# Patient Record
Sex: Female | Born: 1940 | Race: White | Hispanic: No | Marital: Married | Smoking: Never smoker
Health system: Southern US, Community
[De-identification: ages and names within clinical notes are randomized; demographics above are authoritative.]

## PROBLEM LIST (undated history)

## (undated) DIAGNOSIS — K5792 Diverticulitis of intestine, part unspecified, without perforation or abscess without bleeding: Secondary | ICD-10-CM

## (undated) DIAGNOSIS — N8110 Cystocele, unspecified: Secondary | ICD-10-CM

## (undated) DIAGNOSIS — R609 Edema, unspecified: Secondary | ICD-10-CM

## (undated) DIAGNOSIS — E785 Hyperlipidemia, unspecified: Secondary | ICD-10-CM

## (undated) DIAGNOSIS — C50919 Malignant neoplasm of unspecified site of unspecified female breast: Secondary | ICD-10-CM

## (undated) DIAGNOSIS — M75100 Unspecified rotator cuff tear or rupture of unspecified shoulder, not specified as traumatic: Secondary | ICD-10-CM

## (undated) DIAGNOSIS — I499 Cardiac arrhythmia, unspecified: Secondary | ICD-10-CM

## (undated) DIAGNOSIS — I480 Paroxysmal atrial fibrillation: Secondary | ICD-10-CM

## (undated) DIAGNOSIS — R002 Palpitations: Secondary | ICD-10-CM

## (undated) DIAGNOSIS — C439 Malignant melanoma of skin, unspecified: Secondary | ICD-10-CM

## (undated) DIAGNOSIS — E876 Hypokalemia: Secondary | ICD-10-CM

## (undated) DIAGNOSIS — I493 Ventricular premature depolarization: Secondary | ICD-10-CM

## (undated) DIAGNOSIS — Z90710 Acquired absence of both cervix and uterus: Secondary | ICD-10-CM

## (undated) DIAGNOSIS — E039 Hypothyroidism, unspecified: Secondary | ICD-10-CM

## (undated) DIAGNOSIS — M19219 Secondary osteoarthritis, unspecified shoulder: Secondary | ICD-10-CM

## (undated) DIAGNOSIS — B029 Zoster without complications: Secondary | ICD-10-CM

## (undated) DIAGNOSIS — G629 Polyneuropathy, unspecified: Secondary | ICD-10-CM

## (undated) DIAGNOSIS — A3790 Whooping cough, unspecified species without pneumonia: Secondary | ICD-10-CM

## (undated) DIAGNOSIS — R0789 Other chest pain: Secondary | ICD-10-CM

## (undated) DIAGNOSIS — C73 Malignant neoplasm of thyroid gland: Secondary | ICD-10-CM

## (undated) DIAGNOSIS — C801 Malignant (primary) neoplasm, unspecified: Secondary | ICD-10-CM

## (undated) DIAGNOSIS — K219 Gastro-esophageal reflux disease without esophagitis: Secondary | ICD-10-CM

## (undated) DIAGNOSIS — Z780 Asymptomatic menopausal state: Secondary | ICD-10-CM

## (undated) DIAGNOSIS — I10 Essential (primary) hypertension: Secondary | ICD-10-CM

## (undated) HISTORY — DX: Polyneuropathy, unspecified: G62.9

## (undated) HISTORY — DX: Essential (primary) hypertension: I10

## (undated) HISTORY — DX: Malignant neoplasm of unspecified site of unspecified female breast: C50.919

## (undated) HISTORY — DX: Edema, unspecified: R60.9

## (undated) HISTORY — DX: Hypothyroidism, unspecified: E03.9

## (undated) HISTORY — DX: Secondary osteoarthritis, unspecified shoulder: M19.219

## (undated) HISTORY — DX: Hypokalemia: E87.6

## (undated) HISTORY — DX: Malignant melanoma of skin, unspecified: C43.9

## (undated) HISTORY — DX: Asymptomatic menopausal state: Z78.0

## (undated) HISTORY — DX: Acquired absence of both cervix and uterus: Z90.710

## (undated) HISTORY — PX: COLONOSCOPY: SHX174

## (undated) HISTORY — DX: Hyperlipidemia, unspecified: E78.5

## (undated) HISTORY — DX: Zoster without complications: B02.9

## (undated) HISTORY — DX: Ventricular premature depolarization: I49.3

## (undated) HISTORY — DX: Other chest pain: R07.89

## (undated) HISTORY — DX: Diverticulitis of intestine, part unspecified, without perforation or abscess without bleeding: K57.92

## (undated) HISTORY — DX: Cardiac arrhythmia, unspecified: I49.9

## (undated) HISTORY — DX: Malignant neoplasm of thyroid gland: C73

## (undated) HISTORY — DX: Palpitations: R00.2

## (undated) HISTORY — DX: Paroxysmal atrial fibrillation: I48.0

## (undated) HISTORY — DX: Unspecified rotator cuff tear or rupture of unspecified shoulder, not specified as traumatic: M75.100

## (undated) HISTORY — DX: Cystocele, unspecified: N81.10

## (undated) HISTORY — DX: Malignant (primary) neoplasm, unspecified: C80.1

## (undated) HISTORY — PX: THYROIDECTOMY: SHX17

## (undated) HISTORY — DX: Gastro-esophageal reflux disease without esophagitis: K21.9

## (undated) HISTORY — DX: Whooping cough, unspecified species without pneumonia: A37.90

## (undated) HISTORY — PX: NASAL SINUS SURGERY: SHX719

---

## 1989-02-22 DIAGNOSIS — C439 Malignant melanoma of skin, unspecified: Secondary | ICD-10-CM

## 1989-02-22 HISTORY — DX: Malignant melanoma of skin, unspecified: C43.9

## 1989-02-22 HISTORY — PX: MELANOMA EXCISION: SHX5266

## 1997-08-02 ENCOUNTER — Ambulatory Visit: Admission: RE | Admit: 1997-08-02 | Discharge: 1997-08-02 | Payer: Self-pay | Admitting: Cardiology

## 1998-02-22 HISTORY — PX: MASTECTOMY: SHX3

## 1998-07-14 ENCOUNTER — Ambulatory Visit (HOSPITAL_COMMUNITY): Admission: RE | Admit: 1998-07-14 | Discharge: 1998-07-14 | Payer: Self-pay | Admitting: Cardiology

## 1998-07-16 ENCOUNTER — Other Ambulatory Visit: Admission: RE | Admit: 1998-07-16 | Discharge: 1998-07-16 | Payer: Self-pay | Admitting: Cardiology

## 1999-01-01 ENCOUNTER — Encounter: Payer: Self-pay | Admitting: Obstetrics and Gynecology

## 1999-01-06 ENCOUNTER — Encounter (INDEPENDENT_AMBULATORY_CARE_PROVIDER_SITE_OTHER): Payer: Self-pay

## 1999-01-06 ENCOUNTER — Inpatient Hospital Stay (HOSPITAL_COMMUNITY): Admission: RE | Admit: 1999-01-06 | Discharge: 1999-01-07 | Payer: Self-pay | Admitting: Obstetrics and Gynecology

## 1999-08-05 ENCOUNTER — Encounter: Admission: RE | Admit: 1999-08-05 | Discharge: 1999-08-05 | Payer: Self-pay | Admitting: Surgery

## 1999-08-05 ENCOUNTER — Encounter: Payer: Self-pay | Admitting: Surgery

## 1999-08-19 ENCOUNTER — Ambulatory Visit (HOSPITAL_BASED_OUTPATIENT_CLINIC_OR_DEPARTMENT_OTHER): Admission: RE | Admit: 1999-08-19 | Discharge: 1999-08-19 | Payer: Self-pay | Admitting: Surgery

## 1999-08-19 ENCOUNTER — Encounter: Payer: Self-pay | Admitting: Surgery

## 1999-08-19 ENCOUNTER — Encounter (INDEPENDENT_AMBULATORY_CARE_PROVIDER_SITE_OTHER): Payer: Self-pay | Admitting: *Deleted

## 1999-08-27 ENCOUNTER — Encounter: Admission: RE | Admit: 1999-08-27 | Discharge: 1999-11-25 | Payer: Self-pay

## 1999-09-15 ENCOUNTER — Encounter: Payer: Self-pay | Admitting: Surgery

## 1999-09-15 ENCOUNTER — Encounter (INDEPENDENT_AMBULATORY_CARE_PROVIDER_SITE_OTHER): Payer: Self-pay | Admitting: Specialist

## 1999-09-16 ENCOUNTER — Inpatient Hospital Stay (HOSPITAL_COMMUNITY): Admission: AD | Admit: 1999-09-16 | Discharge: 1999-09-17 | Payer: Self-pay | Admitting: Surgery

## 1999-10-21 ENCOUNTER — Encounter: Admission: RE | Admit: 1999-10-21 | Discharge: 1999-10-21 | Payer: Self-pay | Admitting: Oncology

## 1999-10-21 ENCOUNTER — Encounter: Payer: Self-pay | Admitting: Oncology

## 1999-11-26 ENCOUNTER — Encounter: Admission: RE | Admit: 1999-11-26 | Discharge: 1999-11-26 | Payer: Self-pay | Admitting: Oncology

## 1999-11-26 ENCOUNTER — Encounter: Payer: Self-pay | Admitting: Oncology

## 1999-12-16 ENCOUNTER — Ambulatory Visit (HOSPITAL_BASED_OUTPATIENT_CLINIC_OR_DEPARTMENT_OTHER): Admission: RE | Admit: 1999-12-16 | Discharge: 1999-12-16 | Payer: Self-pay | Admitting: Plastic Surgery

## 2000-02-23 HISTORY — PX: OTHER SURGICAL HISTORY: SHX169

## 2000-04-19 ENCOUNTER — Encounter: Payer: Self-pay | Admitting: Surgery

## 2000-04-19 ENCOUNTER — Encounter: Admission: RE | Admit: 2000-04-19 | Discharge: 2000-04-19 | Payer: Self-pay | Admitting: Surgery

## 2000-10-03 ENCOUNTER — Ambulatory Visit (HOSPITAL_BASED_OUTPATIENT_CLINIC_OR_DEPARTMENT_OTHER): Admission: RE | Admit: 2000-10-03 | Discharge: 2000-10-03 | Payer: Self-pay | Admitting: Orthopedic Surgery

## 2000-11-21 ENCOUNTER — Ambulatory Visit (HOSPITAL_BASED_OUTPATIENT_CLINIC_OR_DEPARTMENT_OTHER): Admission: RE | Admit: 2000-11-21 | Discharge: 2000-11-21 | Payer: Self-pay | Admitting: Orthopedic Surgery

## 2001-04-21 ENCOUNTER — Encounter: Payer: Self-pay | Admitting: Surgery

## 2001-04-21 ENCOUNTER — Encounter: Admission: RE | Admit: 2001-04-21 | Discharge: 2001-04-21 | Payer: Self-pay | Admitting: Surgery

## 2001-06-26 ENCOUNTER — Other Ambulatory Visit: Admission: RE | Admit: 2001-06-26 | Discharge: 2001-06-26 | Payer: Self-pay | Admitting: Obstetrics and Gynecology

## 2002-04-25 ENCOUNTER — Encounter: Payer: Self-pay | Admitting: Surgery

## 2002-04-25 ENCOUNTER — Encounter: Admission: RE | Admit: 2002-04-25 | Discharge: 2002-04-25 | Payer: Self-pay | Admitting: Surgery

## 2002-08-10 ENCOUNTER — Ambulatory Visit (HOSPITAL_COMMUNITY): Admission: RE | Admit: 2002-08-10 | Discharge: 2002-08-10 | Payer: Self-pay | Admitting: Internal Medicine

## 2002-08-10 ENCOUNTER — Encounter: Payer: Self-pay | Admitting: Internal Medicine

## 2002-10-26 ENCOUNTER — Ambulatory Visit (HOSPITAL_COMMUNITY): Admission: RE | Admit: 2002-10-26 | Discharge: 2002-10-26 | Payer: Self-pay | Admitting: Gastroenterology

## 2002-11-06 ENCOUNTER — Emergency Department (HOSPITAL_COMMUNITY): Admission: EM | Admit: 2002-11-06 | Discharge: 2002-11-06 | Payer: Self-pay | Admitting: Emergency Medicine

## 2002-11-06 ENCOUNTER — Encounter: Payer: Self-pay | Admitting: Emergency Medicine

## 2003-05-03 ENCOUNTER — Encounter: Admission: RE | Admit: 2003-05-03 | Discharge: 2003-05-03 | Payer: Self-pay | Admitting: Internal Medicine

## 2004-01-14 ENCOUNTER — Ambulatory Visit (HOSPITAL_COMMUNITY): Admission: RE | Admit: 2004-01-14 | Discharge: 2004-01-14 | Payer: Self-pay | Admitting: Internal Medicine

## 2004-05-08 ENCOUNTER — Encounter: Admission: RE | Admit: 2004-05-08 | Discharge: 2004-05-08 | Payer: Self-pay | Admitting: Internal Medicine

## 2004-06-04 ENCOUNTER — Ambulatory Visit: Payer: Self-pay | Admitting: Oncology

## 2005-02-22 HISTORY — PX: CYSTOSCOPY: SUR368

## 2005-02-26 ENCOUNTER — Encounter: Admission: RE | Admit: 2005-02-26 | Discharge: 2005-02-26 | Payer: Self-pay | Admitting: Cardiology

## 2005-02-26 ENCOUNTER — Encounter: Payer: Self-pay | Admitting: Cardiology

## 2005-05-11 ENCOUNTER — Encounter: Admission: RE | Admit: 2005-05-11 | Discharge: 2005-05-11 | Payer: Self-pay | Admitting: Surgery

## 2005-05-26 ENCOUNTER — Encounter: Admission: RE | Admit: 2005-05-26 | Discharge: 2005-05-26 | Payer: Self-pay | Admitting: Cardiology

## 2005-10-18 ENCOUNTER — Encounter: Admission: RE | Admit: 2005-10-18 | Discharge: 2005-10-18 | Payer: Self-pay | Admitting: Surgery

## 2006-01-18 ENCOUNTER — Ambulatory Visit (HOSPITAL_BASED_OUTPATIENT_CLINIC_OR_DEPARTMENT_OTHER): Admission: RE | Admit: 2006-01-18 | Discharge: 2006-01-18 | Payer: Self-pay | Admitting: Urology

## 2006-06-01 ENCOUNTER — Encounter: Admission: RE | Admit: 2006-06-01 | Discharge: 2006-06-01 | Payer: Self-pay | Admitting: Cardiology

## 2006-08-05 ENCOUNTER — Encounter: Admission: RE | Admit: 2006-08-05 | Discharge: 2006-08-05 | Payer: Self-pay | Admitting: Surgery

## 2006-08-18 ENCOUNTER — Encounter: Admission: RE | Admit: 2006-08-18 | Discharge: 2006-08-18 | Payer: Self-pay | Admitting: Dermatology

## 2006-08-19 ENCOUNTER — Encounter (INDEPENDENT_AMBULATORY_CARE_PROVIDER_SITE_OTHER): Payer: Self-pay | Admitting: Neurological Surgery

## 2006-08-19 ENCOUNTER — Ambulatory Visit (HOSPITAL_COMMUNITY): Admission: RE | Admit: 2006-08-19 | Discharge: 2006-08-19 | Payer: Self-pay | Admitting: Neurological Surgery

## 2006-09-07 ENCOUNTER — Encounter: Admission: RE | Admit: 2006-09-07 | Discharge: 2006-09-07 | Payer: Self-pay | Admitting: Internal Medicine

## 2007-03-31 ENCOUNTER — Other Ambulatory Visit: Admission: RE | Admit: 2007-03-31 | Discharge: 2007-03-31 | Payer: Self-pay | Admitting: Obstetrics & Gynecology

## 2007-04-21 ENCOUNTER — Inpatient Hospital Stay (HOSPITAL_BASED_OUTPATIENT_CLINIC_OR_DEPARTMENT_OTHER): Admission: RE | Admit: 2007-04-21 | Discharge: 2007-04-21 | Payer: Self-pay | Admitting: Cardiology

## 2007-04-21 HISTORY — PX: CARDIAC CATHETERIZATION: SHX172

## 2007-04-28 ENCOUNTER — Encounter: Admission: RE | Admit: 2007-04-28 | Discharge: 2007-04-28 | Payer: Self-pay | Admitting: Cardiology

## 2007-08-09 ENCOUNTER — Encounter: Admission: RE | Admit: 2007-08-09 | Discharge: 2007-08-09 | Payer: Self-pay | Admitting: Surgery

## 2008-08-13 ENCOUNTER — Encounter: Admission: RE | Admit: 2008-08-13 | Discharge: 2008-08-13 | Payer: Self-pay | Admitting: Surgery

## 2009-03-13 DIAGNOSIS — K219 Gastro-esophageal reflux disease without esophagitis: Secondary | ICD-10-CM | POA: Insufficient documentation

## 2009-03-13 DIAGNOSIS — R609 Edema, unspecified: Secondary | ICD-10-CM | POA: Insufficient documentation

## 2009-03-13 DIAGNOSIS — E039 Hypothyroidism, unspecified: Secondary | ICD-10-CM | POA: Insufficient documentation

## 2009-03-13 DIAGNOSIS — C73 Malignant neoplasm of thyroid gland: Secondary | ICD-10-CM | POA: Insufficient documentation

## 2009-03-13 DIAGNOSIS — M1990 Unspecified osteoarthritis, unspecified site: Secondary | ICD-10-CM | POA: Insufficient documentation

## 2009-08-20 ENCOUNTER — Encounter: Admission: RE | Admit: 2009-08-20 | Discharge: 2009-08-20 | Payer: Self-pay | Admitting: Surgery

## 2009-10-07 DIAGNOSIS — C50919 Malignant neoplasm of unspecified site of unspecified female breast: Secondary | ICD-10-CM | POA: Insufficient documentation

## 2009-10-07 DIAGNOSIS — C439 Malignant melanoma of skin, unspecified: Secondary | ICD-10-CM | POA: Insufficient documentation

## 2010-02-05 ENCOUNTER — Ambulatory Visit: Payer: Self-pay | Admitting: Cardiology

## 2010-04-08 DIAGNOSIS — E876 Hypokalemia: Secondary | ICD-10-CM | POA: Insufficient documentation

## 2010-06-25 ENCOUNTER — Telehealth: Payer: Self-pay | Admitting: Cardiology

## 2010-06-25 NOTE — Telephone Encounter (Signed)
Called in to say that thursdays and mondays would be fine to schedule for Dr. Isac Sarna for her.

## 2010-07-07 NOTE — Op Note (Signed)
NAMEHELYNE, Christina [name]                ACCOUNT NO.:  [id number]   MEDICAL RECORD NO.:  [id number]          PATIENT TYPE:  AMB   LOCATION:  SDS                          FACILITY:  [facility]   PHYSICIAN:  Tia Alert, MD     DATE OF BIRTH:  1940-03-20   DATE OF PROCEDURE:  08/19/2006  DATE OF DISCHARGE:                               OPERATIVE REPORT   PREOPERATIVE DIAGNOSIS:  Left frontal skull lesion.   POSTOPERATIVE DIAGNOSIS:  Left frontal skull lesion.   PROCEDURE:  Excision of left frontal skull lesion.   SURGEON:  Tia Alert, M.D.   ANESTHESIA:  Local.   COMPLICATIONS:  None apparent.   INDICATIONS FOR PROCEDURE:  Ms. Hutmacher is a 70 year old female who has  had a long history of a left frontal skull lesion.  She feels like it  becomes tender at times and has grown somewhat over the last little  while. She had a CT scan which showed a hyperostotic left frontal skull  lesion.  This appeared to be external to the skull. Her dermatologist  felt this was going to be a cystic lesion opened a small incision and  found this to be a bony lesion and, therefore, referred her for  neurosurgical care.  I recommended excision of the lesion.  She  understood the risks, benefits, expected outcome and wished to proceed.   DESCRIPTION OF PROCEDURE:  The patient was taken to the operating room  and her left frontal region was prepped with DuraPrep and draped in the  usual sterile fashion.  Her old sutures were removed and her incision  was then injected with a local anesthetic and then opened with a 15  blade scalpel.  The lesion was identified and the soft tissues were  removed and then a Leksell rongeur was used to grab the lesion, twist  the lesion, and it came off in a single piece.  I found no further bony  ingrowth. This appeared to be a very benign looking lesion. The soft  tissues looked healthy.  I, therefore, closed the soft tissues with  interrupted 3-0 Vicryl and closed the skin  with Benzoin and Steri-  Strips.  The drapes removed.  A sterile dressing was applied.  The  patient was then taken to the recovery room in stable condition.  At the  end of the procedure, all sponge, needle and instrument counts were  correct.      Tia Alert, MD  Electronically Signed     DSJ/MEDQ  D:  08/19/2006  T:  08/20/2006  Job:  [id number]

## 2010-07-07 NOTE — H&P (Signed)
NAMECANDICE, TOBEY NO.:  [id number]   MEDICAL RECORD NO.:  [id number]           PATIENT TYPE:   LOCATION:                                 FACILITY:   PHYSICIAN:  Colleen Can. Deborah Chalk, M.D.DATE OF BIRTH:  [birth year]   DATE OF ADMISSION:  04/21/2007  DATE OF DISCHARGE:                              HISTORY & PHYSICAL   CHIEF COMPLAINT:  Chest pain.   HISTORY OF PRESENT ILLNESS:  Ms. Christina [name] is a pleasant 70 year old white  female who has had a known history of hypertension as well as paroxysmal  atrial fibrillation.  She has had past episodes of chest pain in the  past and had a negative Cardiolite study in June of 2006.  She presents  for diagnostic cardiac catheterization due to pain and pressure on the  left side of her chest.  She has had some radiation into the left arm.  There has been some exertional component as well.  She has noted this  over the course of the past 4 months but has certainly had increasing  episodes here over the last several days.  She has had radiation of her  discomfort in the left arm down to the left elbow.  She has been placed  on Prilosec with no significant improvement.  She was seen by general  surgery because her chest discomfort is on the side of her previous  mastectomy.  This was not felt to be related to surgical incision.  She  is now referred for cardiac catheterization.   PAST MEDICAL HISTORY:  1. History of  atypical _________.  2. History of atrial fibrillation, paroxysmal.  3. Bilateral ankle surgery.  4. Left mastectomy for breast cancer in 2000.  5. History of thyroid cancer with thyroidectomy, subsequently on      replacement.  6. 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  7. History of hysterectomy.  8. Previous skin cancer excisions  9. Hypertension.   ALLERGIES:  DAYPRO AND TEGRETOL.   CURRENT MEDICATIONS:  1. Potassium 20 mEq tablets.  She takes 40 mEq 4 days a week, 60 mEq 3      days a  week.  2. Levoxyl 0.2 four days a week and 0.1 three days a week.  3. Atenolol 25 a day.  4. Triamterene hydrochlorothiazide 75-50 daily.  5. Multivitamin daily.  6. Calcium 600 mg a day.  7. Baby aspirin daily.  8. Diovan 160 a day.  9. Zocor 40 a day.  10.Fish oil 4 capsules daily.  11.Glucosamine 3 capsules daily.  12.Prilosec daily.  13.Vitamin D 400 international units daily.  14.Ocuvite daily.   Both of her parents are living in their 87s and have had hypertension.   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  She has no history of tobacco use.  She has rare social alcohol use.  She lives at home with her husband.   REVIEW OF SYSTEMS:  Her chest discomfort is described as above.  It is  described as a pressure-like sensation.  She has  also had some  concurrent dizzy spells as well as worsening of her arrhythmia.  Otherwise, Review of __________   Standing heart rate 49, respirations 18.  She is afebrile.  SKIN:  Is warm and dry.  Color is unremarkable.  LUNGS:  Are basically clear.  HEART:  Shows a regular rhythm.  ABDOMEN:  Soft.  EXTREMITIES:  Without edema.  NEUROLOGIC:  Shows no gross focal deficits.   PERTINENT LABORATORY DATA:  Are pending.  Her EKG does show T-wave  inversion in V1, V2 and V3.   OVERALL IMPRESSION:  1. Chest discomfort.  2. Hypertension.  3. Paroxysmal fibrillation.  4. History of breast cancer status post mastectomy.  5. History of thyroid cancer status post thyroidectomy.   PLAN:  Will proceed on with diagnostic cardiac catheterization.  The  procedure risks and benefits have all been explained, and she is willing  to proceed on Friday, April 21, 2007.      Sharlee Blew, N.P.      Colleen Can. Deborah Chalk, M.D.  Electronically Signed    LC/MEDQ  D:  04/20/2007  T:  04/20/2007  Job:  [id number]   cc:   Loraine Leriche A. Perini, M.D.  Currie Paris, M.D.

## 2010-07-07 NOTE — Cardiovascular Report (Signed)
[name], [name] NO.:  [id number]   MEDICAL RECORD NO.:  [id number]          PATIENT TYPE:  OIB   LOCATION:  1967                         FACILITY:  [facility]   PHYSICIAN:  Colleen Can. Deborah Chalk, M.D.DATE OF BIRTH:  June 11, 1940   DATE OF PROCEDURE:  DATE OF DISCHARGE:                            CARDIAC CATHETERIZATION   HISTORY:  [name] is a 70 year old female with a history of  hypertension and paroxysmal atrial fibrillation.  She has had previous  left mastectomy as well as a history of previous thyroid cancer.  She  also had a remote history of melanoma removed the left leg in 1991.  She  presents with substernal chest discomfort and pressure that radiates her  left arm.  It had a clear-cut exertional component.  It is been present  over the last 4 months but certainly has been increasing over the last  several days.  She had a negative stress Cardiolite in June of 2006.   PROCEDURE:  Left/right catheterization with selective coronary  angiography, left ventricular angiography.   TYPE AND SITE OF ENTRY:  Percutaneous right femoral artery.   CATHETERS:  A 4-French initially 4 curved left coronary catheter, but  subsequent 5 curved left coronary catheter, 4-French 3-D RCA catheter, 4-  French pigtail ventriculographic catheter.   CONTRAST:  Pure Omnipaque.   MEDICATIONS:  Given prior to procedure Valium 10 mg.   MEDICATIONS:  Given during the procedure Versed 3 mg IV.   COMMENT:  The patient tolerated the procedure well.   HEMODYNAMIC DATA:  The aortic pressure was 102/57, LV was 104/05-9.  There is no aortic valve gradient.   ANGIOGRAPHIC DATA:  1. Left main coronary artery:  The left main coronary artery is      normal.  2. Left anterior descending:  The left anterior descending has very      minimal to minor irregularities.  There are 2 small to moderate      size diagonal vessels.  Left anterior descending is otherwise      normal.  3. Left  circumflex:  The left circumflex has minor irregularities.  It      has essentially a bifurcating obtuse marginal vessel.  It is      essentially normal.  4. Right coronary artery:  The right coronary artery is a large      dominant vessel.  There is a large posterolateral branch.  The      distal RV branch extends to the distal inferior wall and there are      2 smaller inferior branches that supply the basal portion of the      heart.  The right coronary artery is normal.   Left ventricular angiogram was performed in the RAO position.  Overall  cardiac size and silhouette are normal.  Global ejection fraction is  estimated be 60%.  There was no mitral regurgitation, intracardiac  calcification or intracavitary filling defect.  The aortic root appears  to be slightly increased in size.  Coronary sinuses appear to be normal.   OVERALL IMPRESSION:  1. Normal left  ventricular function.  2. Normal coronary arteries.  3. There is borderline increase in the aortic root.   DISCUSSION:  In is felt that the coronary anatomy is normal.  In light  of the multiple different malignancies that [name] has had, I  think it is reasonable to do a follow-up CT scan of her chest.  This  will be arranged on an outpatient basis.      Colleen Can. Deborah Chalk, M.D.  Electronically Signed     SNT/MEDQ  D:  04/21/2007  T:  04/22/2007  Job:  [id number]

## 2010-07-10 NOTE — Discharge Summary (Signed)
[place]. [facility]  Patient:    Christina [name], Christina [name]                       MRN: [id number] Adm. Date:  [id number] Disc. Date: [id number] Attending:  Charlton Haws CC:         Currie Paris, M.D.                           Discharge Summary  ADMISSION DIAGNOSIS:  Left breast carcinoma.  DISCHARGE DIAGNOSIS:  Left breast carcinoma.  OPERATION PERFORMED:  Left complete mastectomy with sentinel node biopsy, and immediate reconstruction with a left-sided TRAM flap.  CHIEF COMPLAINT:  "I have breast cancer in my left breast."  HISTORY OF PRESENT ILLNESS:  The patient is a 70 year old woman, who is a Engineer, civil (consulting) at [employer] with a long history of bilateral fibrocytic disease.  The patient was doing self exam earlier in the month of July when she found a breast lump in her left breast.  She underwent a mammogram which revealed that breast lump was fibrocystic disease, but that she had some new microcalcifications on the left side.  The patient underwent an open biopsy on June 27 which returned intraductal carcinoma with microinvasion.  She has previously had a breast lump removed in 1989 which was benign.  The patient was advised regarding her options for treatment and decided to proceed with a left-sided mastectomy and sentinel node biopsy.  She wishes to proceed also with immediate reconstruction.  PAST MEDICAL HISTORY:  Includes thyroid cancer in 1988 which was treated with thyroidectomy followed by repeat thyroidectomy and radioactive iodine.  The patient had a malignant melanoma in her left medial thigh in 1989.  The patient also had a basal cell carcinoma on her buttocks, which was recently removed.  The patient has mitral valve prolapse, which has been responsive for occasional irregular beat.  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  PAST SURGICAL HISTORY:  Thyroidectomies x 2 in 1988, vaginal hysterectomy and appendectomy  in 1970.  MEDICATIONS:  Digoxin, Premarin, Maxzide, Atenolol, Synthroid, Neurontin, K-Dur, and multivitamins.  SOCIAL HISTORY:  The patient denies smoking.  She does take an occasional drink.  She has one child, age 21.  She is married to her second husband.  Her previous husband died of pulmonary embolus.  ALLERGIES:  Include TEGRETOL and DAYPRO.  REVIEW OF SYSTEMS:  The patient denies any problems with chronic headaches, dizziness, seizure disorder, chest pain, shortness of breath, dyspnea on exertion, blood in her stool, blood in her urine, chronic diarrhea, chronic constipation, or dysuria.  She is oriented x 3.  PHYSICAL EXAMINATION:  Please see admission history and physical for complete physical examination.  ADMISSION LABORATORY VALUES:  Include a thyroid stimulating hormone of 0.192 which is slightly low.  She has a normal urinalysis.  White count is 9700, hemoglobin 13.1, hematocrit 38.3.  Her electrolytes are normal with the exception of a slightly elevated CO2 of 33.  The patient had a chest x-ray which was negative for any active disease. Cardiogram revealed no acute changes, sinus bradycardia which is felt to be a digoxin effect.  HOSPITAL COURSE:  On the day of admission, the patient was taken to the operating room where she underwent a left completion mastectomy and sentinel node biopsy of three lymph nodes.  The frozen section on the three lymph nodes returned negative for malignancy.  Following mastectomy, the patient underwent a left-sided TRAM flap.  Her postoperative course has been completely uneventful.  On the first postoperative day she was started on a diet and advanced to a regular diet on the second postoperative day.  The patient has had a minimal amount of pain and only one episode of nausea which responded to Phenergan.  The patient is now ambulating with minimal assistance.  She has three drains remaining, all of which are draining greater than 30  cc for 24 hours.  They will be left in place on discharge.  All dressing removed and wound inspected.  Abdominal wound looks good, healing nicely.  Breast flap is soft, good color.  It is smaller than the opposite side.  DISCHARGE INSTRUCTIONS:  Includes no heavy lifting, no strenuous activity, no driving.  She understands that she will need to empty her drains on a twice needed basis.  Followup provided in four days in my office for removal of drains.  DISCHARGE MEDICATIONS: 1. Tylox one or two q.4h. p.r.n. pain. 2. Keflex 500 mg q.i.d. for 5 days.  SPECIAL INSTRUCTIONS:  The patient also understands to call if there are any questions or concerns. DD:  09/17/99 TD:  09/20/99 Job: [id number]

## 2010-07-10 NOTE — Op Note (Signed)
Christina [name], CONSTANTINE NO.:  [id number]   MEDICAL RECORD NO.:  [id number]           PATIENT TYPE:   LOCATION:                                 FACILITY:   PHYSICIAN:  Jamison Neighbor, M.D.       DATE OF BIRTH:   DATE OF PROCEDURE:  01/18/2006  DATE OF DISCHARGE:                               OPERATIVE REPORT   PREOPERATIVE DIAGNOSIS:  Cystocele.   POSTOPERATIVE DIAGNOSIS:  Cystocele.   PROCEDURE:  Cystoscopy, anterior repair of cystocele with mesh.   SURGEON:  Jamison Neighbor, M.D.   ANESTHESIA:  General.   COMPLICATIONS:  None.   DRAINS:  None.   BRIEF HISTORY:  This 70 year old female has a grade 4 cystocele  prolapsing out beyond the introitus.  She has good support for the  vaginal vault.  She does not have enterocele or rectocele and was not  found to have stress incontinence.  The patient is now to undergo  anterior repair of the cystocele with mesh insertion.  The patient  understands the risks and benefits of the procedure and gave full  informed consent.   PROCEDURE:  After successful induction of general anesthesia the patient  was placed in the dorsal lithotomy position, prepped with Betadine and  draped in the usual sterile fashion.  Careful bimanual examination  revealed that she still has a cystocele prolapsing out beyond the  introitus.  The bladder neck itself appears well-supported.  The vaginal  vault supported and the patient does not have significant rectocele.  The anterior vaginal mucosa was infiltrated with local anesthetic, an  incision was made from the top of cystocele all way back to the vaginal  cuff.  The urethra and bladder neck itself were not mobilized.  Posterior section elevated the flap of vaginal mucosa all way back to  the endopelvic fascia which was mobilized.  The entire cystocele was  mobilized and was reduced with a series of mattress sutures of 2-0  Vicryl.  The patient then had stab incision was made at the  level of the  umbilicus, in the groin crease directly over the obturator fossa.  A  second incision made posteriorly approximately 3.5 cm towards the  rectum.  The 4 ligature carrier was then passed through the incision  under finger guidance or passed into the vagina.  Care was taken to  ensure the vaginal mucosa was not injured in any way.  Cystoscopy was  performed with both 12 degree and 70 degree lenses.  There was no injury  to the bladder.  The mesh was then positioned and pulled into place.  Repeat cystoscopy showed the bladder neck had not been altered and there  was still good support.  The mesh was then trimmed and positioned.  The  vaginal mucosa was trimmed slightly and closed with a running suture of  2-0 Vicryl.  This was done after irrigation of the entire area with  antibiotic solution.  This showed excellent support for the bladder with  good reduction of the cystocele.  Repeat cystoscopy was performed one  more time to ensure that the bladder neck had not been over corrected  and appeared that it had not been altered in any way.  A final  inspection showed the bladder had not been injured.  The arms of the  mesh were then cut off at the skin level.  Hemostasis obtained with electrocautery.  The skin was closed with  Dermabond.  The patient had had packing placed, a Foley catheter was  used to remove any cystoscopy fluids but was left out.  The patient will  be allowed to go home with Tylox and doxycycline if she voids, otherwise  she can stay for 23-hour observation.           ______________________________  Jamison Neighbor, M.D.  Electronically Signed     RJE/MEDQ  D:  01/18/2006  T:  01/18/2006  Job:  [id number]   cc:   M. Leda Quail, MD  Fax: [phone]

## 2010-07-10 NOTE — Op Note (Signed)
[place]. [facility]  Patient:    Christina [name], Christina [name]                   MRN: [id number] Proc. Date: 08/19/99 Adm. Date:  [id number] Attending:  Charlton Haws                           Operative Report  CCS# [phone]  PREOPERATIVE DIAGNOSIS:  Breast calcifications, left.  POSTOPERATIVE DIAGNOSIS:  Breast calcifications, left.  OPERATION PERFORMED:  Excision, left breast calcification.  SURGEON:  Currie Paris, M.D.  ANESTHESIA:  MAC.  INDICATIONS FOR PROCEDURE:  The patient is a 70 year old who has had a problem with multiple breast cysts who recently had a mammogram showing breast calcifications that were modestly suspicious.  DESCRIPTION OF PROCEDURE:  The patient was brought to the operating room having had a guide wire placed adjacent to the calcifications which were fairly superficial and located at about the 5 to 6 oclock position of the left breast about two fingerbreadths from the areolar margin.  After satisfactory sedation had been obtained, the breast was prepped and draped and anesthetized with 1% Xylocaine.  A curvilinear incision was made and the subcutaneous tissues divided.  Excisional biopsy was taken around the guide wire going predominantly laterally and inferior to the wire since the calcifications were oriented thus to the guide wire.  Two large cysts were encountered and partially excised and the base was cauterized.  The specimen was sent for specimen mammography.  Bleeding was controlled with cautery and once everything was dry it was closed with 3-0 Vicryl followed by 4-0 Monocryl subcuticular plus Steri-Strips.  Radiology reported that the calcifications were contained in the specimen.  The patient tolerated the procedure well. There were no operative complications.  All counts were correct. DD:  08/19/99 TD:  08/20/99 Job: [id number]

## 2010-07-10 NOTE — Op Note (Signed)
[place]. [facility]  Patient:    Christina [name], Christina [name]                       MRN: [id number] Proc. Date: 10/03/00 Adm. Date:  [id number] Attending:  Twana First                           Operative Report  PREOPERATIVE DIAGNOSIS:  Left tarsal tunnel syndrome.  POSTOPERATIVE DIAGNOSIS:  Left tarsal tunnel syndrome.  PROCEDURE:  Left tarsal tunnel release.  SURGEON:  Dr. Salvatore Marvel.  ASSISTANT:  Kirstin Tomasa Rand, P.A.  ANESTHESIA:  General.  OPERATIVE TIME:  40 minutes.  COMPLICATIONS:  None.  INDICATIONS FOR PROCEDURE:  [name] is a 70 year old woman who has had significant bilateral tarsal tunnel syndrome, left worse than right, longstanding, increasing in nature with EMG ______ evaluation documenting this who has failed conservative care and is now to undergo left tarsal tunnel release.  DESCRIPTION OF PROCEDURE:  [name] was brought to the operating room on October 03, 2000, and placed on the operating room table in the supine position. After an adequate level of general anesthesia was obtained her left foot and leg was prepped using sterile Betadine and draped using sterile technique.  Foot and leg was exsanguinated and a calf tourniquet elevated 300 mm.  Initially through a 7 cm curvilinear based posterior to the medial malleolus initial exposure was made.  The underlying subcutaneous tissues were incised in line with the skin incision.  The fascia over the tarsal tunnel and the tarsal tunnel itself was exposed proximally to the medial malleolus and carefully incised revealing the underlying posterior tibial nerve and artery. This was carefully protected while the entire tarsal tunnel was released distally passed the branch point of the posterior tibial nerve to the calcaneus.  There was found to be significant compression but no other pathology noted. The posterior tibial tendon was not disturbed.  After this release was carried  out and it was felt that a full release had been achieved then the tourniquet was released.  Only small muscular bleeders and superficial bleeders were cauterized but no extensive bleeding was encountered and the artery was found to be intact and pulsating well.  The wound was copiously irrigated and closed with 2-0 Vicryl and skin staples.  The wound was injected with 0.25% Marcaine and sterile dressings and a posterior splint applied, then the patient awakened, and taken to recovery room in stable condition.  FOLLOWUP CARE: [name] needs to be followed as an outpatient on Vicodin for pain.  See her back in the office in a week for wound check and follow up. DD:  10/03/00 TD:  10/03/00 Job: [id number]

## 2010-07-10 NOTE — Op Note (Signed)
[place]. [facility]  Patient:    DAANYA, LANPHIER                   MRN: [id number] Proc. Date: 09/15/99 Adm. Date:  [id number] Disc. Date: [id number] Attending:  Charlton Haws                           Operative Report  ACCOUNT:  [id number]  PREOPERATIVE DIAGNOSIS:  Diffuse ductal carcinoma in situ, left breast.  POSTOPERATIVE DIAGNOSIS:  Diffuse ductal carcinoma in situ, left breast.  OPERATION:  Left total mastectomy with sentinel node dissection and blue dye injection.  SURGEON:  Currie Paris, M.D.  ANESTHESIA:  General endotracheal.  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  It was decided to proceed to a total mastectomy with a sentinel node dissection.  DESCRIPTION OF PROCEDURE:  The patient is brought to the operating room after a  satisfactory general anesthesia had been obtained.  The breasts and abdomen were prepped as a single large sterile field.  Blue dye was injected around the nipple/subareolar complex, and massaged for five minutes.  Using the needle probe, I identified a hot area in the axilla, and marked the skin over this.  The superior portion of the incision was made and a skin flap was raised medially to the sternum and ________ to the clavicle, and out into the axilla.  I was then able to see a bleeding lymph node and put the probe on it.  It was quite hot.  grasped it with a Babcock and excised it.  Using the probe, I found another node that was hot and excised it, and also had just a little bit of blue color to it.  I used the probe again to see if the area was clear and found one more area that as hot.  With a little further deeper dissection, I found a third node that was blue, and excised it, and it was also hot.  Once this was removed,  there were no further significant counts in the axilla, indicating we had cleared the sentinel nodes ut. These were sent for pathology and subsequently returned negative.  While awaiting for the sentinel node report, I made the inferior incision, raising the inferior flap to the rectus, and then laterally to the latissimus.  The breast was removed, starting medially and working laterally, taking the fascia.  This as done primarily with cautery.  Once we got to the axilla, we had the report back  that the nodes were negative, so I simply divided the remaining attachments of breast into the axilla with cautery, leaving the axilla and any further dissection alone, and did not do any deeper resection into the axilla.  The wound was checked for hemostasis and irrigated, and appeared to be dry.  _____ clips on some vessels in the axilla when I was doing the sentinel nodes.  A pack was placed and Dr. Lacretia Nicks. Delia Chimes came in to do the TRAM flap.  The patient tolerated this portion of the procedure nicely.  The estimated blood loss was 75 cc. There were no complications. DD:  09/15/99 TD:  09/16/99 Job: [id number]

## 2010-07-10 NOTE — Op Note (Signed)
[place]. [facility]  Patient:    Christina [name], Christina [name]                       MRN: [id number] Proc. Date: 09/15/99 Adm. Date:  [id number] Disc. Date: [id number] Attending:  Charlton Haws                           Operative Report  PREOPERATIVE DIAGNOSES: 1. Left breast carcinoma. 2. ______  abscess left breast.  POSTOPERATIVE DIAGNOSES: 1. Left breast carcinoma. 2. ______ abscess left breast.  OPERATION PERFORMED:  Immediate breast reconstruction utilizing left transverse rectus abdominis myocutaneous flap.  SURGEON:  Dr. Benna Dunks.  ASSISTANT:  Dr. Fran Lowes.  ANESTHESIA:  General endotracheal anesthesia.  INDICATIONS FOR SURGERY:  This is a 70 year old woman with left breast carcinoma.  She was counseled regarding the treatment options, and opted to undergo a left complete mastectomy.  She was also counseled about the potential for breast reconstruction.  She has opted to undergo immediate breast reconstruction with tram flap.  She was counseled about the potential risk of partial or complete loss of the flap, being made too large, being made too small, the need for secondary breast reconstruction, abdominal wound healing problems, abdominal hernias, bulkiness or weakness, unsightly scarring, vascular compromise of the umbilicus, protracted drainage, seroma, hematoma, the need for transfusion.  These and other risks were discussed with the patient prior to surgery.  In spite of these risks she wishes to proceed with the operation.  DESCRIPTION OF PROCEDURE:  A day prior to surgery skin marks were placed outlining the dimensions of the tram flap.  The patient was counseled in the office that she had inadequate amount of tissue to reconstruct her breast of equal size.  In spite of that, she decided to proceed.  Following the completion of a complete mastectomy, I was summoned to the operating room. The mastectomy defect site was inspected and  irrigated.  A 10 mm Blake drain was placed in the axillary gutter on the left side and brought through a separate stab incision.  A circular incision was made around the umbilicus, and the umbilicus dissected away from the skin paddle of the tram flap, leaving a sufficient amount of fatty tissue on the cuff to ensure vascular integrity.  The upper skin paddle mark was incised and deepened through the skin and subcutaneous tissue until reaching the abdominal wall fascia.  The abdominal flap was elevated at the level of the anterior abdominal wall fascia up to the level of the costal margins bilaterally and the xiphoid in the midline.  I continued to dissect in a tunnel up to the medial inferior corner of the mastectomy defect until connecting with the mastectomy defect.  The patient was temporarily placed in a semi ______ position to ensure that I could bring the abdomen together in my anticipated skin mark inferiorly.  Once I was certain that I could close her without significant tension, she was placed back in the supine position, and the lower skin paddle incision was made.  Incision was deepened until reaching the anterior abdominal wall fascia.  I opted to use an epsilateral muscle.  The right corner of the skin paddle was now elevated off the anterior abdominal wall fascia from lateral to medial direction until crossing the linea alba for a distance of 1 cm to the left of midline.  The left corner of the skin paddle was  elevated off the anterior abdominal wall fascia until reaching the lateral rectus perforators. This left approximately 3 cm of connection to the anterior rectus fascia for the skin paddle.  Two parallel incisions were now made in the anterior rectus fascia beginning at the costal margin superiorly on the left side, and carrying these two parallel incisions inferiorly until reaching the skin paddle.  These incisions were approximately 3 cm apart.  The medial of the  two incisions was continued inferiorly, skirting along the medial connection of the skin paddle to the anterior rectus fascia.  The lateral anterior rectus fascia was continued inferiorly skirting along the lateral connection of the skin paddle.  The two incisions were connected inferiorly at the inferior connection of the skin paddles to the anterior rectus fascia.  The medial and lateral anterior rectus fascia was now dissected off the rectus muscle.  The strip of rectus fascia over the middle of the rectus muscle was left intact. The deep surface of the rectus muscle was now dissected off the posterior rectus fascia.  This completely freed the rectus muscle with the exception of inferior and superior connections.  The rectus muscle was now divided at the level of the arcuate line.  The gastric vessels were visualized.  These vessels were individually dissected and clipped with a hemoclip and then divided between the hemoclips.  The right lateral corner of the skin paddle was excised, removing approximately 5 to 6 cm.  The left corner of the skin paddle was excised removing approximately 3 cm.  The skin paddle along with its connecting rectus muscle was tunneled up to the mastectomy defect without difficulty and temporarily fixed in position.  The abdominal wound was copiously irrigated with saline irrigation.  Hemostasis was ensured using electrocautery.  The anterior rectus fascia was closed using multiple interrupted buried figure-of-eight 0 Prolene sutures.  Marlex mesh was then placed over the rectus fascia closure using onlay piece of mesh approximately 8 cm in width and 15 cm in height.  It was fixed in position using a 2-0 Prolene running around the periphery of the Marlex mesh.  An opening was made in the middle of the Marlex mesh, and the umbilicus was brought through the new opening.  Two Blake drains were placed in the abdominal wound, and brought out through separate stab  incisions.  The patient was placed in a semi- ______ position with the knees flexed and the back raised to approximately 30 to 35 degrees.  The abdominal wound was closed by approximating the dermis of  the incision using interrupted 2-0 Vicryl suture.  Prior to complete closure, a new opening was made for the umbilicus, and the umbilicus was brought out through this new opening and fixed in position using multiple interrupted 3-0 Vicryl sutures for the dermis.  The remainder of the abdominal wound was now closed using multiple interrupted 2-0 Vicryl sutures for the dermis. Attention was now directed to the breast.  It was noted to be markedly venous congested.  The temporary staples were removed, and the tram flap was inspected.  A Doppler was used to ensure that there was a pulsatile flow through the superior epigastric vessels.  There was in fact good pulsatile flow.  The venous congestion suddenly cleared.  It was then inset.  The upper portion of the skin paddle was placed in the desired position, and the portion of the skin paddle covered by the superior breast flap was de-epithelialized. The inferior skin paddle was fixed to  the inferior breast flap using multiple interrupted 3-0 Vicryl sutures.  The superior breast flap was sutured to the superior cut edge of the skin paddle using a running 3-0 Vicryl subcuticular suture.  The flap was significantly smaller than the opposite breast, probably 25% smaller.  The flap was also suspended from the superior dimensions of the mastectomy dissection using multiple interrupted 3-0 Vicryl suture.  This was done prior to closing the superior skin paddle incision.  Following the surgery, the abdomen and chest were cleansed.  Steri-Strips were applied along the abdominal wound and the breast wound.  Light dressings were applied.  The skin paddle appeared to be slightly venous congested, but with excellent capillary refill, although somewhat brisk.   It was slightly cool, and it was felt that the venous channel suggest not open at this point.  The patient was awakened, extubated, and transported to the recovery room in satisfactory condition. DD:  09/15/99 TD:  09/16/99 Job: [id number]

## 2010-07-10 NOTE — Op Note (Signed)
[place]. [facility]  Patient:    Christina [name], Christina [name]                       MRN: [id number] Proc. Date: 12/16/99 Adm. Date:  [id number] Attending:  Loura Halt Ii                           Operative Report  PREOPERATIVE DIAGNOSES: 1. Left breast cancer. 2. Acquired absence, left breast.  POSTOPERATIVE DIAGNOSES: 1. Left breast cancer. 2. Acquired absence, left breast.  PROCEDURE:  Left nipple reconstruction.  SURGEON:  Alfredia Ferguson, M.D.  ANESTHESIA:  None required.  INDICATION FOR SURGERY:  This is a 70 year old woman who is status post left mastectomy for breast cancer.  She underwent immediate breast reconstruction with a TRAM flap.  The TRAM flap has matured nicely and looks good.  She is now ready for nipple reconstruction.  The patient understands the risk of failure of the nipple due to vascular compromise.  She understands that it will be large at first and then will shrink significantly over the coming months.  In spite of these risks, the patient wished to proceed with the surgery.  DESCRIPTION OF PROCEDURE:  With the patient in the sitting position, location of the nipple was chosen.  The patient was then placed in a supine position and a 42 mm diameter circle was drawn around the location of the nipple. Within the confines of this nipple, a tripartate flap was designed, which was inferiorly-based, leaving a random skin bridge intact.  The three points of the flap were pointing to the 9 oclock, 3 oclock, and 12 oclock position. After prepping and draping the breast, the tripartate flap was incised and elevated.  The tips of each of the three pointed flaps were excised to create blunt ends.  The flap pointing at 3 oclock and the flap pointing at the 9 oclock position were then sewn to each other at the blunt end and fixed with 4-0 chromic suture.  The 12 oclock flap was then placed down on top of this cylinder in order to close  the cylinder completely and was also fixed using 4-0 chromic suture.  The donor site was closed by approximating the dermis using interrupted 4-0 Vicryl suture.  The skin edges of the donor site were closed with interrupted 4-0 chromic suture.  Viability of the nipple flap was excellent.  The area was cleansed and a bulky dressing was applied.  The patient was discharged to home in satisfactory condition. DD:  12/16/99 TD:  12/16/99 Job: [id number]

## 2010-07-10 NOTE — Op Note (Signed)
[place]. [facility]  Patient:    Christina [name], Christina [name] Visit Number: [id number] MRN: [id number]          Service Type: DSU Location: [facility] Attending Physician:  Twana First Dictated by:   Elana Alm Thurston Hole, M.D. Proc. Date: 11/21/00 Admit Date:  11/21/2000                             Operative Report  PREOPERATIVE DIAGNOSIS:  Right tarsal tunnel syndrome.  POSTOPERATIVE DIAGNOSIS:  Right tarsal tunnel syndrome.  OPERATION:  Right tarsal tunnel release.  SURGEON:  Elana Alm. Thurston Hole, M.D.  ASSISTANT:  Julien Girt, P.A.  ANESTHESIA:  General  OPERATIVE TIME:  30 minutes  COMPLICATIONS:  None.  INDICATION FOR PROCEDURE:  Christina [name] is a 70 year old woman who has had significant bilateral tarsal tunnel syndrome.  She underwent left tarsal tunnel release approximately eight weeks ago and is now to undergo right tarsal tunnel release.  She has otherwise failed conservative care.  DESCRIPTION OF PROCEDURE:  Christina [name] is brought to the operating room on November 21, 2000 and placed on the operating table in the supine position. After an adequate level of general anesthesia was obtained, her right foot and leg was prepped using sterile Betadine and draped using sterile technique. Leg was exsanguinated and a calf tourniquet elevated at 300 mmHg. Initially through a 7 cm posterior lateral incision based behind the posterior medial malleolus, initial exposure was made.  The underlying subcutaneous tissues were incised in line with the skin incision.  The tarsal tunnel was found and then carefully unroofed revealing an underlying compressed posterior tibial nerve and artery which were carefully dissected and released from the overlying tarsal canal.  The tarsal canal sheath was completely released distally past the branch points of the medial and lateral calcaneal nerves. Other than significant compression, no other pathology was noted.   The posterior tibial tendon remained in its sheath and was not disturbed.  After this was done, it was felt that a significant and complete release had been carried out.  The wound was then copiously irrigated. Tourniquet was released. Hemostasis obtained with cautery.  The wound was then closed with 3-0 Vicryl and skin staples.  The wound was injected with 0.25% Marcaine.  Sterile dressings and a posterior splint were applied and then the patient awakened and taken to the recovery room in stable condition.  FOLLOW UP CARE: Christina [name] will be followed as an outpatient on Vicodin for pain.  See her back in the office in a week for wound check and follow up. Dictated by:   Elana Alm Thurston Hole, M.D. Attending Physician:  Twana First DD:  11/21/00 TD:  11/21/00 Job: [id number]

## 2010-07-13 ENCOUNTER — Other Ambulatory Visit: Payer: Self-pay | Admitting: Internal Medicine

## 2010-07-13 DIAGNOSIS — Z1231 Encounter for screening mammogram for malignant neoplasm of breast: Secondary | ICD-10-CM

## 2010-07-31 ENCOUNTER — Encounter: Payer: Self-pay | Admitting: Cardiovascular Disease

## 2010-08-03 ENCOUNTER — Encounter: Payer: Self-pay | Admitting: Cardiovascular Disease

## 2010-08-03 ENCOUNTER — Ambulatory Visit (INDEPENDENT_AMBULATORY_CARE_PROVIDER_SITE_OTHER): Payer: Medicare Other | Admitting: Cardiovascular Disease

## 2010-08-03 VITALS — BP 124/80 | HR 58 | Ht 69.75 in | Wt 155.4 lb

## 2010-08-03 DIAGNOSIS — G5750 Tarsal tunnel syndrome, unspecified lower limb: Secondary | ICD-10-CM | POA: Insufficient documentation

## 2010-08-03 DIAGNOSIS — I480 Paroxysmal atrial fibrillation: Secondary | ICD-10-CM

## 2010-08-03 DIAGNOSIS — I4891 Unspecified atrial fibrillation: Secondary | ICD-10-CM

## 2010-08-03 DIAGNOSIS — Z85820 Personal history of malignant melanoma of skin: Secondary | ICD-10-CM | POA: Insufficient documentation

## 2010-08-03 DIAGNOSIS — Z85850 Personal history of malignant neoplasm of thyroid: Secondary | ICD-10-CM | POA: Insufficient documentation

## 2010-08-03 DIAGNOSIS — I10 Essential (primary) hypertension: Secondary | ICD-10-CM

## 2010-08-03 DIAGNOSIS — Z853 Personal history of malignant neoplasm of breast: Secondary | ICD-10-CM

## 2010-08-03 NOTE — Progress Notes (Signed)
   Patient ID: Christina [name], female    DOB: August 16, 1940, 70 y.o.   MRN: [id number]  HPI    Review of Systems    Physical Exam

## 2010-08-03 NOTE — Assessment & Plan Note (Signed)
Stable. Continue digoxin and beta blocker. Will get echo to assess LV function. If she continues to exhibit episodes of atrial fib, will need to wear an event monitor and be considered for coumadin. She understands the risk of stroke. Her CHADS score is 0-1 (well controlled HTN) and her CHADS-VASC score is 2 (age over 37 and female gender). I believe that she would benefit in terms of prevention from stroke by being on Pradaxa or coumadin for anticoagulation. Will discuss further at next visit. Continue ASA for now.

## 2010-08-03 NOTE — Progress Notes (Signed)
History of Present Illness: 70 yo WF with history of paroxysmal atrial fibrillation, HTN and atypical chest pain who is here for routine cardiac follow up.  She has been followed in the past by Dr. Delfin Edis. Her A. Fib comes and goes and she states that when she has an episode she feels tightness in the chest and neck, shakiness, and palpitations.  She states that when she feels these symptoms she gets up and walks or takes deep breaths and can get it to stop.  Her last episode was last week.  She has only had to visit the emergency room once in the last year. No recent echo. She has not worn a monitor in the past. She has not been on coumadin.   She is followed by Dr. Waynard Edwards for her primary care and states that she has been taking her medications as prescribed.    Past Medical History  Diagnosis Date  . Arrhythmia     HISTORY OF PAROXYSMAL AFIB   . Hypertension   . Atypical chest pain   . H/O: hysterectomy   . Melanoma 1991    REMOVED FROM LEFT LEG IN 1991  . Breast cancer     H/O LEFT MASTECTOMY IN 2000  . Thyroid cancer     s/p THYROIDECTOMY    Past Surgical History  Procedure Date  . Cardiac catheterization 04/21/07    GLOBAL ESTIMATED EF IS 60 %; NORMAL LV FUNCTION; NORMAL CORONARY ARTERIES; BORDERLINE INCREASE IN THE AORTIC ROOT  . Mastectomy 2000    LEFT BREAST  . Melanoma excision 1991    REMOVED FROM LEFT LEG  . Thyroidectomy   . Cystoscopy 2007    anterior repair of cystocele with mesh  .  tarsal tunnel release 2002    Right/Left  tarsal tunnel release    Current Outpatient Prescriptions  Medication Sig Dispense Refill  . aspirin 81 MG EC tablet Take 81 mg by mouth daily.        . Calcium Carbonate (CALCIUM 600 PO) Take 1 tablet by mouth daily.        . Cholecalciferol (VITAMIN D3) 400 UNITS CAPS Take 1 capsule by mouth daily.        . digoxin (LANOXIN) 0.125 MG tablet Take 125 mcg by mouth daily.        . fish oil-omega-3 fatty acids 1000 MG capsule Take 4 g by  mouth daily.        . Glucosamine 500 MG TABS Take 3 tablets by mouth daily.        Marland Kitchen levothyroxine (LEVOXYL) 200 MCG tablet Take 200 mcg by mouth daily. Take one tablet daily for 5 days a week and 1/2 tablet the other 2 days.      . Multiple Vitamin (MULTIVITAMIN PO) Take 1 tablet by mouth daily.        . Multiple Vitamins-Minerals (OCUVITE PO) Take by mouth.        . nebivolol (BYSTOLIC) 2.5 MG tablet Take 2.5 mg by mouth daily.        . potassium chloride SA (K-DUR,KLOR-CON) 20 MEQ tablet Take 20 mEq by mouth. Take one tablet bid 4 days a week and 3 times daily for three days a week      . simvastatin (ZOCOR) 40 MG tablet Take 40 mg by mouth at bedtime.        . triamterene-hydrochlorothiazide (MAXZIDE) 75-50 MG per tablet Take 1 tablet by mouth daily.        Marland Kitchen  DISCONTD: atenolol (TENORMIN) 25 MG tablet Take 25 mg by mouth daily.        Marland Kitchen DISCONTD: omeprazole (PRILOSEC) 10 MG capsule Take 20 mg by mouth daily.        Marland Kitchen DISCONTD: valsartan (DIOVAN) 160 MG tablet Take 160 mg by mouth daily.          Allergies  Allergen Reactions  . Daypro (Oxaprozin)   . Tegretol (Carbamazepine)     History   Social History  . Marital Status: Married    Spouse Name: N/A    Number of Children: N/A  . Years of Education: N/A   Occupational History  . RETIRED [employer]   Social History Main Topics  . Smoking status: Never Smoker   . Smokeless tobacco: Not on file  . Alcohol Use: Yes     RARE  . Drug Use: No  . Sexually Active:    Other Topics Concern  . 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 Hypertension Father   . Macular degeneration Father     Review of Systems:  As stated in the HPI and otherwise negative.   BP 124/80  Pulse 58  Ht 5' 9.75" (1.772 m)  Wt 155 lb 6.4 oz  (70.489 kg)  BMI 22.46 kg/m2  Physical Examination: General: Well developed, well nourished, NAD HEENT: OP clear, mucus membranes moist SKIN: warm, dry. No rashes. Neuro: No focal deficits Musculoskeletal: Muscle strength 5/5 all ext Psychiatric: Mood and affect normal Neck: No JVD, no carotid bruits, no thyromegaly, no lymphadenopathy. Lungs:Clear bilaterally, no wheezes, rhonci, crackles Cardiovascular: bradycardic rate and rhythm. No murmurs, gallops or rubs. Abdomen:Soft. Bowel sounds present. Non-tender.  Extremities: No lower extremity edema. Pulses are 2 + in the bilateral DP/PT.  EKG:    Sinus bradycardia, rate 58

## 2010-08-03 NOTE — Patient Instructions (Addendum)
Your physician recommends that you schedule a follow-up appointment in: 6 months with Dr. Clifton James. Your physician has requested that you have an echocardiogram. Echocardiography is a painless test that uses sound waves to create images of your heart. It provides your doctor with information about the size and shape of your heart and how well your heart's chambers and valves are working. This procedure takes approximately one hour. There are no restrictions for this procedure. Your physician recommends that you continue on your current medications as directed. Please refer to the Current Medication list given to you today.

## 2010-08-20 ENCOUNTER — Ambulatory Visit (HOSPITAL_COMMUNITY): Payer: Medicare Other | Attending: Cardiovascular Disease

## 2010-08-20 DIAGNOSIS — I480 Paroxysmal atrial fibrillation: Secondary | ICD-10-CM

## 2010-08-20 DIAGNOSIS — I079 Rheumatic tricuspid valve disease, unspecified: Secondary | ICD-10-CM | POA: Insufficient documentation

## 2010-08-20 DIAGNOSIS — I4891 Unspecified atrial fibrillation: Secondary | ICD-10-CM

## 2010-08-20 DIAGNOSIS — I059 Rheumatic mitral valve disease, unspecified: Secondary | ICD-10-CM | POA: Insufficient documentation

## 2010-08-20 DIAGNOSIS — I379 Nonrheumatic pulmonary valve disorder, unspecified: Secondary | ICD-10-CM | POA: Insufficient documentation

## 2010-08-20 DIAGNOSIS — I10 Essential (primary) hypertension: Secondary | ICD-10-CM | POA: Insufficient documentation

## 2010-08-24 ENCOUNTER — Ambulatory Visit
Admission: RE | Admit: 2010-08-24 | Discharge: 2010-08-24 | Disposition: A | Discharge status: Home / Self Care | Payer: Medicare Other | Source: Ambulatory Visit | Attending: Internal Medicine | Admitting: Internal Medicine

## 2010-08-24 DIAGNOSIS — Z1231 Encounter for screening mammogram for malignant neoplasm of breast: Secondary | ICD-10-CM

## 2010-09-14 ENCOUNTER — Other Ambulatory Visit: Payer: Self-pay | Admitting: *Deleted

## 2010-09-14 MED ORDER — NEBIVOLOL HCL 5 MG PO TABS: ORAL_TABLET | ORAL | Status: DC

## 2010-09-14 NOTE — Telephone Encounter (Signed)
escribe medication per fax request  

## 2010-12-09 LAB — BASIC METABOLIC PANEL
BUN: 35 — ABNORMAL HIGH
CO2: 29
Calcium: 9.4
Creatinine, Ser: 1.6 — ABNORMAL HIGH
GFR calc Af Amer: 39 — ABNORMAL LOW
Glucose, Bld: 96

## 2010-12-09 LAB — CBC
MCHC: 33.4
MCV: 86.2
Platelets: 203
RDW: 14.7 — ABNORMAL HIGH

## 2011-01-06 DIAGNOSIS — R809 Proteinuria, unspecified: Secondary | ICD-10-CM | POA: Insufficient documentation

## 2011-01-06 DIAGNOSIS — E785 Hyperlipidemia, unspecified: Secondary | ICD-10-CM | POA: Insufficient documentation

## 2011-01-06 DIAGNOSIS — Z2839 Other underimmunization status: Secondary | ICD-10-CM | POA: Insufficient documentation

## 2011-03-04 ENCOUNTER — Ambulatory Visit: Payer: Medicare Other | Admitting: Cardiovascular Disease

## 2011-03-05 ENCOUNTER — Other Ambulatory Visit: Payer: Self-pay | Admitting: Otolaryngology

## 2011-03-08 ENCOUNTER — Ambulatory Visit
Admission: RE | Admit: 2011-03-08 | Discharge: 2011-03-08 | Disposition: A | Discharge status: Home / Self Care | Payer: Medicare Other | Source: Ambulatory Visit | Attending: Otolaryngology | Admitting: Otolaryngology

## 2011-03-11 ENCOUNTER — Ambulatory Visit (INDEPENDENT_AMBULATORY_CARE_PROVIDER_SITE_OTHER): Payer: Medicare Other | Admitting: Cardiovascular Disease

## 2011-03-11 ENCOUNTER — Encounter: Payer: Self-pay | Admitting: Cardiovascular Disease

## 2011-03-11 ENCOUNTER — Encounter (INDEPENDENT_AMBULATORY_CARE_PROVIDER_SITE_OTHER): Payer: Medicare Other

## 2011-03-11 VITALS — BP 133/77 | HR 80 | Ht 69.0 in | Wt 153.0 lb

## 2011-03-11 DIAGNOSIS — I4891 Unspecified atrial fibrillation: Secondary | ICD-10-CM

## 2011-03-11 DIAGNOSIS — I480 Paroxysmal atrial fibrillation: Secondary | ICD-10-CM

## 2011-03-11 NOTE — Patient Instructions (Signed)
Your physician wants you to follow-up in:  6 months.  You will receive a reminder letter in the mail two months in advance. If you don't receive a letter, please call our office to schedule the follow-up appointment.  Your physician has recommended that you wear an event monitor. Event monitors are medical devices that record the heart's electrical activity. Doctors most often us these monitors to diagnose arrhythmias. Arrhythmias are problems with the speed or rhythm of the heartbeat. The monitor is a small, portable device. You can wear one while you do your normal daily activities. This is usually used to diagnose what is causing palpitations/syncope (passing out).   

## 2011-03-11 NOTE — Assessment & Plan Note (Addendum)
She is feeling palpitations every day. Given her age and HTN, here is a risk of CVA with PAF. I have discussed anticoagulation and risk of CVA in detail. Will get her to wear a 21 day monitor. If evidence of atrial fibrillation, will need Pradaxa or Xarelto for anti-coagulation. Continue beta blocker and digoxin.

## 2011-03-11 NOTE — Progress Notes (Signed)
History of Present Illness: [name] with history of paroxysmal atrial fibrillation, HTN and atypical chest pain who is here for routine cardiac follow up. She has been followed in the past by Dr. Delfin Edis. I saw her for the first time in June 2012. Echo June 2012 normal. Her A. Fib comes and goes and she states that when she has an episode she feels tightness in the chest and neck, shakiness, and palpitations. She states that when she feels these symptoms she gets up and walks or takes deep breaths and can get it to stop.  She has not been on coumadin in the past. Her CHADS score is 0-1 (well controlled HTN) and her CHADS-VASC score is 2 (age over 21 and female gender). I believe that she would benefit in terms of prevention from stroke by being on Pradaxa or coumadin for anticoagulation but she has not wanted to consider this in the past.   She tells me that she has been battling a sinus infection for 5-6 weeks. She has been on antibiotics. She has mild palpitations every day. No long runs. No chest pain or SOB. No near syncope or syncope. No chest pain or SOB.   She is followed by Dr. Waynard Edwards for her primary care and states that she has been taking her medications as prescribed.    Past Medical History  Diagnosis Date  . Arrhythmia     HISTORY OF PAROXYSMAL AFIB   . Hypertension   . Atypical chest pain   . H/O: hysterectomy   . Melanoma 1991    REMOVED FROM LEFT LEG IN 1991  . Breast cancer     H/O LEFT MASTECTOMY IN 2000  . Thyroid cancer     s/p THYROIDECTOMY    Past Surgical History  Procedure Date  . Cardiac catheterization 04/21/07    GLOBAL ESTIMATED EF IS 60 %; NORMAL LV FUNCTION; NORMAL CORONARY ARTERIES; BORDERLINE INCREASE IN THE AORTIC ROOT  . Mastectomy 2000    LEFT BREAST  . Melanoma excision 1991    REMOVED FROM LEFT LEG  . Thyroidectomy   . Cystoscopy 2007    anterior repair of cystocele with mesh  .  tarsal tunnel release 2002    Right/Left  tarsal tunnel  release    Current Outpatient Prescriptions  Medication Sig Dispense Refill  . aspirin 81 MG EC tablet Take 81 mg by mouth daily.        . Calcium Carbonate (CALCIUM 600 PO) Take 1 tablet by mouth daily.        . cholecalciferol (VITAMIN D) 1000 UNITS tablet Take 1,000 Units by mouth daily.      . digoxin (LANOXIN) 0.125 MG tablet Take 125 mcg by mouth daily.        . fish oil-omega-3 fatty acids 1000 MG capsule Take 4 g by mouth daily.        . Glucosamine 500 MG TABS Take 3 tablets by mouth daily.        Marland Kitchen levothyroxine (LEVOXYL) 200 MCG tablet Take 200 mcg by mouth daily. Take one tablet daily for 5 days a week and 1/2 tablet the other 2 days.      . Multiple Vitamin (MULTIVITAMIN PO) Take 1 tablet by mouth daily.        . Multiple Vitamins-Minerals (OCUVITE PO) Take by mouth.        . nebivolol (BYSTOLIC) 5 MG tablet Take 1/2 tab daily      . potassium  chloride SA (K-DUR,KLOR-CON) 20 MEQ tablet Take 20 mEq by mouth. Take one tablet bid 4 days a week and 3 times daily for three days a week      . simvastatin (ZOCOR) 40 MG tablet Take 40 mg by mouth at bedtime.        . triamterene-hydrochlorothiazide (MAXZIDE) 75-50 MG per tablet Take 1 tablet by mouth daily.          Allergies  Allergen Reactions  . Daypro (Oxaprozin)   . Tegretol (Carbamazepine)     History   Social History  . Marital Status: Married    Spouse Name: N/A    Number of Children: N/A  . Years of Education: N/A   Occupational History  . RETIRED [employer]   Social History Main Topics  . Smoking status: Never Smoker   . Smokeless tobacco: Not on file  . Alcohol Use: Yes     RARE  . Drug Use: No  . Sexually Active:    Other Topics Concern  . Not on file   Social History Narrative   RETIRED FROM [employer] @ HOME WITH HER HUSBANDSHE HAS CO-AUTHORED BOOKS ON IV ADDITIVESETOH RARELYNON-SMOKER    Family History  Problem Relation Age of Onset  . Hypertension  Father   . Macular degeneration Father     Review of Systems:  As stated in the HPI and otherwise negative.   BP 133/77  Pulse 80  Ht 5\' 9"  (1.753 m)  Wt 153 lb (69.4 kg)  BMI 22.59 kg/m2  Physical Examination: General: Well developed, well nourished, NAD HEENT: OP clear, mucus membranes moist SKIN: warm, dry. No rashes. Neuro: No focal deficits Musculoskeletal: Muscle strength 5/5 all ext Psychiatric: Mood and affect normal Neck: No JVD, no carotid bruits, no thyromegaly, no lymphadenopathy. Lungs:Clear bilaterally, no wheezes, rhonci, crackles Cardiovascular: Regular rate and rhythm. No murmurs, gallops or rubs. Abdomen:Soft. Bowel sounds present. Non-tender.  Extremities: No lower extremity edema. Pulses are 2 + in the bilateral DP/PT.   Echo 08/20/10:  Left ventricle: The cavity size was normal. Wall thickness was normal. Systolic function was normal. The estimated ejection fraction was in the range of 55% to 65%. Wall motion was normal; there were no regional wall motion abnormalities. Left ventricular diastolic function parameters were normal. - Atrial septum: No defect or patent foramen ovale was identified. - Pulmonary arteries: PA peak pressure: 31mm Hg (S).

## 2011-03-17 ENCOUNTER — Other Ambulatory Visit: Payer: Self-pay

## 2011-03-17 MED ORDER — DIGOXIN 125 MCG PO TABS: 125.0000 ug | ORAL_TABLET | Freq: Every day | ORAL | Status: DC

## 2011-04-06 ENCOUNTER — Telehealth: Payer: Self-pay | Admitting: *Deleted

## 2011-04-06 NOTE — Telephone Encounter (Signed)
I called pt to review monitor results. Left message to call back 

## 2011-04-07 NOTE — Telephone Encounter (Signed)
Fu call °Pt returning your call  °

## 2011-04-07 NOTE — Telephone Encounter (Signed)
Spoke with pt and reviewed monitor results with her.  

## 2011-04-13 ENCOUNTER — Telehealth: Payer: Self-pay | Admitting: Cardiovascular Disease

## 2011-04-13 NOTE — Telephone Encounter (Signed)
LOV,Echo,12,Monitor faxed to [facility] @ [phone] 04/13/11/KM

## 2011-04-14 ENCOUNTER — Other Ambulatory Visit: Payer: Self-pay | Admitting: Otolaryngology

## 2011-07-16 ENCOUNTER — Other Ambulatory Visit: Payer: Self-pay | Admitting: Internal Medicine

## 2011-07-16 DIAGNOSIS — Z1231 Encounter for screening mammogram for malignant neoplasm of breast: Secondary | ICD-10-CM

## 2011-08-03 ENCOUNTER — Other Ambulatory Visit: Payer: Self-pay | Admitting: Otolaryngology

## 2011-08-27 ENCOUNTER — Ambulatory Visit: Payer: Medicare Other

## 2011-08-30 ENCOUNTER — Ambulatory Visit
Admission: RE | Admit: 2011-08-30 | Discharge: 2011-08-30 | Disposition: A | Discharge status: Home / Self Care | Payer: Medicare Other | Source: Ambulatory Visit | Attending: Internal Medicine | Admitting: Internal Medicine

## 2011-08-30 DIAGNOSIS — Z1231 Encounter for screening mammogram for malignant neoplasm of breast: Secondary | ICD-10-CM

## 2011-09-10 ENCOUNTER — Encounter: Payer: Self-pay | Admitting: Cardiology

## 2011-09-14 ENCOUNTER — Ambulatory Visit (INDEPENDENT_AMBULATORY_CARE_PROVIDER_SITE_OTHER): Payer: Medicare Other | Admitting: Cardiovascular Disease

## 2011-09-14 ENCOUNTER — Encounter: Payer: Self-pay | Admitting: Cardiovascular Disease

## 2011-09-14 VITALS — BP 132/71 | HR 60 | Ht 69.0 in | Wt 154.0 lb

## 2011-09-14 DIAGNOSIS — I480 Paroxysmal atrial fibrillation: Secondary | ICD-10-CM

## 2011-09-14 DIAGNOSIS — I4891 Unspecified atrial fibrillation: Secondary | ICD-10-CM

## 2011-09-14 NOTE — Progress Notes (Signed)
History of Present Illness: [name] with history of paroxysmal atrial fibrillation, HTN and atypical chest pain who is here for routine cardiac follow up. She has been followed in the past by Dr. Delfin Edis. I saw her for the first time in June 2012. Echo June 2012 normal. Her A. Fib comes and goes and she states that when she has an episode she feels tightness in the chest and neck, shakiness, and palpitations. She states that when she feels these symptoms she gets up and walks or takes deep breaths and can get it to stop. She has not been on coumadin in the past. Her CHADS score is 0-1 (well controlled HTN) and her CHADS-VASC score is 2 (age over 65 and female gender). I believe that she would benefit in terms of prevention from stroke by being on Pradaxa or coumadin for anticoagulation but she has not wanted to consider this in the past.   She is here for follow up.  No chest pain or SOB. No near syncope or syncope. She is not aware of any palpitations. Her main issues have been dealing with a sinus infection which required sinus surgery.   She is followed by Dr. Waynard Edwards for her primary care and states that she has been taking her medications as prescribed.    Past Medical History  Diagnosis Date  . Arrhythmia     HISTORY OF PAROXYSMAL AFIB   . Hypertension   . Atypical chest pain   . H/O: hysterectomy   . Melanoma 1991    REMOVED FROM LEFT LEG IN 1991  . Breast cancer     H/O LEFT MASTECTOMY IN 2000  . Thyroid cancer     s/p THYROIDECTOMY    Past Surgical History  Procedure Date  . Cardiac catheterization 04/21/07    GLOBAL ESTIMATED EF IS 60 %; NORMAL LV FUNCTION; NORMAL CORONARY ARTERIES; BORDERLINE INCREASE IN THE AORTIC ROOT  . Mastectomy 2000    LEFT BREAST  . Melanoma excision 1991    REMOVED FROM LEFT LEG  . Thyroidectomy   . Cystoscopy 2007    anterior repair of cystocele with mesh  .  tarsal tunnel release 2002    Right/Left  tarsal tunnel release    Current  Outpatient Prescriptions  Medication Sig Dispense Refill  . aspirin 81 MG EC tablet Take 81 mg by mouth daily.        . Calcium Carbonate (CALCIUM 600 PO) Take 1 tablet by mouth daily.        . cholecalciferol (VITAMIN D) 1000 UNITS tablet Take 1,000 Units by mouth daily.      . digoxin (LANOXIN) 0.125 MG tablet Take 1 tablet (125 mcg total) by mouth daily.  30 tablet  6  . fish oil-omega-3 fatty acids 1000 MG capsule Take 4 g by mouth daily.        . Glucosamine 500 MG TABS Take 3 tablets by mouth daily.        Marland Kitchen levothyroxine (LEVOXYL) 200 MCG tablet Take 200 mcg by mouth daily. Take one tablet daily for 5 days a week and 1/2 tablet the other 2 days.      . Multiple Vitamin (MULTIVITAMIN PO) Take 1 tablet by mouth daily.        . Multiple Vitamins-Minerals (OCUVITE PO) Take by mouth.        . nebivolol (BYSTOLIC) 5 MG tablet Take 1/2 tab daily      . potassium chloride SA (K-DUR,KLOR-CON) 20  MEQ tablet Take 20 mEq by mouth. Take one tablet bid 4 days a week and 3 times daily for three days a week      . simvastatin (ZOCOR) 40 MG tablet Take 40 mg by mouth at bedtime.        . triamterene-hydrochlorothiazide (MAXZIDE) 75-50 MG per tablet Take 1 tablet by mouth daily.          Allergies  Allergen Reactions  . Daypro (Oxaprozin)   . Tegretol (Carbamazepine)     History   Social History  . Marital Status: Married    Spouse Name: N/A    Number of Children: N/A  . Years of Education: N/A   Occupational History  . RETIRED [employer]   Social History Main Topics  . Smoking status: Never Smoker   . Smokeless tobacco: Not on file  . Alcohol Use: Yes     RARE  . Drug Use: No  . Sexually Active:    Other Topics Concern  . Not on file   Social History Narrative   RETIRED FROM [employer] @ HOME WITH HER HUSBANDSHE HAS CO-AUTHORED BOOKS ON IV ADDITIVESETOH RARELYNON-SMOKER    Family History  Problem Relation Age of Onset  . Hypertension  Father   . Macular degeneration Father     Review of Systems:  As stated in the HPI and otherwise negative.   BP 132/71  Pulse 60  Ht 5\' 9"  (1.753 m)  Wt 154 lb (69.854 kg)  BMI 22.74 kg/m2  Physical Examination: General: Well developed, well nourished, NAD HEENT: OP clear, mucus membranes moist SKIN: warm, dry. No rashes. Neuro: No focal deficits Musculoskeletal: Muscle strength 5/5 all ext Psychiatric: Mood and affect normal Neck: No JVD, no carotid bruits, no thyromegaly, no lymphadenopathy. Lungs:Clear bilaterally, no wheezes, rhonci, crackles Cardiovascular: Regular rate and rhythm. No murmurs, gallops or rubs. Abdomen:Soft. Bowel sounds present. Non-tender.  Extremities: No lower extremity edema. Pulses are 2 + in the bilateral DP/PT.  EKG: NSR, rate 60 bpm. LAD. Non-specific ST changes.

## 2011-09-14 NOTE — Patient Instructions (Signed)
Your physician wants you to follow-up in: 1 year. You will receive a reminder letter in the mail two months in advance. If you don't receive a letter, please call our office to schedule the follow-up appointment.  

## 2011-09-14 NOTE — Assessment & Plan Note (Addendum)
No evidence of atrial fibrillation on event monitor in January 2013. EKG shows NSR today. She is having infrequent palpitations. She has refused anti-coagulation in the past. She will call if she notices any palpitations.

## 2011-09-23 ENCOUNTER — Other Ambulatory Visit: Payer: Self-pay | Admitting: Dermatology

## 2011-10-29 ENCOUNTER — Other Ambulatory Visit: Payer: Self-pay | Admitting: *Deleted

## 2011-10-29 MED ORDER — DIGOXIN 125 MCG PO TABS: 125.0000 ug | ORAL_TABLET | Freq: Every day | ORAL | Status: DC

## 2011-12-20 DIAGNOSIS — B029 Zoster without complications: Secondary | ICD-10-CM | POA: Insufficient documentation

## 2012-01-14 ENCOUNTER — Other Ambulatory Visit: Payer: Self-pay | Admitting: *Deleted

## 2012-01-14 MED ORDER — NEBIVOLOL HCL 5 MG PO TABS: 2.5000 mg | ORAL_TABLET | Freq: Every day | ORAL | Status: DC

## 2012-03-29 DIAGNOSIS — I890 Lymphedema, not elsewhere classified: Secondary | ICD-10-CM | POA: Insufficient documentation

## 2012-04-11 ENCOUNTER — Ambulatory Visit: Payer: Medicare Other | Attending: Internal Medicine | Admitting: Physical Therapy

## 2012-04-11 DIAGNOSIS — I890 Lymphedema, not elsewhere classified: Secondary | ICD-10-CM | POA: Insufficient documentation

## 2012-04-11 DIAGNOSIS — IMO0001 Reserved for inherently not codable concepts without codable children: Secondary | ICD-10-CM | POA: Insufficient documentation

## 2012-04-24 ENCOUNTER — Ambulatory Visit: Payer: Medicare Other | Attending: Internal Medicine

## 2012-04-24 DIAGNOSIS — I890 Lymphedema, not elsewhere classified: Secondary | ICD-10-CM | POA: Insufficient documentation

## 2012-04-24 DIAGNOSIS — IMO0001 Reserved for inherently not codable concepts without codable children: Secondary | ICD-10-CM | POA: Insufficient documentation

## 2012-04-26 ENCOUNTER — Ambulatory Visit: Payer: Medicare Other

## 2012-04-27 ENCOUNTER — Ambulatory Visit: Payer: Medicare Other

## 2012-04-28 ENCOUNTER — Ambulatory Visit: Payer: Medicare Other

## 2012-05-01 ENCOUNTER — Ambulatory Visit: Payer: Medicare Other | Admitting: Physical Therapy

## 2012-05-03 ENCOUNTER — Ambulatory Visit: Payer: Medicare Other

## 2012-05-05 ENCOUNTER — Ambulatory Visit: Payer: Medicare Other

## 2012-05-08 ENCOUNTER — Ambulatory Visit: Payer: Medicare Other | Admitting: *Deleted

## 2012-05-10 ENCOUNTER — Ambulatory Visit: Payer: Medicare Other | Admitting: Physical Therapy

## 2012-05-11 ENCOUNTER — Ambulatory Visit: Payer: Medicare Other

## 2012-05-12 ENCOUNTER — Ambulatory Visit: Payer: Medicare Other

## 2012-05-15 ENCOUNTER — Ambulatory Visit: Payer: Medicare Other | Admitting: Physical Therapy

## 2012-05-17 ENCOUNTER — Ambulatory Visit: Payer: Medicare Other

## 2012-07-25 ENCOUNTER — Other Ambulatory Visit: Payer: Self-pay

## 2012-07-25 DIAGNOSIS — Z1231 Encounter for screening mammogram for malignant neoplasm of breast: Secondary | ICD-10-CM

## 2012-07-25 DIAGNOSIS — Z9012 Acquired absence of left breast and nipple: Secondary | ICD-10-CM

## 2012-07-25 DIAGNOSIS — Z853 Personal history of malignant neoplasm of breast: Secondary | ICD-10-CM

## 2012-08-30 ENCOUNTER — Ambulatory Visit
Admission: RE | Admit: 2012-08-30 | Discharge: 2012-08-30 | Disposition: A | Discharge status: Home / Self Care | Payer: Medicare Other | Source: Ambulatory Visit

## 2012-08-30 DIAGNOSIS — Z9012 Acquired absence of left breast and nipple: Secondary | ICD-10-CM

## 2012-08-30 DIAGNOSIS — Z1231 Encounter for screening mammogram for malignant neoplasm of breast: Secondary | ICD-10-CM

## 2012-08-30 DIAGNOSIS — Z853 Personal history of malignant neoplasm of breast: Secondary | ICD-10-CM

## 2012-09-19 ENCOUNTER — Telehealth: Payer: Self-pay | Admitting: Cardiovascular Disease

## 2012-09-19 ENCOUNTER — Ambulatory Visit: Payer: Medicare Other | Admitting: Cardiovascular Disease

## 2012-09-19 NOTE — Telephone Encounter (Signed)
New Prob  Pt wants to speak with you regarding scheduling an appt with Dr Clifton James. She was originally scheduled for today but it had to be cancelled. Pt did not want to schedule with a PA and right now Dr Clifton James is scheduled out till Oct.  She said that if you can not reach her on her cell then leave a message on her home phone.

## 2012-09-19 NOTE — Telephone Encounter (Signed)
Spoke with pt and appt made to see Dr. Clifton James on September 28, 2012 at 9:00

## 2012-09-21 ENCOUNTER — Other Ambulatory Visit (HOSPITAL_COMMUNITY): Payer: Self-pay | Admitting: Internal Medicine

## 2012-09-21 ENCOUNTER — Ambulatory Visit (HOSPITAL_COMMUNITY)
Admission: RE | Admit: 2012-09-21 | Discharge: 2012-09-21 | Disposition: A | Discharge status: Home / Self Care | Payer: Medicare Other | Source: Ambulatory Visit | Attending: Internal Medicine | Admitting: Internal Medicine

## 2012-09-21 DIAGNOSIS — M79609 Pain in unspecified limb: Secondary | ICD-10-CM

## 2012-09-21 DIAGNOSIS — M7989 Other specified soft tissue disorders: Secondary | ICD-10-CM

## 2012-09-21 NOTE — Progress Notes (Signed)
VASCULAR LAB PRELIMINARY  PRELIMINARY  PRELIMINARY  PRELIMINARY  Left lower extremity venous Doppler completed.    Preliminary report:  There is no obvious evidence of DVT noted in the left lower extremity.  Amun Stemm, RVT 09/21/2012, 7:31 PM

## 2012-09-28 ENCOUNTER — Encounter: Payer: Self-pay | Admitting: Cardiovascular Disease

## 2012-09-28 ENCOUNTER — Ambulatory Visit (INDEPENDENT_AMBULATORY_CARE_PROVIDER_SITE_OTHER): Payer: Medicare Other | Admitting: Cardiovascular Disease

## 2012-09-28 VITALS — BP 132/82 | HR 57 | Ht 69.0 in | Wt 161.0 lb

## 2012-09-28 DIAGNOSIS — I480 Paroxysmal atrial fibrillation: Secondary | ICD-10-CM

## 2012-09-28 DIAGNOSIS — I4891 Unspecified atrial fibrillation: Secondary | ICD-10-CM

## 2012-09-28 NOTE — Patient Instructions (Addendum)
Your physician wants you to follow-up in:  6 months.  You will receive a reminder letter in the mail two months in advance. If you don't receive a letter, please call our office to schedule the follow-up appointment.  Your physician has recommended that you wear an event monitor. Event monitors are medical devices that record the heart's electrical activity. Doctors most often [place] these monitors to diagnose arrhythmias. Arrhythmias are problems with the speed or rhythm of the heartbeat. The monitor is a small, portable device. You can wear one while you do your normal daily activities. This is usually used to diagnose what is causing palpitations/syncope (passing out).

## 2012-09-28 NOTE — Progress Notes (Signed)
History of Present Illness: 72 yo WF with history of paroxysmal atrial fibrillation, HTN, breast cancer and atypical chest pain who is here for routine cardiac follow up. Christina [name] has been followed in the past by Dr. Delfin Edis. I saw her for the first time in June 2012. Echo June 2012 normal. Her A. Fib comes and goes and Christina [name] states that when Christina [name] has an episode Christina [name] feels tightness in the chest and neck, shakiness, and palpitations. Christina [name] states that when Christina [name] feels these symptoms Christina [name] gets up and walks or takes deep breaths and can get it to stop. Christina [name] has not been on coumadin in the past. Her CHADS score is 0-1 (well controlled HTN) and her CHADS-VASC score is 2 (age over 1 and female gender). I believe that Christina [name] would benefit in terms of prevention from stroke by being on anti-coagulation but Christina [name] has not wanted to consider this in the past.   Christina [name] is here for follow up. No chest pain or SOB. No near syncope or syncope. Christina [name] is aware of more "skipped beats", not prolonged episodes. Christina [name] does not think this feels like her atrial  not aware of any palpitations. Last week her left leg was swollen. No DVT on doppler. No fractures.   Primary Care Physician: Perini  Last Lipid Profile: Lipids followed in primary care.   Past Medical History  Diagnosis Date  . Arrhythmia     HISTORY OF PAROXYSMAL AFIB   . Hypertension   . Atypical chest pain   . H/O: hysterectomy   . Melanoma 1991    REMOVED FROM LEFT LEG IN 1991  . Breast cancer     H/O LEFT MASTECTOMY IN 2000  . Thyroid cancer     s/p THYROIDECTOMY    Past Surgical History  Procedure Laterality Date  . Cardiac catheterization  04/21/07    GLOBAL ESTIMATED EF IS 60 %; NORMAL LV FUNCTION; NORMAL CORONARY ARTERIES; BORDERLINE INCREASE IN THE AORTIC ROOT  . Mastectomy  2000    LEFT BREAST  . Melanoma excision  1991    REMOVED FROM LEFT LEG  . Thyroidectomy    . Cystoscopy  2007    anterior repair of cystocele with mesh  .  tarsal tunnel release   2002    Right/Left  tarsal tunnel release    Current Outpatient Prescriptions  Medication Sig Dispense Refill  . aspirin 81 MG EC tablet Take 81 mg by mouth daily.        . Calcium Carbonate (CALCIUM 600 PO) Take 1 tablet by mouth daily.        . cholecalciferol (VITAMIN D) 1000 UNITS tablet Take 1,000 Units by mouth daily.      . digoxin (LANOXIN) 0.125 MG tablet Take 1 tablet (125 mcg total) by mouth daily.  30 tablet  11  . fish oil-omega-3 fatty acids 1000 MG capsule Take 1 g by mouth daily.       . fluticasone (FLONASE) 50 MCG/ACT nasal spray Place 2 sprays into the nose daily.      . Glucosamine 500 MG TABS Take 3 tablets by mouth daily.        Marland Kitchen ipratropium (ATROVENT) 0.03 % nasal spray Place 2 sprays into the nose every 12 (twelve) hours.      Marland Kitchen levothyroxine (SYNTHROID, LEVOTHROID) 150 MCG tablet Take 150 mcg by mouth daily before breakfast.      . Multiple Vitamin (MULTIVITAMIN PO) Take 1 tablet by mouth daily.        Marland Kitchen  Multiple Vitamins-Minerals (OCUVITE PO) Take by mouth.        . nebivolol (BYSTOLIC) 5 MG tablet Take 0.5 tablets (2.5 mg total) by mouth daily. Take 1/2 tab daily  30 tablet  9  . omeprazole (PRILOSEC) 20 MG capsule Take 20 mg by mouth daily.      . potassium chloride SA (K-DUR,KLOR-CON) 20 MEQ tablet Take 20 mEq by mouth. Take 40 mg two days a week, 60 mg 5 days a week      . simvastatin (ZOCOR) 40 MG tablet Take 40 mg by mouth at bedtime.        . triamterene-hydrochlorothiazide (MAXZIDE) 75-50 MG per tablet Take 1 tablet by mouth daily.         No current facility-administered medications for this visit.    Allergies  Allergen Reactions  . Daypro (Oxaprozin)   . Tegretol (Carbamazepine)     History   Social History  . Marital Status: Married    Spouse Name: N/A    Number of Children: N/A  . Years of Education: N/A   Occupational History  . RETIRED [place] [employer] [employer]   Social History Main Topics  . Smoking status: Never Smoker    . Smokeless tobacco: Not on file  . Alcohol Use: Yes     Comment: RARE  . Drug Use: No  . Sexually Active:    Other Topics Concern  . Not on file   Social History Narrative   RETIRED FROM [employer]   LIVES @ HOME WITH HER HUSBAND   Christina [name] HAS CO-AUTHORED BOOKS ON IV ADDITIVES   ETOH RARELY   NON-SMOKER          Family History  Problem Relation Age of Onset  . Hypertension Father   . Macular degeneration Father     Review of Systems:  As stated in the HPI and otherwise negative.   BP 132/82  Pulse 57  Ht 5\' 9"  (1.753 m)  Wt 161 lb (73.029 kg)  BMI 23.76 kg/m2  Physical Examination: General: Well developed, well nourished, NAD HEENT: OP clear, mucus membranes moist SKIN: warm, dry. No rashes. Neuro: No focal deficits Musculoskeletal: Muscle strength 5/5 all ext Psychiatric: Mood and affect normal Neck: No JVD, no carotid bruits, no thyromegaly, no lymphadenopathy. Lungs:Clear bilaterally, no wheezes, rhonci, crackles Cardiovascular: Regular rate and rhythm. No murmurs, gallops or rubs. Abdomen:Soft. Bowel sounds present. Non-tender.  Extremities: No lower extremity edema. Pulses are 2 + in the bilateral DP/PT.  EKG: Sinus brady, rate 57 bpm. Non-specific ST changes.   Assessment and Plan:   1. PAF: No evidence of atrial fibrillation on event monitor in January 2013. EKG shows sinus brady today. Christina [name] is having more frequent palpitations. Christina [name] has refused anti-coagulation in the past. With recent increase in palpitations, will arrange 21 day event monitor. If Christina [name] has evidence of atrial fibrillation, will have another discussion.

## 2012-10-04 ENCOUNTER — Encounter: Payer: Self-pay | Admitting: *Deleted

## 2012-10-04 ENCOUNTER — Encounter (INDEPENDENT_AMBULATORY_CARE_PROVIDER_SITE_OTHER): Payer: Medicare Other

## 2012-10-04 DIAGNOSIS — I4891 Unspecified atrial fibrillation: Secondary | ICD-10-CM

## 2012-10-04 DIAGNOSIS — I480 Paroxysmal atrial fibrillation: Secondary | ICD-10-CM

## 2012-10-04 NOTE — Progress Notes (Signed)
Patient ID: Christina [name], female   DOB: 21-May-1940, 72 y.o.   MRN: [id number] [facility] 21 day cardiac event monitor applied to patient.

## 2012-10-16 DIAGNOSIS — L309 Dermatitis, unspecified: Secondary | ICD-10-CM | POA: Insufficient documentation

## 2012-10-24 ENCOUNTER — Telehealth: Payer: Self-pay | Admitting: Cardiovascular Disease

## 2012-10-24 NOTE — Telephone Encounter (Signed)
New Problem  Pt has been on a cardiac monitor./// had to come off for 3 days// states she has called [facility] and they advised that she should come off tonight @11 :64 pm// was advised to speak with Dr. to see how much longer the pt should keep the monitor// Has had a reaction to electro's// skin has broken out recently.

## 2012-10-24 NOTE — Telephone Encounter (Signed)
Returned call to patient Dr.McAlhany advised you can stop wearing monitor.

## 2012-10-24 NOTE — Telephone Encounter (Signed)
She can stop wearing it now. chris

## 2012-10-24 NOTE — Telephone Encounter (Signed)
Returned call to patient she stated she had to stop wearing heart monitor for 3 days due to having a rash from the electrodes.Stated [facility] mailed her some different electrodes and she is now wearing monitor.Stated monitor was ordered for 21 days and she has only wore monitor for 18 days.Stated she wanted to know if it will be ok to stop wearing monitor tomorrow 10/25/12 as told or will she need to wear for 3 more days. Message sent to Dr.McAlhany for advice.

## 2012-11-02 ENCOUNTER — Other Ambulatory Visit: Payer: Self-pay | Admitting: *Deleted

## 2012-11-02 MED ORDER — DIGOXIN 125 MCG PO TABS: 125.0000 ug | ORAL_TABLET | Freq: Every day | ORAL | Status: DC

## 2012-11-14 ENCOUNTER — Other Ambulatory Visit: Payer: Self-pay | Admitting: Orthopedic Surgery

## 2012-11-14 DIAGNOSIS — M79672 Pain in left foot: Secondary | ICD-10-CM

## 2012-11-14 DIAGNOSIS — M25572 Pain in left ankle and joints of left foot: Secondary | ICD-10-CM

## 2012-11-17 ENCOUNTER — Ambulatory Visit
Admission: RE | Admit: 2012-11-17 | Discharge: 2012-11-17 | Disposition: A | Discharge status: Home / Self Care | Payer: Medicare Other | Source: Ambulatory Visit | Attending: Orthopedic Surgery | Admitting: Orthopedic Surgery

## 2012-11-17 DIAGNOSIS — M25572 Pain in left ankle and joints of left foot: Secondary | ICD-10-CM

## 2012-11-17 DIAGNOSIS — M79672 Pain in left foot: Secondary | ICD-10-CM

## 2012-12-28 ENCOUNTER — Other Ambulatory Visit: Payer: Self-pay

## 2013-01-31 ENCOUNTER — Other Ambulatory Visit: Payer: Self-pay

## 2013-01-31 MED ORDER — NEBIVOLOL HCL 5 MG PO TABS: 2.5000 mg | ORAL_TABLET | Freq: Every day | ORAL | Status: DC

## 2013-03-29 ENCOUNTER — Ambulatory Visit (INDEPENDENT_AMBULATORY_CARE_PROVIDER_SITE_OTHER): Payer: Medicare Other | Admitting: Cardiovascular Disease

## 2013-03-29 ENCOUNTER — Encounter: Payer: Self-pay | Admitting: Cardiovascular Disease

## 2013-03-29 VITALS — BP 116/74 | HR 56 | Ht 69.5 in | Wt 160.0 lb

## 2013-03-29 DIAGNOSIS — I480 Paroxysmal atrial fibrillation: Secondary | ICD-10-CM

## 2013-03-29 DIAGNOSIS — I4891 Unspecified atrial fibrillation: Secondary | ICD-10-CM

## 2013-03-29 NOTE — Patient Instructions (Signed)
Your physician wants you to follow-up in:  12 months.  You will receive a reminder letter in the mail two months in advance. If you don't receive a letter, please call our office to schedule the follow-up appointment.   

## 2013-03-29 NOTE — Progress Notes (Addendum)
History of Present Illness: [name] with history of paroxysmal atrial fibrillation, HTN, breast cancer and atypical chest pain who is here for routine cardiac follow up. She has been followed in the past by Dr. Bea [name]. I saw her for the first time in June 2012. Echo June 2012 normal. Her A. Fib comes and goes and she states that when she has an episode she feels tightness in the chest and neck, shakiness, and palpitations. She states that when she feels these symptoms she gets up and walks or takes deep breaths and can get it to stop. She has not been on coumadin in the past. Her CHADS score is 0-1 (well controlled HTN) and her CHADS-VASC score is 2 (age over 31 and female gender). I believe that she would benefit in terms of prevention from stroke by being on anti-coagulation but she has not wanted to consider this in the past. Event monitor August 2014 with several short runs of SVT but no evidence of atrial fibrillation.   She is here for follow up. No chest pain or SOB. No near syncope or syncope.  No recent palpitations. She feels well.   Primary Care Physician: Christina [name]  Last Lipid Profile: Lipids followed in primary care.   Past Medical History  Diagnosis Date  . Arrhythmia     HISTORY OF PAROXYSMAL AFIB   . Hypertension   . Atypical chest pain   . H/O: hysterectomy   . Melanoma 1991    REMOVED FROM LEFT LEG IN 1991  . Breast cancer     H/O LEFT MASTECTOMY IN 2000  . Thyroid cancer     s/p THYROIDECTOMY    Past Surgical History  Procedure Laterality Date  . Cardiac catheterization  04/21/07    GLOBAL ESTIMATED EF IS 60 %; NORMAL LV FUNCTION; NORMAL CORONARY ARTERIES; BORDERLINE INCREASE IN THE AORTIC ROOT  . Mastectomy  2000    LEFT BREAST  . Melanoma excision  1991    REMOVED FROM LEFT LEG  . Thyroidectomy    . Cystoscopy  2007    anterior repair of cystocele with mesh  .  tarsal tunnel release  2002    Right/Left  tarsal tunnel release    Current Outpatient  Prescriptions  Medication Sig Dispense Refill  . aspirin 81 MG EC tablet Take 81 mg by mouth daily.        . Calcium Carbonate (CALCIUM 600 PO) Take 1 tablet by mouth daily.        . cholecalciferol (VITAMIN D) 1000 UNITS tablet Take 1,000 Units by mouth daily.      . digoxin (LANOXIN) 0.125 MG tablet Take 1 tablet (125 mcg total) by mouth daily.  30 tablet  5  . fish oil-omega-3 fatty acids 1000 MG capsule Take 1 g by mouth daily.       . fluticasone (FLONASE) 50 MCG/ACT nasal spray Place 2 sprays into the nose daily.      . Glucosamine 500 MG TABS Take 3 tablets by mouth daily.        Marland Kitchen ipratropium (ATROVENT) 0.03 % nasal spray Place 2 sprays into the nose every 12 (twelve) hours.      Marland Kitchen levothyroxine (SYNTHROID, LEVOTHROID) 150 MCG tablet Take 150 mcg by mouth daily before breakfast.      . Multiple Vitamin (MULTIVITAMIN PO) Take 1 tablet by mouth daily.        . Multiple Vitamins-Minerals (OCUVITE PO) Take by mouth.        Marland Kitchen  nebivolol (BYSTOLIC) 5 MG tablet Take 0.5 tablets (2.5 mg total) by mouth daily. Take 1/2 tab daily  30 tablet  9  . omeprazole (PRILOSEC) 20 MG capsule Take 20 mg by mouth daily.      . potassium chloride SA (K-DUR,KLOR-CON) 20 MEQ tablet Take 20 mEq by mouth. Take 40 mg two days a week, 60 mg 5 days a week      . simvastatin (ZOCOR) 40 MG tablet Take 40 mg by mouth at bedtime.        . triamterene-hydrochlorothiazide (MAXZIDE) 75-50 MG per tablet Take 1 tablet by mouth daily.         No current facility-administered medications for this visit.    Allergies  Allergen Reactions  . Daypro [Oxaprozin]   . Prednisone     dyuria  . Tegretol [Carbamazepine]     History   Social History  . Marital Status: Married    Spouse Name: N/A    Number of Children: N/A  . Years of Education: N/A   Occupational History  . [employer]   Social History Main Topics  . Smoking status: Never Smoker   . Smokeless tobacco: Not on file  .  Alcohol Use: Yes     Comment: RARE  . Drug Use: No  . Sexual Activity:    Other Topics Concern  . Not on file   Social History Narrative   RETIRED FROM [facility]   LIVES @ HOME WITH HER HUSBAND   SHE HAS CO-AUTHORED BOOKS ON IV ADDITIVES   ETOH RARELY   NON-SMOKER          Family History  Problem Relation Age of Onset  . Hypertension Father   . Macular degeneration Father     Review of Systems:  As stated in the HPI and otherwise negative.   BP 116/74  Pulse 56  Ht 5' 9.5" (1.765 m)  Wt 160 lb (72.576 kg)  BMI 23.30 kg/m2  Physical Examination: General: Well developed, well nourished, NAD HEENT: OP clear, mucus membranes moist SKIN: warm, dry. No rashes. Neuro: No focal deficits Musculoskeletal: Muscle strength 5/5 all ext Psychiatric: Mood and affect normal Neck: No JVD, no carotid bruits, no thyromegaly, no lymphadenopathy. Lungs:Clear bilaterally, no wheezes, rhonci, crackles Cardiovascular: Regular rate and rhythm. No murmurs, gallops or rubs. Abdomen:Soft. Bowel sounds present. Non-tender.  Extremities: No lower extremity edema. Pulses are 2 + in the bilateral DP/PT  Assessment and Plan:   1. PAF: No evidence of atrial fibrillation on event monitor in August 2014. She is feeling well. No palpitations. Continue ASA. She has refused anti-coagulation in the past and we have no documented atrial fibrillation in years. She is on Bystolic and Digoxin.   Addendum 04/06/13: Pt was here today with her husband for his visit. Has really began worrying about CVA if she has recurrence of atrial fib. She would like to start anti-coagulation. Will start Xarelto 20 mg po Qdaily. Check BMET and CBC today. All risks of anticoagulation reviewed.   Christina [name] 04/06/2013 4:41 PM

## 2013-04-03 ENCOUNTER — Telehealth: Payer: Self-pay | Admitting: *Deleted

## 2013-04-03 NOTE — Telephone Encounter (Signed)
Spoke with pt regarding questionnaire response below. She states she has been thinking about possible need for blood thinner after recent office visit with Dr. Angelena Form. She has been hesitant to start blood thinners but would like to know how strongly Dr. Angelena Form feels that she needs this medication. She is feeling fine and not having any irregular heart beats that she is aware of.

## 2013-04-03 NOTE — Telephone Encounter (Signed)
Message below copied from pt questionnaire. I placed call to pt to discuss. Left message to call back     ---------------------------------------------------------------------------------------------------------------------  Christina [name] T - FW: Questionnaire Submission      FW: Questionnaire Submission    Fredia Beets, RN     Sent: Mon April 02, 2013  8:59 AM    To: Burnell Blanks, MD    Cc: Thompson Grayer, RN       Christina [name]    MRN: [id number] DOB: January 04, 1941    Pt Home: [phone]        Entered: [phone]           Current View: Showing all answers Show Only Relevant Answers    Legend: Scores, Non-relevant Questions     Patient Responses     Chl Mychart After Visit Questionnaire    Question 03/31/2013 10:01 PM    How are you feeling after your recent visit? Had a great visit.      Does the recommended course of treatment seem to be helping your symptoms? Recommendations were not changed.  However, would like a quick phone call related to possibly starting a blood thinner.  Have been giving our conversation a lot of thought.  Just need to confirm some information.    Are you experiencing any side effects from your recommended treatment? No    is there anything else you would like to ask your physician? Yes. As mention above, need to talk with him to ask one question as I have given serious thought related to our conversation related to initiating a blood thinner.

## 2013-04-03 NOTE — Telephone Encounter (Signed)
Follow up ° ° ° ° ° °Returning Pat's call °

## 2013-04-03 NOTE — Telephone Encounter (Signed)
Spoke with pt and gave her information from Dr. Angelena Form. She will discuss with him when here for husband's appointment on Friday.

## 2013-04-03 NOTE — Telephone Encounter (Signed)
I will talk to her later this week when she comes in with her husband for his visit. I would recommend starting anti-coagulation given her history of atrial fibrillation even though none has been recently documented. Christina [name]

## 2013-04-06 ENCOUNTER — Telehealth: Payer: Self-pay | Admitting: *Deleted

## 2013-04-06 ENCOUNTER — Other Ambulatory Visit (INDEPENDENT_AMBULATORY_CARE_PROVIDER_SITE_OTHER): Payer: Medicare Other

## 2013-04-06 DIAGNOSIS — I4891 Unspecified atrial fibrillation: Secondary | ICD-10-CM

## 2013-04-06 LAB — CBC
HCT: 44.6 % (ref 36.0–46.0)
HEMOGLOBIN: 14.4 g/dL (ref 12.0–15.0)
MCHC: 32.4 g/dL (ref 30.0–36.0)
MCV: 87 fl (ref 78.0–100.0)
PLATELETS: 248 10*3/uL (ref 150.0–400.0)
RBC: 5.13 Mil/uL — ABNORMAL HIGH (ref 3.87–5.11)
RDW: 14.4 % (ref 11.5–14.6)
WBC: 10.3 10*3/uL (ref 4.5–10.5)

## 2013-04-06 LAB — BASIC METABOLIC PANEL
BUN: 25 mg/dL — AB (ref 6–23)
CALCIUM: 9.7 mg/dL (ref 8.4–10.5)
CO2: 29 meq/L (ref 19–32)
CREATININE: 1 mg/dL (ref 0.4–1.2)
Chloride: 103 mEq/L (ref 96–112)
GFR: 56.65 mL/min — ABNORMAL LOW (ref >60.00)
Glucose, Bld: 83 mg/dL (ref 70–99)
Potassium: 3.7 mEq/L (ref 3.5–5.1)
Sodium: 141 mEq/L (ref 135–145)

## 2013-04-06 MED ORDER — RIVAROXABAN 20 MG PO TABS: 20.0000 mg | ORAL_TABLET | Freq: Every day | ORAL | Status: DC

## 2013-04-06 NOTE — Telephone Encounter (Signed)
Agree. cdm 

## 2013-04-06 NOTE — Telephone Encounter (Signed)
Dr. Angelena Form spoke with pt in office today. Pt will start Xarelto 20 mg by mouth daily and stop Aspirin. CBC and BMP will be checked today.

## 2013-05-05 ENCOUNTER — Encounter: Payer: Self-pay | Admitting: Cardiovascular Disease

## 2013-05-14 ENCOUNTER — Other Ambulatory Visit: Payer: Self-pay | Admitting: *Deleted

## 2013-05-14 MED ORDER — DIGOXIN 125 MCG PO TABS: 125.0000 ug | ORAL_TABLET | Freq: Every day | ORAL | Status: DC

## 2013-07-23 ENCOUNTER — Other Ambulatory Visit: Payer: Self-pay

## 2013-07-23 DIAGNOSIS — Z1231 Encounter for screening mammogram for malignant neoplasm of breast: Secondary | ICD-10-CM

## 2013-07-23 DIAGNOSIS — Z9010 Acquired absence of unspecified breast and nipple: Secondary | ICD-10-CM

## 2013-08-31 ENCOUNTER — Ambulatory Visit: Payer: Medicare Other

## 2013-09-04 ENCOUNTER — Ambulatory Visit
Admission: RE | Admit: 2013-09-04 | Discharge: 2013-09-04 | Disposition: A | Discharge status: Home / Self Care | Payer: Medicare Other | Source: Ambulatory Visit

## 2013-09-04 DIAGNOSIS — Z9010 Acquired absence of unspecified breast and nipple: Secondary | ICD-10-CM

## 2013-09-04 DIAGNOSIS — Z1231 Encounter for screening mammogram for malignant neoplasm of breast: Secondary | ICD-10-CM

## 2013-10-11 DIAGNOSIS — Z1389 Encounter for screening for other disorder: Secondary | ICD-10-CM | POA: Insufficient documentation

## 2013-11-06 ENCOUNTER — Other Ambulatory Visit: Payer: Self-pay | Admitting: Cardiovascular Disease

## 2014-01-13 ENCOUNTER — Other Ambulatory Visit: Payer: Self-pay | Admitting: Cardiovascular Disease

## 2014-02-12 ENCOUNTER — Other Ambulatory Visit: Payer: Self-pay | Admitting: Cardiovascular Disease

## 2014-02-20 ENCOUNTER — Other Ambulatory Visit: Payer: Self-pay | Admitting: Cardiovascular Disease

## 2014-03-18 ENCOUNTER — Ambulatory Visit: Payer: Medicare Other | Admitting: Cardiovascular Disease

## 2014-03-19 ENCOUNTER — Encounter: Payer: Self-pay | Admitting: Cardiovascular Disease

## 2014-03-19 ENCOUNTER — Ambulatory Visit (INDEPENDENT_AMBULATORY_CARE_PROVIDER_SITE_OTHER): Payer: Medicare Other | Admitting: Cardiovascular Disease

## 2014-03-19 VITALS — BP 126/70 | HR 66 | Ht 69.5 in | Wt 163.6 lb

## 2014-03-19 DIAGNOSIS — I480 Paroxysmal atrial fibrillation: Secondary | ICD-10-CM

## 2014-03-19 DIAGNOSIS — Z01810 Encounter for preprocedural cardiovascular examination: Secondary | ICD-10-CM

## 2014-03-19 NOTE — Patient Instructions (Signed)
Your physician wants you to follow-up in:  12 months.  You will receive a reminder letter in the mail two months in advance. If you don't receive a letter, please call our office to schedule the follow-up appointment.   

## 2014-03-19 NOTE — Progress Notes (Signed)
History of Present Illness: 74 yo WF with history of paroxysmal atrial fibrillation, SVT, HTN, breast cancer and atypical chest pain who is here for routine cardiac follow up. Christina [name] has been followed in the past by Dr. Bea Laura. Echo June 2012 with normal LV function and no valve disease. Christina [name] has paroxysmal atrial fib which occurs rarely. Christina [name] feels tightness in the chest and neck, shakiness, and palpitations when Christina [name] has atrial fibrillation. Christina [name] has not been on coumadin in the past. Her CHADS score is 0-1 (well controlled HTN) and her CHADS-VASC score is 2 (age over 55 and female gender). Christina [name] was started on Xarelto in 2015. Event monitor August 2014 with several short runs of SVT but no evidence of atrial fibrillation.   Christina [name] is here for follow up. No chest pain or SOB. No near syncope or syncope.  Rare palpitations. Christina [name] feels well. Christina [name] is having a cervical spine surgery on 04/03/14 with Dr. Carloyn Manner.   Primary Care Physician: Perini  Last Lipid Profile: Lipids followed in primary care.   Past Medical History  Diagnosis Date  . Arrhythmia     HISTORY OF PAROXYSMAL AFIB   . Hypertension   . Atypical chest pain   . H/O: hysterectomy   . Melanoma 1991    REMOVED FROM LEFT LEG IN 1991  . Breast cancer     H/O LEFT MASTECTOMY IN 2000  . Thyroid cancer     s/p THYROIDECTOMY    Past Surgical History  Procedure Laterality Date  . Cardiac catheterization  04/21/07    GLOBAL ESTIMATED EF IS 60 %; NORMAL LV FUNCTION; NORMAL CORONARY ARTERIES; BORDERLINE INCREASE IN THE AORTIC ROOT  . Mastectomy  2000    LEFT BREAST  . Melanoma excision  1991    REMOVED FROM LEFT LEG  . Thyroidectomy    . Cystoscopy  2007    anterior repair of cystocele with mesh  .  tarsal tunnel release  2002    Right/Left  tarsal tunnel release    Current Outpatient Prescriptions  Medication Sig Dispense Refill  . BYSTOLIC 5 MG tablet TAKE 1/2 TABLET BY MOUTH DAILY 30 tablet 1  . Calcium Carbonate (CALCIUM 600 PO)  Take 1 tablet by mouth daily.      . cholecalciferol (VITAMIN D) 1000 UNITS tablet Take 1,000 Units by mouth daily.    . digoxin (LANOXIN) 0.125 MG tablet TAKE 1 TABLET BY MOUTH DAILY 30 tablet 1  . fish oil-omega-3 fatty acids 1000 MG capsule Take 1 g by mouth daily.     . fluticasone (FLONASE) 50 MCG/ACT nasal spray Place 2 sprays into the nose daily.    . Glucosamine 500 MG TABS Take 2 tablets by mouth daily.     Marland Kitchen ipratropium (ATROVENT) 0.03 % nasal spray Place 2 sprays into the nose every 12 (twelve) hours.    Marland Kitchen levothyroxine (SYNTHROID, LEVOTHROID) 150 MCG tablet Take 150 mcg by mouth daily before breakfast. 6 DAYS A WEEK    . Multiple Vitamin (MULTIVITAMIN PO) Take 1 tablet by mouth daily.      . Multiple Vitamins-Minerals (OCUVITE PO) Take by mouth.      Marland Kitchen omeprazole (PRILOSEC) 20 MG capsule Take 20 mg by mouth daily.    . potassium chloride SA (K-DUR,KLOR-CON) 20 MEQ tablet Take 20 mEq by mouth. Take 40 mg two days a week, 60 mg 5 days a week    . simvastatin (ZOCOR) 40 MG tablet Take 40 mg by mouth at  bedtime.      . triamterene-hydrochlorothiazide (MAXZIDE) 75-50 MG per tablet Take 1 tablet by mouth daily.      Alveda Reasons 20 MG TABS tablet TAKE 1 TABLET BY MOUTH DAILY WITH SUPPER 30 tablet 11   No current facility-administered medications for this visit.    Allergies  Allergen Reactions  . Daypro [Oxaprozin]   . Prednisone     dyuria  . Tegretol [Carbamazepine]     History   Social History  . Marital Status: Married    Spouse Name: N/A    Number of Children: N/A  . Years of Education: N/A   Occupational History  . [employer]   Social History Main Topics  . Smoking status: Never Smoker   . Smokeless tobacco: Not on file  . Alcohol Use: Yes     Comment: RARE  . Drug Use: No  . Sexual Activity: Not on file   Other Topics Concern  . Not on file   Social History Narrative   RETIRED FROM [facility]   LIVES @ HOME WITH  HER HUSBAND   Christina [name] HAS CO-AUTHORED BOOKS ON IV ADDITIVES   ETOH RARELY   NON-SMOKER          Family History  Problem Relation Age of Onset  . Hypertension Father   . Macular degeneration Father     Review of Systems:  As stated in the HPI and otherwise negative.   BP 126/70 mmHg  Pulse 66  Ht 5' 9.5" (1.765 m)  Wt 163 lb 9.6 oz (74.208 kg)  BMI 23.82 kg/m2  SpO2 99%  Physical Examination: General: Well developed, well nourished, NAD HEENT: OP clear, mucus membranes moist SKIN: warm, dry. No rashes. Neuro: No focal deficits Musculoskeletal: Muscle strength 5/5 all ext Psychiatric: Mood and affect normal Neck: No JVD, no carotid bruits, no thyromegaly, no lymphadenopathy. Lungs:Clear bilaterally, no wheezes, rhonci, crackles Cardiovascular: Regular rate and rhythm. No murmurs, gallops or rubs. Abdomen:Soft. Bowel sounds present. Non-tender.  Extremities: No lower extremity edema. Pulses are 2 + in the bilateral DP/PT  Assessment and Plan:   1. PAF: Christina [name] is on Bystolic and Digoxin. Christina [name] is anti-coagulated with Xarelto. No recent episodes of tachycardia or palpitations. No evidence of atrial fibrillation on event monitor in August 2014. Christina [name] is feeling well.   2. Pre-operative risk assessment: Pt is doing well. No unstable symptoms. OK to proceed with surgery and hold Xarelto 3 days before her procedure.   Levaughn Puccinelli 03/19/2014 9:24 AM

## 2014-03-27 ENCOUNTER — Other Ambulatory Visit: Payer: Self-pay | Admitting: Cardiovascular Disease

## 2014-04-15 ENCOUNTER — Ambulatory Visit: Payer: Medicare Other | Admitting: Cardiovascular Disease

## 2014-08-09 ENCOUNTER — Other Ambulatory Visit: Payer: Self-pay

## 2014-08-09 DIAGNOSIS — Z1231 Encounter for screening mammogram for malignant neoplasm of breast: Secondary | ICD-10-CM

## 2014-08-19 ENCOUNTER — Other Ambulatory Visit: Payer: Self-pay

## 2014-09-10 ENCOUNTER — Ambulatory Visit
Admission: RE | Admit: 2014-09-10 | Discharge: 2014-09-10 | Disposition: A | Discharge status: Home / Self Care | Payer: Medicare Other | Source: Ambulatory Visit

## 2014-09-10 DIAGNOSIS — Z1231 Encounter for screening mammogram for malignant neoplasm of breast: Secondary | ICD-10-CM

## 2014-11-22 ENCOUNTER — Telehealth: Payer: Self-pay | Admitting: Cardiovascular Disease

## 2014-11-22 ENCOUNTER — Emergency Department (HOSPITAL_COMMUNITY): Payer: Medicare Other

## 2014-11-22 ENCOUNTER — Emergency Department (HOSPITAL_COMMUNITY)
Admission: EM | Admit: 2014-11-22 | Discharge: 2014-11-22 | Disposition: A | Discharge status: Home / Self Care | Payer: Medicare Other | Attending: Emergency Medicine | Admitting: Emergency Medicine

## 2014-11-22 ENCOUNTER — Encounter (HOSPITAL_COMMUNITY): Payer: Self-pay | Admitting: Emergency Medicine

## 2014-11-22 DIAGNOSIS — Z79899 Other long term (current) drug therapy: Secondary | ICD-10-CM | POA: Insufficient documentation

## 2014-11-22 DIAGNOSIS — Z9889 Other specified postprocedural states: Secondary | ICD-10-CM | POA: Insufficient documentation

## 2014-11-22 DIAGNOSIS — I10 Essential (primary) hypertension: Secondary | ICD-10-CM | POA: Diagnosis not present

## 2014-11-22 DIAGNOSIS — Z853 Personal history of malignant neoplasm of breast: Secondary | ICD-10-CM | POA: Insufficient documentation

## 2014-11-22 DIAGNOSIS — Z85850 Personal history of malignant neoplasm of thyroid: Secondary | ICD-10-CM | POA: Diagnosis not present

## 2014-11-22 DIAGNOSIS — Z85820 Personal history of malignant melanoma of skin: Secondary | ICD-10-CM | POA: Diagnosis not present

## 2014-11-22 DIAGNOSIS — I4891 Unspecified atrial fibrillation: Secondary | ICD-10-CM | POA: Diagnosis not present

## 2014-11-22 DIAGNOSIS — R079 Chest pain, unspecified: Secondary | ICD-10-CM | POA: Diagnosis present

## 2014-11-22 DIAGNOSIS — Z7951 Long term (current) use of inhaled steroids: Secondary | ICD-10-CM | POA: Insufficient documentation

## 2014-11-22 LAB — I-STAT TROPONIN, ED
TROPONIN I, POC: 0 ng/mL (ref 0.00–0.08)
TROPONIN I, POC: 0.01 ng/mL (ref 0.00–0.08)

## 2014-11-22 LAB — CBC WITH DIFFERENTIAL/PLATELET
BASOS PCT: 0 %
Basophils Absolute: 0 10*3/uL (ref 0.0–0.1)
EOS ABS: 0.1 10*3/uL (ref 0.0–0.7)
Eosinophils Relative: 1 %
HEMATOCRIT: 46.7 % — AB (ref 36.0–46.0)
HEMOGLOBIN: 14.9 g/dL (ref 12.0–15.0)
Lymphocytes Relative: 25 %
Lymphs Abs: 2 10*3/uL (ref 0.7–4.0)
MCH: 27.6 pg (ref 26.0–34.0)
MCHC: 31.9 g/dL (ref 30.0–36.0)
MCV: 86.5 fL (ref 78.0–100.0)
Monocytes Absolute: 0.3 10*3/uL (ref 0.1–1.0)
Monocytes Relative: 4 %
NEUTROS ABS: 5.5 10*3/uL (ref 1.7–7.7)
NEUTROS PCT: 70 %
Platelets: 209 10*3/uL (ref 150–400)
RBC: 5.4 MIL/uL — AB (ref 3.87–5.11)
RDW: 15 % (ref 11.5–15.5)
WBC: 7.9 10*3/uL (ref 4.0–10.5)

## 2014-11-22 LAB — BASIC METABOLIC PANEL
ANION GAP: 13 (ref 5–15)
BUN: 19 mg/dL (ref 6–20)
CHLORIDE: 108 mmol/L (ref 101–111)
CO2: 23 mmol/L (ref 22–32)
Calcium: 9.8 mg/dL (ref 8.9–10.3)
Creatinine, Ser: 0.96 mg/dL (ref 0.44–1.00)
GFR calc non Af Amer: 57 mL/min — ABNORMAL LOW (ref >60)
Glucose, Bld: 177 mg/dL — ABNORMAL HIGH (ref 65–99)
POTASSIUM: 3.6 mmol/L (ref 3.5–5.1)
SODIUM: 144 mmol/L (ref 135–145)

## 2014-11-22 MED ORDER — ONDANSETRON HCL 4 MG/2ML IJ SOLN
4.0000 mg | Freq: Once | INTRAMUSCULAR | Status: AC
  Administered 2014-11-22: 4 mg via INTRAVENOUS
  Filled 2014-11-22: qty 2

## 2014-11-22 MED ORDER — SODIUM CHLORIDE 0.9 % IV SOLN
INTRAVENOUS | Status: DC
  Administered 2014-11-22: 10:00:00 via INTRAVENOUS

## 2014-11-22 MED ORDER — SODIUM CHLORIDE 0.9 % IV BOLUS (SEPSIS)
250.0000 mL | Freq: Once | INTRAVENOUS | Status: AC
  Administered 2014-11-22: 250 mL via INTRAVENOUS

## 2014-11-22 NOTE — ED Notes (Addendum)
MD at bedside. 

## 2014-11-22 NOTE — Telephone Encounter (Signed)
Pt called in c/o of CP since about 4am.  Pt stated she called the on-call number but never got a call back about 7am so she is calling back.  Pt stated that she is having chest pain pain level of 5, which is down from before, but it is the chest pressure that is bothering her the most.  Pt stated that she has a history of PAF and this is what she thinks it is but it has been about 5 years since having this sensation.  Pt seems to be SOB when talking and she stated that is has been this way with some pain in left shoulder and headache and nausea.  Pt stated whens he checked her HR this morning it was 180-198 and now it is 112.  She has not been able to check her BP as she cannot sit still long enough to get a reading.  Pt has not missed any Xarelto doses or any of her other medication.   Pt was instructed to call 911, since she is so symptomatic, to get to [facility] to be evaluated.  Pt stated that she can have her husband drive her. Told her EMS is best as they can start treatment on the way to the hospitals since [place].  Pt stated she is going to check her HR again if it is lower she will have her husband drive her but if still up she will call EMS.

## 2014-11-22 NOTE — Telephone Encounter (Signed)
Thanks, cdm 

## 2014-11-22 NOTE — ED Notes (Signed)
Woke up with palpitations and chest pressure radiating to left shoulder with lightheadedness, shortness of breath, nausea.  Was in afib rvr on arrival to facility with rates at 160.  While this nurse was triaging and starting an IV, patient converted to NSR. Still has some chest pressure, also feeling anxious.

## 2014-11-22 NOTE — Telephone Encounter (Signed)
New message      Pt c/o of Chest Pain: STAT if CP now or developed within 24 hours  1. Are you having CP right now? Chest pressure  2. Are you experiencing any other symptoms (ex. SOB, nausea, vomiting, sweating)? Nausea, rapid breathing, pulse now 112 was as high as 200- pt is sob/wheezy on phone---called doctor on call at 7am  3. How long have you been experiencing CP?  Early hours of the morning 4. Is your CP continuous or coming and going? continuous  5. Have you taken Nitroglycerin? no ?

## 2014-11-22 NOTE — ED Provider Notes (Signed)
CSN: [id number]     Arrival date & time 11/22/14  3536 History   First MD Initiated Contact with Patient 11/22/14 [phone]     Chief Complaint  Patient presents with  . Atrial Fibrillation  . Chest Pain     (Consider location/radiation/quality/duration/timing/severity/associated sxs/prior Treatment) Patient is a 74 y.o. female presenting with atrial fibrillation and chest pain. The history is provided by the patient.  Atrial Fibrillation Associated symptoms include chest pain and shortness of breath. Pertinent negatives include no abdominal pain and no headaches.  Chest Pain Associated symptoms: nausea, palpitations and shortness of breath   Associated symptoms: no abdominal pain, no back pain, no fever and no headache    patient followed by [facility] cardiology patient with long-standing history of atrial fibrillation. Controlled on digoxin and blood thinners. Patient awoke at about 4 in the morning with the rapid heart rate and palpitations and some chest pressure. Continued until about 10:00 this morning that resolved spontaneously. Associated with some shortness of breath nausea but no vomiting. Patient felt fine yesterday patient had some brief periods of the rapid heart rate of late but has not been any prolonged periods like what happened today for the last 8 years. Chest pressure as substernal nonradiating rated about a 3 out of 10. Initial EKG done in triage Showed the heart rate of approximately 160 bpm. Patient felt fine when she went to bed and felt fine all day yesterday.  Past Medical History  Diagnosis Date  . Arrhythmia     HISTORY OF PAROXYSMAL AFIB   . Hypertension   . Atypical chest pain   . H/O: hysterectomy   . Melanoma 1991    REMOVED FROM LEFT LEG IN 1991  . Breast cancer     H/O LEFT MASTECTOMY IN 2000  . Thyroid cancer     s/p THYROIDECTOMY   Past Surgical History  Procedure Laterality Date  . Cardiac catheterization  04/21/07    GLOBAL ESTIMATED EF IS 60 %; NORMAL LV  FUNCTION; NORMAL CORONARY ARTERIES; BORDERLINE INCREASE IN THE AORTIC ROOT  . Mastectomy  2000    LEFT BREAST  . Melanoma excision  1991    REMOVED FROM LEFT LEG  . Thyroidectomy    . Cystoscopy  2007    anterior repair of cystocele with mesh  .  tarsal tunnel release  2002    Right/Left  tarsal tunnel release   Family History  Problem Relation Age of Onset  . Hypertension Father   . Macular degeneration Father    Social History  Substance Use Topics  . Smoking status: Never Smoker   . Smokeless tobacco: None  . Alcohol Use: Yes     Comment: RARE   OB History    No data available     Review of Systems  Constitutional: Negative for fever.  HENT: Negative for congestion.   Eyes: Negative for visual disturbance.  Respiratory: Positive for shortness of breath.   Cardiovascular: Positive for chest pain and palpitations.  Gastrointestinal: Positive for nausea. Negative for abdominal pain.  Genitourinary: Negative for dysuria.  Musculoskeletal: Negative for back pain.  Neurological: Negative for headaches.  Hematological: Bruises/bleeds easily.  Psychiatric/Behavioral: Negative for confusion.      Allergies  Daypro; Prednisone; and Tegretol  Home Medications   Prior to Admission medications   Medication Sig Start Date End Date Taking? Authorizing Provider  BYSTOLIC 5 MG tablet TAKE 1/2 TABLET BY MOUTH DAILY 02/12/14   Burnell Blanks, MD  Calcium Carbonate (  CALCIUM 600 PO) Take 1 tablet by mouth daily.      Historical Provider, MD  cholecalciferol (VITAMIN D) 1000 UNITS tablet Take 1,000 Units by mouth daily.    Historical Provider, MD  digoxin (LANOXIN) 0.125 MG tablet TAKE 1 TABLET BY MOUTH DAILY 03/28/14   Burnell Blanks, MD  fish oil-omega-3 fatty acids 1000 MG capsule Take 1 g by mouth daily.     Historical Provider, MD  fluticasone (FLONASE) 50 MCG/ACT nasal spray Place 2 sprays into the nose daily.    Historical Provider, MD  Glucosamine 500 MG  TABS Take 2 tablets by mouth daily.     Historical Provider, MD  ipratropium (ATROVENT) 0.03 % nasal spray Place 2 sprays into the nose every 12 (twelve) hours.    Historical Provider, MD  levothyroxine (SYNTHROID, LEVOTHROID) 150 MCG tablet Take 150 mcg by mouth daily before breakfast. 6 DAYS A WEEK    Historical Provider, MD  Multiple Vitamin (MULTIVITAMIN PO) Take 1 tablet by mouth daily.      Historical Provider, MD  Multiple Vitamins-Minerals (OCUVITE PO) Take by mouth.      Historical Provider, MD  omeprazole (PRILOSEC) 20 MG capsule Take 20 mg by mouth daily.    Historical Provider, MD  potassium chloride SA (K-DUR,KLOR-CON) 20 MEQ tablet Take 20 mEq by mouth. Take 40 mg two days a week, 60 mg 5 days a week    Historical Provider, MD  simvastatin (ZOCOR) 40 MG tablet Take 40 mg by mouth at bedtime.      Historical Provider, MD  triamterene-hydrochlorothiazide (MAXZIDE) 75-50 MG per tablet Take 1 tablet by mouth daily.      Historical Provider, MD  XARELTO 20 MG TABS tablet TAKE 1 TABLET BY MOUTH DAILY WITH SUPPER 02/20/14   Burnell Blanks, MD   BP 105/59 mmHg  Pulse 55  Temp(Src) 97.7 F (36.5 C) (Oral)  Resp 13  Ht 5' 9.5" (1.765 m)  Wt 153 lb (69.4 kg)  BMI 22.28 kg/m2  SpO2 99% Physical Exam  Constitutional: She is oriented to person, place, and time. She appears well-developed and well-nourished. No distress.  HENT:  Head: Normocephalic and atraumatic.  Mouth/Throat: Oropharynx is clear and moist.  Eyes: Conjunctivae and EOM are normal. Pupils are equal, round, and reactive to light.  Neck: Normal range of motion.  Cardiovascular: Normal rate, regular rhythm and normal heart sounds.   No murmur heard. Pulmonary/Chest: Effort normal and breath sounds normal. No respiratory distress.  Abdominal: Soft. Bowel sounds are normal. There is no tenderness.  Musculoskeletal: Normal range of motion.  Neurological: She is alert and oriented to person, place, and time. No  cranial nerve deficit. She exhibits normal muscle tone. Coordination normal.  Skin: Skin is warm. No rash noted.  Nursing note and vitals reviewed.   ED Course  Procedures (including critical care time) Labs Review Labs Reviewed  CBC WITH DIFFERENTIAL/PLATELET - Abnormal; Notable for the following:    RBC 5.40 (*)    HCT 46.7 (*)    All other components within normal limits  BASIC METABOLIC PANEL - Abnormal; Notable for the following:    Glucose, Bld 177 (*)    GFR calc non Af Amer 57 (*)    All other components within normal limits  I-STAT TROPOININ, ED  I-STAT TROPOININ, ED    Imaging Review Ct Chest Wo Contrast  11/22/2014   CLINICAL DATA:  Shortness of breath, left-sided chest pain. History of left breast cancer status post  mastectomy and thyroid cancer. History of melanoma.  EXAM: CT CHEST WITHOUT CONTRAST  TECHNIQUE: Multidetector CT imaging of the chest was performed following the standard protocol without IV contrast.  COMPARISON:  Chest radiograph same date, chest CT 04/28/2007  FINDINGS: Mediastinum/Nodes: Heart size is normal. Trace fluid in the superior pericardial recess. Representative small pretracheal node measures 7 mm image 17. Great vessels are normal in caliber.  Interval decrease in size of high pretracheal node measuring 0.6 cm image 12. Stable appearing right axillary lymph node with diffuse mild cortical prominence.  Surgical clips from presumed prior thyroidectomy noted.  Lungs/Pleura: Biapical pleural thickening reidentified. Streak artifact from cervical fusion hardware partly visualized. Thin curvilinear left lower lobe scarring or atelectasis is new since the prior remote exam. No new measurable pulmonary nodule, mass, or consolidation.  Upper abdomen: Adrenal glands appear normal.  Musculoskeletal: Cervical thoracic fusion hardware noted. Evidence of left breast myocutaneous flap reconstruction again noted. No acute osseous abnormality or lytic or sclerotic  osseous lesion.  IMPRESSION: No acute cardiopulmonary process.  Stable findings as above.   Electronically Signed   By: Conchita Paris M.D.   On: 11/22/2014 12:26   Dg Chest Port 1 View  11/22/2014   CLINICAL DATA:  Palpitations and shortness of breath since 4 a.m. today.  EXAM: PORTABLE CHEST - 1 VIEW  COMPARISON:  Two-view chest x-ray 04/03/2014  FINDINGS: The heart size is exaggerated by low lung volumes. Mild left basilar airspace opacity is noted. The lungs are otherwise clear. There is no edema or effusion to suggest failure. Neck no other focal airspace disease is present.  Surgical clips are again seen in the left breast and left axilla. Interval posterior cervical thoracic fusion is noted.  Rightward curvature of the thoracic spine is again noted. The visual soft tissues and bony thorax are otherwise unremarkable.  IMPRESSION: 1. Low lung volumes and left basilar airspace disease. While this likely represents atelectasis, early infection is also considered. 2. Interval posterior fusion of the lower cervical and upper thoracic spine.   Electronically Signed   By: San Morelle M.D.   On: 11/22/2014 10:54   I have personally reviewed and evaluated these images and lab results as part of my medical decision-making.   EKG Interpretation   Date/Time:  Friday November 22 2014 09:48:26 EDT Ventricular Rate:  85 PR Interval:  148 QRS Duration: 78 QT Interval:  342 QTC Calculation: 407 R Axis:   -31 Text Interpretation:  Sinus rhythm Left axis deviation Minimal ST  depression, anterolateral leads Baseline wander in lead(s) II Confirmed by  ZACKOWSKI  MD, SCOTT ([id number]) on 11/22/2014 10:04:13 AM      MDM   Final diagnoses:  Atrial fibrillation with RVR    Patient with known history of atrial fibrillation. Has been on digoxin for long-term for this. Is also on blood thinners for this. Patient with a prolonged period of rapid rate atrial fibrillation with chest pressure that resolved  spontaneously here. They've gone on for several hours. Patient's chest pressure had resolved when the heart rate went down troponins have been negative 2. Patient has remained in sinus rhythm since converting. Labs without any significant abnormalities. Patient's followed by cardiology and will make an appointment to see them in the office. Patient will return for any new or worse symptoms.  Chest x-ray raises concerns for possible pneumonia CT chest rule that out. Patient without any fevers or leukocytosis.       Fredia Sorrow, MD 11/22/14 [phone]

## 2014-11-22 NOTE — Discharge Instructions (Signed)
Return for any recurrent fast heart rate that does not resolve in 40 minutes. Return for any new or worse symptoms. Make an appointment to follow-up with your cardiologist. Continue current medications.

## 2014-12-09 ENCOUNTER — Encounter: Payer: Self-pay | Admitting: Cardiovascular Disease

## 2014-12-09 ENCOUNTER — Ambulatory Visit (INDEPENDENT_AMBULATORY_CARE_PROVIDER_SITE_OTHER): Payer: Medicare Other | Admitting: Cardiovascular Disease

## 2014-12-09 VITALS — BP 126/82 | HR 57 | Ht 69.0 in | Wt 160.0 lb

## 2014-12-09 DIAGNOSIS — I480 Paroxysmal atrial fibrillation: Secondary | ICD-10-CM

## 2014-12-09 NOTE — Progress Notes (Signed)
Chief Complaint  Patient presents with  . Palpitations      History of Present Illness: 74 yo WF with history of paroxysmal atrial fibrillation, SVT, HTN, breast cancer and atypical chest pain who is here for routine cardiac follow up. She has been followed in the past by Dr. Bea Laura. Echo June 2012 with normal LV function and no valve disease. She has paroxysmal atrial fibrillation but has only had rare occurrences over the past ten years. She feels tightness in the chest and neck, shakiness, and palpitations when she has atrial fibrillation. She was started on Xarelto in 2015. Event monitor August 2014 with several short runs of SVT but no evidence of atrial fibrillation. She was seen in the ED 11/22/14 with palpitations, nausea and SOB and was found to be in atrial fibrillation with RVR. She converted to sinus in the ED spontaneously. She has been following in primary care for her thyroid disease and her Synthroid dose is being adjusted.   She is here for follow up. No chest pain or SOB. No near syncope or syncope.  Rare palpitations over last 2 weeks.   Primary Care Physician: Perini  Last Lipid Profile: Lipids followed in primary care.   Past Medical History  Diagnosis Date  . Arrhythmia     HISTORY OF PAROXYSMAL AFIB   . Hypertension   . Atypical chest pain   . H/O: hysterectomy   . Melanoma ([place]) 1991    REMOVED FROM LEFT LEG IN 1991  . Breast cancer ([place])     H/O LEFT MASTECTOMY IN 2000  . Thyroid cancer [facility])     s/p THYROIDECTOMY    Past Surgical History  Procedure Laterality Date  . Cardiac catheterization  04/21/07    GLOBAL ESTIMATED EF IS 60 %; NORMAL LV FUNCTION; NORMAL CORONARY ARTERIES; BORDERLINE INCREASE IN THE AORTIC ROOT  . Mastectomy  2000    LEFT BREAST  . Melanoma excision  1991    REMOVED FROM LEFT LEG  . Thyroidectomy    . Cystoscopy  2007    anterior repair of cystocele with mesh  .  tarsal tunnel release  2002    Right/Left  tarsal tunnel  release    Current Outpatient Prescriptions  Medication Sig Dispense Refill  . BYSTOLIC 5 MG tablet TAKE 1/2 TABLET BY MOUTH DAILY 30 tablet 1  . digoxin (LANOXIN) 0.125 MG tablet TAKE 1 TABLET BY MOUTH DAILY 30 tablet 11  . fish oil-omega-3 fatty acids 1000 MG capsule Take 1 g by mouth daily.     . fluticasone (FLONASE) 50 MCG/ACT nasal spray Place 2 sprays into the nose daily.    . Glucosamine 500 MG TABS Take 2 tablets by mouth daily.     Marland Kitchen ipratropium (ATROVENT) 0.03 % nasal spray Place 2 sprays into the nose every 12 (twelve) hours.    Marland Kitchen levothyroxine (SYNTHROID, LEVOTHROID) 150 MCG tablet Take 150 mcg by mouth daily before breakfast. 6 DAYS A WEEK    . omeprazole (PRILOSEC) 20 MG capsule Take 20 mg by mouth daily.    . potassium chloride SA (K-DUR,KLOR-CON) 20 MEQ tablet Take 20 mEq by mouth. Take 40 mg two days a week, 60 mg 5 days a week    . simvastatin (ZOCOR) 40 MG tablet Take 40 mg by mouth at bedtime.      . triamterene-hydrochlorothiazide (MAXZIDE) 75-50 MG per tablet Take 1 tablet by mouth daily.      Alveda Reasons 20 MG TABS tablet  TAKE 1 TABLET BY MOUTH DAILY WITH SUPPER 30 tablet 11   No current facility-administered medications for this visit.    Allergies  Allergen Reactions  . Daypro [Oxaprozin] Hives  . Prednisone Other (See Comments)    leg swelling /dyuria  . Tegretol [Carbamazepine] Hives    Social History   Social History  . Marital Status: Married    Spouse Name: N/A  . Number of Children: N/A  . Years of Education: N/A   Occupational History  . [employer]   Social History Main Topics  . Smoking status: Never Smoker   . Smokeless tobacco: Not on file  . Alcohol Use: Yes     Comment: RARE  . Drug Use: No  . Sexual Activity: Not on file   Other Topics Concern  . Not on file   Social History Narrative   RETIRED FROM [facility]   LIVES @ HOME WITH HER HUSBAND   SHE HAS CO-AUTHORED BOOKS ON IV ADDITIVES    ETOH RARELY   NON-SMOKER          Family History  Problem Relation Age of Onset  . Hypertension Father   . Macular degeneration Father     Review of Systems:  As stated in the HPI and otherwise negative.   BP 126/82 mmHg  Pulse 57  Ht 5\' 9"  (1.753 m)  Wt 160 lb (72.576 kg)  BMI 23.62 kg/m2  SpO2 96%  Physical Examination: General: Well developed, well nourished, NAD HEENT: OP clear, mucus membranes moist SKIN: warm, dry. No rashes. Neuro: No focal deficits Musculoskeletal: Muscle strength 5/5 all ext Psychiatric: Mood and affect normal Neck: No JVD, no carotid bruits, no thyromegaly, no lymphadenopathy. Lungs:Clear bilaterally, no wheezes, rhonci, crackles Cardiovascular: Regular rate and rhythm. No murmurs, gallops or rubs. Abdomen:Soft. Bowel sounds present. Non-tender.  Extremities: No lower extremity edema. Pulses are 2 + in the bilateral DP/PT  EKG:  EKG is ordered today. The ekg ordered today demonstrates sinus brady, rate 57 bpm  Recent Labs: 11/22/2014: BUN 19; Creatinine, Ser 0.96; Hemoglobin 14.9; Platelets 209; Potassium 3.6; Sodium 144   Lipid Panel No results found for: CHOL, TRIG, HDL, CHOLHDL, VLDL, LDLCALC, LDLDIRECT   Wt Readings from Last 3 Encounters:  12/09/14 160 lb (72.576 kg)  11/22/14 153 lb (69.4 kg)  03/19/14 163 lb 9.6 oz (74.208 kg)     Other studies Reviewed: Additional studies/ records that were reviewed today include: . Review of the above records demonstrates:    Assessment and Plan:   1. Atrial fibrillation, paroxysmal: She is on Bystolic and Digoxin. She is anti-coagulated with Xarelto. She had one occurrence of atrial fib two weeks ago. Feeling better. We have discussed advanced therapies including anti-arrhythmics and ablation. Since she is having only rare occurrences, will not change plans at this time.   Current medicines are reviewed at length with the patient today.  The patient does not have concerns regarding  medicines.  The following changes have been made:  no change  Labs/ tests ordered today include:   Orders Placed This Encounter  Procedures  . EKG 12-Lead     Disposition:   FU with me in 6 months   Signed, Lauree Chandler, MD 12/09/2014 9:36 AM    [facility] [place], [place], [place]  [address] Phone: [phone]; Fax: [phone]

## 2014-12-09 NOTE — Patient Instructions (Signed)

## 2015-02-23 ENCOUNTER — Other Ambulatory Visit: Payer: Self-pay | Admitting: Cardiovascular Disease

## 2015-04-11 ENCOUNTER — Other Ambulatory Visit: Payer: Self-pay | Admitting: Cardiovascular Disease

## 2015-04-14 NOTE — Progress Notes (Signed)
Chief Complaint  Patient presents with  . Follow-up    History of Present Illness: 75 yo WF with history of paroxysmal atrial fibrillation, SVT, HTN, breast cancer and atypical chest pain who is here for routine cardiac follow up. She has been followed in the past by Dr. Bea Laura. Echo June 2012 with normal LV function and no valve disease. She has paroxysmal atrial fibrillation but has only had rare occurrences over the past ten years. She feels tightness in the chest and neck, shakiness, and palpitations when she has atrial fibrillation. She was started on Xarelto in 2015. Event monitor August 2014 with several short runs of SVT but no evidence of atrial fibrillation. She was seen in the ED 11/22/14 with palpitations, nausea and SOB and was found to be in atrial fibrillation with RVR. She converted to sinus in the ED spontaneously. She has been following in primary care for her thyroid disease and her Synthroid dose is being adjusted.   She is here for follow up. No chest pain or SOB. No near syncope or syncope.  Rare palpitations.    Primary Care Physician: Perini  Last Lipid Profile: Lipids followed in primary care.   Past Medical History  Diagnosis Date  . Arrhythmia     HISTORY OF PAROXYSMAL AFIB   . Hypertension   . Atypical chest pain   . H/O: hysterectomy   . Melanoma ([place]) 1991    REMOVED FROM LEFT LEG IN 1991  . Breast cancer ([place])     H/O LEFT MASTECTOMY IN 2000  . Thyroid cancer [facility])     s/p THYROIDECTOMY    Past Surgical History  Procedure Laterality Date  . Cardiac catheterization  04/21/07    GLOBAL ESTIMATED EF IS 60 %; NORMAL LV FUNCTION; NORMAL CORONARY ARTERIES; BORDERLINE INCREASE IN THE AORTIC ROOT  . Mastectomy  2000    LEFT BREAST  . Melanoma excision  1991    REMOVED FROM LEFT LEG  . Thyroidectomy    . Cystoscopy  2007    anterior repair of cystocele with mesh  .  tarsal tunnel release  2002    Right/Left  tarsal tunnel release    Current  Outpatient Prescriptions  Medication Sig Dispense Refill  . BYSTOLIC 5 MG tablet TAKE 1/2 TABLET BY MOUTH DAILY 30 tablet 1  . digoxin (LANOXIN) 0.125 MG tablet TAKE 1 TABLET BY MOUTH DAILY 30 tablet 6  . fish oil-omega-3 fatty acids 1000 MG capsule Take 1 g by mouth daily.     . fluticasone (FLONASE) 50 MCG/ACT nasal spray Place 2 sprays into the nose daily.    . Glucosamine 500 MG TABS Take 2 tablets by mouth daily.     Marland Kitchen ipratropium (ATROVENT) 0.03 % nasal spray Place 2 sprays into the nose every 12 (twelve) hours.    Marland Kitchen levothyroxine (SYNTHROID, LEVOTHROID) 150 MCG tablet Take 150 mcg by mouth daily before breakfast. 6 DAYS A WEEK    . omeprazole (PRILOSEC) 20 MG capsule Take 20 mg by mouth daily.    . potassium chloride SA (K-DUR,KLOR-CON) 20 MEQ tablet Take 40 mEq by mouth daily.     . simvastatin (ZOCOR) 40 MG tablet Take 40 mg by mouth at bedtime.      . triamterene-hydrochlorothiazide (MAXZIDE) 75-50 MG per tablet Take 1 tablet by mouth daily.      Alveda Reasons 20 MG TABS tablet TAKE ONE TABLET BY MOUTH EACH EVENING WITH DINNER (Patient taking differently: TAKE 1/2 TABLET BY  MOUTH EACH EVENING WITH DINNER) 30 tablet 9   No current facility-administered medications for this visit.    Allergies  Allergen Reactions  . Daypro [Oxaprozin] Hives  . Prednisone Other (See Comments)    leg swelling /dyuria  . Tegretol [Carbamazepine] Hives    Social History   Social History  . Marital Status: Married    Spouse Name: N/A  . Number of Children: N/A  . Years of Education: N/A   Occupational History  . [employer]   Social History Main Topics  . Smoking status: Never Smoker   . Smokeless tobacco: Not on file  . Alcohol Use: Yes     Comment: RARE  . Drug Use: No  . Sexual Activity: Not on file   Other Topics Concern  . Not on file   Social History Narrative   RETIRED FROM [facility]   LIVES @ HOME WITH HER HUSBAND   SHE HAS  CO-AUTHORED BOOKS ON IV ADDITIVES   ETOH RARELY   NON-SMOKER          Family History  Problem Relation Age of Onset  . Hypertension Father   . Macular degeneration Father     Review of Systems:  As stated in the HPI and otherwise negative.   BP 122/82 mmHg  Pulse 66  Ht 5\' 9"  (1.753 m)  Wt 160 lb 12.8 oz (72.938 kg)  BMI 23.74 kg/m2  Physical Examination: General: Well developed, well nourished, NAD HEENT: OP clear, mucus membranes moist SKIN: warm, dry. No rashes. Neuro: No focal deficits Musculoskeletal: Muscle strength 5/5 all ext Psychiatric: Mood and affect normal Neck: No JVD, no carotid bruits, no thyromegaly, no lymphadenopathy. Lungs:Clear bilaterally, no wheezes, rhonci, crackles Cardiovascular: Regular rate and rhythm. No murmurs, gallops or rubs. Abdomen:Soft. Bowel sounds present. Non-tender.  Extremities: No lower extremity edema. Pulses are 2 + in the bilateral DP/PT  EKG:  EKG is ordered today. The ekg ordered today demonstrates sinus brady, rate 57 bpm  Recent Labs: 11/22/2014: BUN 19; Creatinine, Ser 0.96; Hemoglobin 14.9; Platelets 209; Potassium 3.6; Sodium 144   Lipid Panel No results found for: CHOL, TRIG, HDL, CHOLHDL, VLDL, LDLCALC, LDLDIRECT   Wt Readings from Last 3 Encounters:  04/15/15 160 lb 12.8 oz (72.938 kg)  12/09/14 160 lb (72.576 kg)  11/22/14 153 lb (69.4 kg)     Other studies Reviewed: Additional studies/ records that were reviewed today include: . Review of the above records demonstrates:    Assessment and Plan:   1. Atrial fibrillation, paroxysmal: No recent episodes. She is on Bystolic and Digoxin. She is anti-coagulated with Xarelto. We have discussed advanced therapies including anti-arrhythmics and ablation if she has frequent recurrences.    Current medicines are reviewed at length with the patient today.  The patient does not have concerns regarding medicines.  The following changes have been made:  no  change  Labs/ tests ordered today include:   No orders of the defined types were placed in this encounter.    Disposition:   FU with me in 12 months  Signed, Lauree Chandler, MD 04/15/2015 9:17 AM    [facility] [place], [place], [place]  [address] Phone: [phone]; Fax: [phone]

## 2015-04-15 ENCOUNTER — Encounter: Payer: Self-pay | Admitting: Cardiovascular Disease

## 2015-04-15 ENCOUNTER — Ambulatory Visit (INDEPENDENT_AMBULATORY_CARE_PROVIDER_SITE_OTHER): Payer: Medicare Other | Admitting: Cardiovascular Disease

## 2015-04-15 VITALS — BP 122/82 | HR 66 | Ht 69.0 in | Wt 160.8 lb

## 2015-04-15 DIAGNOSIS — I480 Paroxysmal atrial fibrillation: Secondary | ICD-10-CM

## 2015-04-15 NOTE — Patient Instructions (Signed)

## 2015-04-25 DIAGNOSIS — E038 Other specified hypothyroidism: Secondary | ICD-10-CM | POA: Diagnosis not present

## 2015-04-25 DIAGNOSIS — E784 Other hyperlipidemia: Secondary | ICD-10-CM | POA: Diagnosis not present

## 2015-04-25 DIAGNOSIS — I10 Essential (primary) hypertension: Secondary | ICD-10-CM | POA: Diagnosis not present

## 2015-05-02 DIAGNOSIS — E784 Other hyperlipidemia: Secondary | ICD-10-CM | POA: Diagnosis not present

## 2015-05-02 DIAGNOSIS — Z1231 Encounter for screening mammogram for malignant neoplasm of breast: Secondary | ICD-10-CM | POA: Diagnosis not present

## 2015-05-02 DIAGNOSIS — R600 Localized edema: Secondary | ICD-10-CM | POA: Diagnosis not present

## 2015-05-02 DIAGNOSIS — E038 Other specified hypothyroidism: Secondary | ICD-10-CM | POA: Diagnosis not present

## 2015-05-02 DIAGNOSIS — Z780 Asymptomatic menopausal state: Secondary | ICD-10-CM | POA: Diagnosis not present

## 2015-05-02 DIAGNOSIS — E876 Hypokalemia: Secondary | ICD-10-CM | POA: Diagnosis not present

## 2015-05-02 DIAGNOSIS — Z0000 Encounter for general adult medical examination without abnormal findings: Secondary | ICD-10-CM | POA: Diagnosis not present

## 2015-05-02 DIAGNOSIS — I10 Essential (primary) hypertension: Secondary | ICD-10-CM | POA: Diagnosis not present

## 2015-05-02 DIAGNOSIS — K219 Gastro-esophageal reflux disease without esophagitis: Secondary | ICD-10-CM | POA: Diagnosis not present

## 2015-05-02 DIAGNOSIS — M1990 Unspecified osteoarthritis, unspecified site: Secondary | ICD-10-CM | POA: Diagnosis not present

## 2015-05-06 DIAGNOSIS — M47812 Spondylosis without myelopathy or radiculopathy, cervical region: Secondary | ICD-10-CM | POA: Diagnosis not present

## 2015-05-06 DIAGNOSIS — M419 Scoliosis, unspecified: Secondary | ICD-10-CM | POA: Diagnosis not present

## 2015-05-06 DIAGNOSIS — M20039 Swan-neck deformity of unspecified finger(s): Secondary | ICD-10-CM | POA: Diagnosis not present

## 2015-07-03 DIAGNOSIS — Z9622 Myringotomy tube(s) status: Secondary | ICD-10-CM | POA: Diagnosis not present

## 2015-07-03 DIAGNOSIS — Z4589 Encounter for adjustment and management of other implanted devices: Secondary | ICD-10-CM | POA: Diagnosis not present

## 2015-07-18 ENCOUNTER — Ambulatory Visit: Payer: Medicare Other | Admitting: Cardiovascular Disease

## 2015-08-01 DIAGNOSIS — E784 Other hyperlipidemia: Secondary | ICD-10-CM | POA: Diagnosis not present

## 2015-08-01 DIAGNOSIS — Z79899 Other long term (current) drug therapy: Secondary | ICD-10-CM | POA: Diagnosis not present

## 2015-08-01 DIAGNOSIS — I480 Paroxysmal atrial fibrillation: Secondary | ICD-10-CM | POA: Diagnosis not present

## 2015-08-01 DIAGNOSIS — I10 Essential (primary) hypertension: Secondary | ICD-10-CM | POA: Diagnosis not present

## 2015-08-01 DIAGNOSIS — C73 Malignant neoplasm of thyroid gland: Secondary | ICD-10-CM | POA: Diagnosis not present

## 2015-08-28 ENCOUNTER — Other Ambulatory Visit: Payer: Self-pay | Admitting: Internal Medicine

## 2015-08-28 ENCOUNTER — Telehealth: Payer: Self-pay | Admitting: Cardiovascular Disease

## 2015-08-28 DIAGNOSIS — I480 Paroxysmal atrial fibrillation: Secondary | ICD-10-CM

## 2015-08-28 DIAGNOSIS — R002 Palpitations: Secondary | ICD-10-CM

## 2015-08-28 DIAGNOSIS — Z1231 Encounter for screening mammogram for malignant neoplasm of breast: Secondary | ICD-10-CM

## 2015-08-28 DIAGNOSIS — Z9012 Acquired absence of left breast and nipple: Secondary | ICD-10-CM

## 2015-08-28 NOTE — Telephone Encounter (Signed)
Pt calling to report for the last 2 to 3 weeks she has been experiencing feeling of skipped beats along with weakness, feeling faint, shaky and some nausea on occasion.  She reports it doesn't feel like her At Fib because her At Fib feels like her heart is pounding and she has not been feeling this way.  She sees Dr Joylene Draft as her PCP who recently did lab work and decreased her Digoxin dose as a results.  She reports this change in medication occurred approximately 2 to 3 weeks ago as well and was decreased to 0.0625 mg a day.  Advised I will review information with Dr Angelena Form who may order a monitor to determine her underlying rhythm.  offered her an appointment with an APP here for further evaluation or suggested she f/u with Dr Joylene Draft since he recently made medication changes.  She states she will contact Dr .Silvestre Mesi office to start.  She is due for a 1 yr follow up with Dr Angelena Form.

## 2015-08-28 NOTE — Telephone Encounter (Signed)
Can we contact her and arrange a 48 hour monitor? Thanks, chris

## 2015-08-28 NOTE — Telephone Encounter (Signed)
Patient c/o Palpitations:  High priority if patient c/o lightheadedness and shortness of breath.  1. How long have you been having palpitations? month 2. Are you currently experiencing lightheadedness and shortness of breath? no  3. Have you checked your BP and heart rate? (document readings)  HR 70 in 30 seconds it skipped 3 beats, its not faint 4. Are you experiencing any other symptoms? 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

## 2015-08-29 DIAGNOSIS — I480 Paroxysmal atrial fibrillation: Secondary | ICD-10-CM | POA: Diagnosis not present

## 2015-08-29 DIAGNOSIS — E876 Hypokalemia: Secondary | ICD-10-CM | POA: Diagnosis not present

## 2015-08-29 DIAGNOSIS — C73 Malignant neoplasm of thyroid gland: Secondary | ICD-10-CM | POA: Diagnosis not present

## 2015-08-29 NOTE — Telephone Encounter (Signed)
Pt aware or orders and is agreeable.  She is aware someone will be calling her to be scheduled for the placement of the 48 hr heart monitor.

## 2015-08-29 NOTE — Telephone Encounter (Signed)
Left message for pt to call back to discuss orders.

## 2015-09-03 ENCOUNTER — Ambulatory Visit (INDEPENDENT_AMBULATORY_CARE_PROVIDER_SITE_OTHER): Payer: Medicare Other

## 2015-09-03 DIAGNOSIS — R002 Palpitations: Secondary | ICD-10-CM

## 2015-09-03 DIAGNOSIS — I480 Paroxysmal atrial fibrillation: Secondary | ICD-10-CM

## 2015-09-05 DIAGNOSIS — R002 Palpitations: Secondary | ICD-10-CM | POA: Diagnosis not present

## 2015-09-05 DIAGNOSIS — I480 Paroxysmal atrial fibrillation: Secondary | ICD-10-CM | POA: Diagnosis not present

## 2015-09-11 ENCOUNTER — Ambulatory Visit
Admission: RE | Admit: 2015-09-11 | Discharge: 2015-09-11 | Disposition: A | Discharge status: Home / Self Care | Payer: Medicare Other | Source: Ambulatory Visit | Attending: Internal Medicine | Admitting: Internal Medicine

## 2015-09-11 DIAGNOSIS — Z9012 Acquired absence of left breast and nipple: Secondary | ICD-10-CM

## 2015-09-11 DIAGNOSIS — Z1231 Encounter for screening mammogram for malignant neoplasm of breast: Secondary | ICD-10-CM

## 2015-09-15 ENCOUNTER — Encounter: Payer: Self-pay | Admitting: Cardiovascular Disease

## 2015-09-15 ENCOUNTER — Telehealth: Payer: Self-pay | Admitting: Cardiovascular Disease

## 2015-09-15 NOTE — Telephone Encounter (Signed)
Spoke with pt, she had talked to pat on Friday and was calling back to let us know how she has been doing since the decrease in her digoxin. She reports no change in her symptoms and she feels they have gotten worse. She c/o weakness and fluttering in her chest, occ SOB and nausea. She feels the fluttering episodes are becoming more frequent. Her digoxin level was high and dr perini decreased her digoxin dose to 0.625 mg once daily. She had a repeat level drawn and no medication changes were made by dr perini. She is not sure if she needs to be seen again or not. Will forward to dr Angelena Form for review and advise.

## 2015-09-15 NOTE — Telephone Encounter (Signed)
Spoke with pt, she would like to be seen tomorrow. Follow up scheduled

## 2015-09-15 NOTE — Telephone Encounter (Signed)
Can we check with her and see if she would be willing to stop Digoxin and start Cardizem CD 120 mg daily. This may lessen her awareness of PVCs. I would be glad to see her tomorrow morning if she wishes. My office schedule has been opened up for the am.   Thanks,  Gerald Stabs

## 2015-09-15 NOTE — Telephone Encounter (Signed)
F/u  Pt returning RN phone call- concerning monitor results- requested to speak w/ RN about her medications. Please call back and discuss.

## 2015-09-16 ENCOUNTER — Ambulatory Visit (INDEPENDENT_AMBULATORY_CARE_PROVIDER_SITE_OTHER): Payer: Medicare Other | Admitting: Cardiovascular Disease

## 2015-09-16 ENCOUNTER — Encounter: Payer: Self-pay | Admitting: Cardiovascular Disease

## 2015-09-16 ENCOUNTER — Encounter: Payer: Self-pay | Admitting: *Deleted

## 2015-09-16 VITALS — BP 132/74 | HR 73 | Ht 69.0 in | Wt 160.1 lb

## 2015-09-16 DIAGNOSIS — I480 Paroxysmal atrial fibrillation: Secondary | ICD-10-CM | POA: Diagnosis not present

## 2015-09-16 DIAGNOSIS — R002 Palpitations: Secondary | ICD-10-CM | POA: Insufficient documentation

## 2015-09-16 DIAGNOSIS — R0600 Dyspnea, unspecified: Secondary | ICD-10-CM | POA: Diagnosis not present

## 2015-09-16 DIAGNOSIS — I200 Unstable angina: Secondary | ICD-10-CM | POA: Diagnosis not present

## 2015-09-16 LAB — CBC WITH DIFFERENTIAL/PLATELET
BASOS PCT: 0 %
Basophils Absolute: 0 cells/uL (ref 0–200)
Eosinophils Absolute: 146 cells/uL (ref 15–500)
Eosinophils Relative: 2 %
HEMATOCRIT: 44.4 % (ref 35.0–45.0)
HEMOGLOBIN: 14.5 g/dL (ref 11.7–15.5)
LYMPHS ABS: 1606 {cells}/uL (ref 850–3900)
Lymphocytes Relative: 22 %
MCH: 28.2 pg (ref 27.0–33.0)
MCHC: 32.7 g/dL (ref 32.0–36.0)
MCV: 86.4 fL (ref 80.0–100.0)
MONO ABS: 438 {cells}/uL (ref 200–950)
MPV: 10.3 fL (ref 7.5–12.5)
Monocytes Relative: 6 %
Neutro Abs: 5110 cells/uL (ref 1500–7800)
Neutrophils Relative %: 70 %
Platelets: 216 10*3/uL (ref 140–400)
RBC: 5.14 MIL/uL — AB (ref 3.80–5.10)
RDW: 14.4 % (ref 11.0–15.0)
WBC: 7.3 10*3/uL (ref 3.8–10.8)

## 2015-09-16 LAB — BASIC METABOLIC PANEL
BUN: 22 mg/dL (ref 7–25)
CO2: 32 mmol/L — ABNORMAL HIGH (ref 20–31)
Calcium: 9.5 mg/dL (ref 8.6–10.4)
Chloride: 104 mmol/L (ref 98–110)
Creat: 0.99 mg/dL — ABNORMAL HIGH (ref 0.60–0.93)
Glucose, Bld: 83 mg/dL (ref 65–99)
POTASSIUM: 4 mmol/L (ref 3.5–5.3)
SODIUM: 146 mmol/L (ref 135–146)

## 2015-09-16 LAB — PROTIME-INR
INR: 1.1
Prothrombin Time: 12 s — ABNORMAL HIGH (ref 9.0–11.5)

## 2015-09-16 MED ORDER — DILTIAZEM HCL ER COATED BEADS 120 MG PO CP24: 120.0000 mg | ORAL_CAPSULE | Freq: Every day | ORAL | 3 refills | Status: DC

## 2015-09-16 NOTE — Progress Notes (Signed)
Chief Complaint  Patient presents with  . Chest Pain    History of Present Illness: 75 yo WF with history of paroxysmal atrial fibrillation, SVT, PVCs, HTN, breast cancer and atypical chest pain who is here for evaluation of palpitations.  She has been followed in the past by Dr. Bea Laura. Echo June 2012 with normal LV function and no valve disease. She has been known to have paroxysmal atrial fibrillation but has only had rare occurrences over the past ten years. She feels tightness in the chest and neck, shakiness, and palpitations when she has atrial fibrillation. She was started on Xarelto in 2015. Event monitor August 2014 with several short runs of SVT but no evidence of atrial fibrillation. She was seen in the ED 11/22/14 with palpitations, nausea and SOB and was found to be in atrial fibrillation with RVR. She converted to sinus in the ED spontaneously. She has been following in primary care for her thyroid disease and her Synthroid dose is being adjusted. She has reported more frequent episodes of palpitations and weakness over the past few months. Digoxin dose reduced in primary care due to elevated levels. 48 hour monitor with PVCs, PACs, and several short runs of SVT.   She is here for follow up. She feels well in the am. By mid morning, she begins to feel chest pressure, dyspnea and this is followed by weakness. She has no energy. No near syncope or syncope. She has worsening of chest tightness during the day with exertion. This resolves with rest.   Primary Care Physician: Jerlyn Ly, MD   Past Medical History:  Diagnosis Date  . Arrhythmia    HISTORY OF PAROXYSMAL AFIB   . Atypical chest pain   . Breast cancer ([place])    H/O LEFT MASTECTOMY IN 2000  . H/O: hysterectomy   . Hypertension   . Melanoma ([place]) 1991   REMOVED FROM LEFT LEG IN 1991  . Thyroid cancer [facility])    s/p THYROIDECTOMY    Past Surgical History:  Procedure Laterality Date  .  tarsal tunnel release   2002   Right/Left  tarsal tunnel release  . CARDIAC CATHETERIZATION  04/21/07   GLOBAL ESTIMATED EF IS 60 %; NORMAL LV FUNCTION; NORMAL CORONARY ARTERIES; BORDERLINE INCREASE IN THE AORTIC ROOT  . CYSTOSCOPY  2007   anterior repair of cystocele with mesh  . MASTECTOMY  2000   LEFT BREAST  . MELANOMA EXCISION  1991   REMOVED FROM LEFT LEG  . THYROIDECTOMY      Current Outpatient Prescriptions  Medication Sig Dispense Refill  . BYSTOLIC 5 MG tablet TAKE 1/2 TABLET BY MOUTH DAILY 30 tablet 1  . fish oil-omega-3 fatty acids 1000 MG capsule Take 1 g by mouth daily.     . fluticasone (FLONASE) 50 MCG/ACT nasal spray Place 2 sprays into the nose daily.    . Glucosamine 500 MG TABS Take 2 tablets by mouth daily.     Marland Kitchen ipratropium (ATROVENT) 0.03 % nasal spray Place 2 sprays into the nose every 12 (twelve) hours.    Marland Kitchen levothyroxine (SYNTHROID, LEVOTHROID) 150 MCG tablet Take 150 mcg by mouth daily before breakfast. 6 DAYS A WEEK    . omeprazole (PRILOSEC) 20 MG capsule Take 20 mg by mouth daily.    . potassium chloride SA (K-DUR,KLOR-CON) 20 MEQ tablet Take 40 mEq by mouth daily.     . simvastatin (ZOCOR) 40 MG tablet Take 40 mg by mouth at bedtime.      Marland Kitchen  triamterene-hydrochlorothiazide (MAXZIDE) 75-50 MG per tablet Take 1 tablet by mouth daily.      Alveda Reasons 20 MG TABS tablet TAKE ONE TABLET BY MOUTH EACH EVENING WITH DINNER (Patient taking differently: TAKE 1/2 TABLET BY MOUTH EACH EVENING WITH DINNER) 30 tablet 9  . diltiazem (CARDIZEM CD) 120 MG 24 hr capsule Take 1 capsule (120 mg total) by mouth daily. 90 capsule 3   No current facility-administered medications for this visit.     Allergies  Allergen Reactions  . Daypro [Oxaprozin] Hives and Other (See Comments)    Unknown  . Prednisone Other (See Comments) and Itching    leg swelling /dyuria Unknown leg swelling /dyuria  . Tegretol [Carbamazepine] Hives and Other (See Comments)    Unknown    Social History   Social History   . Marital status: Married    Spouse name: N/A  . Number of children: N/A  . Years of education: N/A   Occupational History  . [employer]   Social History Main Topics  . Smoking status: Never Smoker  . Smokeless tobacco: Never Used  . Alcohol use Yes     Comment: RARE  . Drug use: No  . Sexual activity: Not on file   Other Topics Concern  . Not on file   Social History Narrative   RETIRED FROM [facility]   LIVES @ HOME WITH HER HUSBAND   SHE HAS CO-AUTHORED BOOKS ON IV ADDITIVES   ETOH RARELY   NON-SMOKER          Family History  Problem Relation Age of Onset  . Hypertension Father   . Macular degeneration Father     Review of Systems:  As stated in the HPI and otherwise negative.   BP 132/74 (BP Location: Right Arm, Patient Position: Sitting, Cuff Size: Normal)   Pulse 73   Ht 5\' 9"  (1.753 m)   Wt 160 lb 1.9 oz (72.6 kg)   SpO2 97%   BMI 23.65 kg/m   Physical Examination: General: Well developed, well nourished, NAD  HEENT: OP clear, mucus membranes moist  SKIN: warm, dry. No rashes. Neuro: No focal deficits  Musculoskeletal: Muscle strength 5/5 all ext  Psychiatric: Mood and affect normal  Neck: No JVD, no carotid bruits, no thyromegaly, no lymphadenopathy.  Lungs:Clear bilaterally, no wheezes, rhonci, crackles Cardiovascular: Regular rate and rhythm. No murmurs, gallops or rubs. Abdomen:Soft. Bowel sounds present. Non-tender.  Extremities: No lower extremity edema. Pulses are 2 + in the bilateral DP/PT  EKG:  EKG is not ordered today. The ekg ordered today demonstrates  Recent Labs: 11/22/2014: BUN 19; Creatinine, Ser 0.96; Hemoglobin 14.9; Platelets 209; Potassium 3.6; Sodium 144   Lipid Panel No results found for: CHOL, TRIG, HDL, CHOLHDL, VLDL, LDLCALC, LDLDIRECT   Wt Readings from Last 3 Encounters:  09/16/15 160 lb 1.9 oz (72.6 kg)  04/15/15 160 lb 12.8 oz (72.9 kg)  12/09/14 160 lb (72.6 kg)      Other studies Reviewed: Additional studies/ records that were reviewed today include: . Review of the above records demonstrates:    Assessment and Plan:   1. Atrial fibrillation, paroxysmal/PVCs/SVT: No episodes of atrial fib documented on monitor. She is having PVCs and short runs of SVT. She is on Bystolic and now reduced dose Digoxin. She is anti-coagulated with Xarelto. I will stop her Digoxin and will add Cardizem CD 120 mg daily.  If we cannot control her symptoms with  PVCs on medical therapy, consider referral to EP.    2. Chest pain/Unstable angina: Will arrange cardiac cath this week. Risks and benefits reviewed. Pre-cath labs today. Hold Xarelto today and hold until after cath.    3. Dyspnea: Echo to exclude valve disease, assess LVEF  Current medicines are reviewed at length with the patient today.  The patient does not have concerns regarding medicines.  The following changes have been made:  no change  Labs/ tests ordered today include:   Orders Placed This Encounter  Procedures  . Basic metabolic panel  . CBC w/Diff  . INR/PT  . ECHOCARDIOGRAM COMPLETE    Disposition:   FU with me in 3 weeks.   Signed, Lauree Chandler, MD 09/16/2015 8:54 AM    [facility] [address], [place], [place]  [address] Phone: [phone]; Fax: [phone]

## 2015-09-16 NOTE — Patient Instructions (Addendum)
Medication Instructions:  1. STOP DIGOXIN  2. START CARDIZEM CD 120 MG DAILY; RX SENT  Labwork: 1. TODAY BMET, CBC W/DIFF, PT/INR  Testing/Procedures: Your physician has requested that you have a cardiac catheterization. Cardiac catheterization is used to diagnose and/or treat various heart conditions. Doctors may recommend this procedure for a number of different reasons. The most common reason is to evaluate chest pain. Chest pain can be a symptom of coronary artery disease (CAD), and cardiac catheterization can show whether plaque is narrowing or blocking your heart's arteries. This procedure is also used to evaluate the valves, as well as measure the blood flow and oxygen levels in different parts of your heart. For further information please visit [email or web address]. Please follow instruction sheet, as given.    Follow-Up: S/P CATH FOLLOW UP 10/09/15 @ 8:30 WITH CHRIS BERG, NP  Any Other Special Instructions Will Be Listed Below (If Applicable).     If you need a refill on your cardiac medications before your next appointment, please call your pharmacy.

## 2015-09-18 ENCOUNTER — Ambulatory Visit (HOSPITAL_COMMUNITY)
Admission: RE | Admit: 2015-09-18 | Discharge: 2015-09-18 | Disposition: A | Discharge status: Home / Self Care | Payer: Medicare Other | Source: Ambulatory Visit | Attending: Cardiovascular Disease | Admitting: Cardiovascular Disease

## 2015-09-18 ENCOUNTER — Encounter (HOSPITAL_COMMUNITY): Payer: Self-pay | Admitting: Cardiovascular Disease

## 2015-09-18 ENCOUNTER — Encounter (HOSPITAL_COMMUNITY)
Admission: RE | Disposition: A | Discharge status: Home / Self Care | Payer: Self-pay | Source: Ambulatory Visit | Attending: Cardiovascular Disease

## 2015-09-18 DIAGNOSIS — Z8249 Family history of ischemic heart disease and other diseases of the circulatory system: Secondary | ICD-10-CM | POA: Insufficient documentation

## 2015-09-18 DIAGNOSIS — Z853 Personal history of malignant neoplasm of breast: Secondary | ICD-10-CM | POA: Insufficient documentation

## 2015-09-18 DIAGNOSIS — I480 Paroxysmal atrial fibrillation: Secondary | ICD-10-CM | POA: Insufficient documentation

## 2015-09-18 DIAGNOSIS — I471 Supraventricular tachycardia: Secondary | ICD-10-CM | POA: Insufficient documentation

## 2015-09-18 DIAGNOSIS — Z85820 Personal history of malignant melanoma of skin: Secondary | ICD-10-CM | POA: Insufficient documentation

## 2015-09-18 DIAGNOSIS — R0789 Other chest pain: Secondary | ICD-10-CM | POA: Insufficient documentation

## 2015-09-18 DIAGNOSIS — I10 Essential (primary) hypertension: Secondary | ICD-10-CM | POA: Diagnosis not present

## 2015-09-18 DIAGNOSIS — Z85850 Personal history of malignant neoplasm of thyroid: Secondary | ICD-10-CM | POA: Insufficient documentation

## 2015-09-18 DIAGNOSIS — I200 Unstable angina: Secondary | ICD-10-CM

## 2015-09-18 DIAGNOSIS — E079 Disorder of thyroid, unspecified: Secondary | ICD-10-CM | POA: Diagnosis not present

## 2015-09-18 DIAGNOSIS — I2510 Atherosclerotic heart disease of native coronary artery without angina pectoris: Secondary | ICD-10-CM | POA: Diagnosis not present

## 2015-09-18 DIAGNOSIS — R072 Precordial pain: Secondary | ICD-10-CM

## 2015-09-18 DIAGNOSIS — I493 Ventricular premature depolarization: Secondary | ICD-10-CM | POA: Insufficient documentation

## 2015-09-18 DIAGNOSIS — Z7901 Long term (current) use of anticoagulants: Secondary | ICD-10-CM | POA: Insufficient documentation

## 2015-09-18 HISTORY — PX: CARDIAC CATHETERIZATION: SHX172

## 2015-09-18 SURGERY — LEFT HEART CATH AND CORONARY ANGIOGRAPHY
Anesthesia: LOCAL

## 2015-09-18 MED ORDER — ASPIRIN 81 MG PO CHEW
CHEWABLE_TABLET | ORAL | Status: AC
  Filled 2015-09-18: qty 1

## 2015-09-18 MED ORDER — VERAPAMIL HCL 2.5 MG/ML IV SOLN
INTRAVENOUS | Status: AC
  Filled 2015-09-18: qty 2

## 2015-09-18 MED ORDER — HYDRALAZINE HCL 20 MG/ML IJ SOLN: 10.0000 mg | Freq: Once | INTRAMUSCULAR | Status: DC | PRN

## 2015-09-18 MED ORDER — NEBIVOLOL HCL 2.5 MG PO TABS
2.5000 mg | ORAL_TABLET | Freq: Once | ORAL | Status: AC
  Administered 2015-09-18: 2.5 mg via ORAL
  Filled 2015-09-18: qty 1

## 2015-09-18 MED ORDER — DILTIAZEM HCL ER COATED BEADS 120 MG PO CP24
120.0000 mg | ORAL_CAPSULE | Freq: Once | ORAL | Status: AC
  Administered 2015-09-18: 120 mg via ORAL
  Filled 2015-09-18: qty 1

## 2015-09-18 MED ORDER — FENTANYL CITRATE (PF) 100 MCG/2ML IJ SOLN
INTRAMUSCULAR | Status: AC
  Filled 2015-09-18: qty 2

## 2015-09-18 MED ORDER — LIDOCAINE HCL (PF) 1 % IJ SOLN
INTRAMUSCULAR | Status: DC | PRN
  Administered 2015-09-18: 2 mL via INTRADERMAL

## 2015-09-18 MED ORDER — ASPIRIN 81 MG PO CHEW
81.0000 mg | CHEWABLE_TABLET | ORAL | Status: AC
  Administered 2015-09-18: 81 mg via ORAL

## 2015-09-18 MED ORDER — HEPARIN (PORCINE) IN NACL 2-0.9 UNIT/ML-% IJ SOLN
INTRAMUSCULAR | Status: DC | PRN
  Administered 2015-09-18: 1000 mL

## 2015-09-18 MED ORDER — IOPAMIDOL (ISOVUE-370) INJECTION 76%
INTRAVENOUS | Status: DC | PRN
  Administered 2015-09-18: 75 mL via INTRA_ARTERIAL

## 2015-09-18 MED ORDER — SODIUM CHLORIDE 0.9 % IV SOLN: 250.0000 mL | INTRAVENOUS | Status: DC | PRN

## 2015-09-18 MED ORDER — FENTANYL CITRATE (PF) 100 MCG/2ML IJ SOLN
INTRAMUSCULAR | Status: DC | PRN
  Administered 2015-09-18: 25 ug via INTRAVENOUS

## 2015-09-18 MED ORDER — SODIUM CHLORIDE 0.9% FLUSH: 3.0000 mL | Freq: Two times a day (BID) | INTRAVENOUS | Status: DC

## 2015-09-18 MED ORDER — MIDAZOLAM HCL 2 MG/2ML IJ SOLN
INTRAMUSCULAR | Status: AC
  Filled 2015-09-18: qty 2

## 2015-09-18 MED ORDER — IOPAMIDOL (ISOVUE-370) INJECTION 76%
INTRAVENOUS | Status: AC
  Filled 2015-09-18: qty 100

## 2015-09-18 MED ORDER — ASPIRIN 81 MG PO CHEW
CHEWABLE_TABLET | ORAL | Status: AC
  Administered 2015-09-18: 81 mg via ORAL
  Filled 2015-09-18: qty 1

## 2015-09-18 MED ORDER — LIDOCAINE HCL (PF) 1 % IJ SOLN
INTRAMUSCULAR | Status: AC
  Filled 2015-09-18: qty 30

## 2015-09-18 MED ORDER — HEPARIN SODIUM (PORCINE) 1000 UNIT/ML IJ SOLN
INTRAMUSCULAR | Status: AC
  Filled 2015-09-18: qty 1

## 2015-09-18 MED ORDER — SODIUM CHLORIDE 0.9% FLUSH: 3.0000 mL | INTRAVENOUS | Status: DC | PRN

## 2015-09-18 MED ORDER — HEPARIN (PORCINE) IN NACL 2-0.9 UNIT/ML-% IJ SOLN
INTRAMUSCULAR | Status: AC
  Filled 2015-09-18: qty 1000

## 2015-09-18 MED ORDER — VERAPAMIL HCL 2.5 MG/ML IV SOLN
INTRAVENOUS | Status: DC | PRN
  Administered 2015-09-18: 10 mL via INTRA_ARTERIAL

## 2015-09-18 MED ORDER — SODIUM CHLORIDE 0.9 % IV SOLN
INTRAVENOUS | Status: AC
  Administered 2015-09-18: 08:00:00 via INTRAVENOUS

## 2015-09-18 MED ORDER — SODIUM CHLORIDE 0.9 % IV SOLN: INTRAVENOUS | Status: AC

## 2015-09-18 MED ORDER — HEPARIN SODIUM (PORCINE) 1000 UNIT/ML IJ SOLN
INTRAMUSCULAR | Status: DC | PRN
  Administered 2015-09-18: 3500 [IU] via INTRAVENOUS

## 2015-09-18 MED ORDER — MIDAZOLAM HCL 2 MG/2ML IJ SOLN
INTRAMUSCULAR | Status: DC | PRN
  Administered 2015-09-18: 2 mg via INTRAVENOUS

## 2015-09-18 SURGICAL SUPPLY — 13 items
CATH INFINITI 5 FR JL3.5 (CATHETERS) ×1 IMPLANT
CATH INFINITI 5FR ANG PIGTAIL (CATHETERS) ×1 IMPLANT
CATH INFINITI 5FR JL4 (CATHETERS) ×1 IMPLANT
CATH INFINITI JR4 5F (CATHETERS) ×1 IMPLANT
DEVICE RAD COMP TR BAND LRG (VASCULAR PRODUCTS) ×1 IMPLANT
GLIDESHEATH SLEND SS 6F .021 (SHEATH) ×1 IMPLANT
KIT HEART LEFT (KITS) ×2 IMPLANT
PACK CARDIAC CATHETERIZATION (CUSTOM PROCEDURE TRAY) ×2 IMPLANT
SYR MEDRAD MARK V 150ML (SYRINGE) ×2 IMPLANT
TRANSDUCER W/STOPCOCK (MISCELLANEOUS) ×2 IMPLANT
TUBING CIL FLEX 10 FLL-RA (TUBING) ×2 IMPLANT
WIRE HI TORQ VERSACORE-J 145CM (WIRE) ×1 IMPLANT
WIRE SAFE-T 1.5MM-J .035X260CM (WIRE) ×1 IMPLANT

## 2015-09-18 NOTE — H&P (View-Only) (Signed)
Chief Complaint  Patient presents with  . Chest Pain    History of Present Illness: 75 yo WF with history of paroxysmal atrial fibrillation, SVT, PVCs, HTN, breast cancer and atypical chest pain who is here for evaluation of palpitations.  She has been followed in the past by Dr. Bea Laura. Echo June 2012 with normal LV function and no valve disease. She has been known to have paroxysmal atrial fibrillation but has only had rare occurrences over the past ten years. She feels tightness in the chest and neck, shakiness, and palpitations when she has atrial fibrillation. She was started on Xarelto in 2015. Event monitor August 2014 with several short runs of SVT but no evidence of atrial fibrillation. She was seen in the ED 11/22/14 with palpitations, nausea and SOB and was found to be in atrial fibrillation with RVR. She converted to sinus in the ED spontaneously. She has been following in primary care for her thyroid disease and her Synthroid dose is being adjusted. She has reported more frequent episodes of palpitations and weakness over the past few months. Digoxin dose reduced in primary care due to elevated levels. 48 hour monitor with PVCs, PACs, and several short runs of SVT.   She is here for follow up. She feels well in the am. By mid morning, she begins to feel chest pressure, dyspnea and this is followed by weakness. She has no energy. No near syncope or syncope. She has worsening of chest tightness during the day with exertion. This resolves with rest.   Primary Care Physician: Jerlyn Ly, MD   Past Medical History:  Diagnosis Date  . Arrhythmia    HISTORY OF PAROXYSMAL AFIB   . Atypical chest pain   . Breast cancer ([place])    H/O LEFT MASTECTOMY IN 2000  . H/O: hysterectomy   . Hypertension   . Melanoma ([place]) 1991   REMOVED FROM LEFT LEG IN 1991  . Thyroid cancer [facility])    s/p THYROIDECTOMY    Past Surgical History:  Procedure Laterality Date  .  tarsal tunnel release   2002   Right/Left  tarsal tunnel release  . CARDIAC CATHETERIZATION  04/21/07   GLOBAL ESTIMATED EF IS 60 %; NORMAL LV FUNCTION; NORMAL CORONARY ARTERIES; BORDERLINE INCREASE IN THE AORTIC ROOT  . CYSTOSCOPY  2007   anterior repair of cystocele with mesh  . MASTECTOMY  2000   LEFT BREAST  . MELANOMA EXCISION  1991   REMOVED FROM LEFT LEG  . THYROIDECTOMY      Current Outpatient Prescriptions  Medication Sig Dispense Refill  . BYSTOLIC 5 MG tablet TAKE 1/2 TABLET BY MOUTH DAILY 30 tablet 1  . fish oil-omega-3 fatty acids 1000 MG capsule Take 1 g by mouth daily.     . fluticasone (FLONASE) 50 MCG/ACT nasal spray Place 2 sprays into the nose daily.    . Glucosamine 500 MG TABS Take 2 tablets by mouth daily.     Marland Kitchen ipratropium (ATROVENT) 0.03 % nasal spray Place 2 sprays into the nose every 12 (twelve) hours.    Marland Kitchen levothyroxine (SYNTHROID, LEVOTHROID) 150 MCG tablet Take 150 mcg by mouth daily before breakfast. 6 DAYS A WEEK    . omeprazole (PRILOSEC) 20 MG capsule Take 20 mg by mouth daily.    . potassium chloride SA (K-DUR,KLOR-CON) 20 MEQ tablet Take 40 mEq by mouth daily.     . simvastatin (ZOCOR) 40 MG tablet Take 40 mg by mouth at bedtime.      Marland Kitchen  triamterene-hydrochlorothiazide (MAXZIDE) 75-50 MG per tablet Take 1 tablet by mouth daily.      Alveda Reasons 20 MG TABS tablet TAKE ONE TABLET BY MOUTH EACH EVENING WITH DINNER (Patient taking differently: TAKE 1/2 TABLET BY MOUTH EACH EVENING WITH DINNER) 30 tablet 9  . diltiazem (CARDIZEM CD) 120 MG 24 hr capsule Take 1 capsule (120 mg total) by mouth daily. 90 capsule 3   No current facility-administered medications for this visit.     Allergies  Allergen Reactions  . Daypro [Oxaprozin] Hives and Other (See Comments)    Unknown  . Prednisone Other (See Comments) and Itching    leg swelling /dyuria Unknown leg swelling /dyuria  . Tegretol [Carbamazepine] Hives and Other (See Comments)    Unknown    Social History   Social History   . Marital status: Married    Spouse name: N/A  . Number of children: N/A  . Years of education: N/A   Occupational History  . [employer]   Social History Main Topics  . Smoking status: Never Smoker  . Smokeless tobacco: Never Used  . Alcohol use Yes     Comment: RARE  . Drug use: No  . Sexual activity: Not on file   Other Topics Concern  . Not on file   Social History Narrative   RETIRED FROM [facility]   LIVES @ HOME WITH HER HUSBAND   SHE HAS CO-AUTHORED BOOKS ON IV ADDITIVES   ETOH RARELY   NON-SMOKER          Family History  Problem Relation Age of Onset  . Hypertension Father   . Macular degeneration Father     Review of Systems:  As stated in the HPI and otherwise negative.   BP 132/74 (BP Location: Right Arm, Patient Position: Sitting, Cuff Size: Normal)   Pulse 73   Ht 5\' 9"  (1.753 m)   Wt 160 lb 1.9 oz (72.6 kg)   SpO2 97%   BMI 23.65 kg/m   Physical Examination: General: Well developed, well nourished, NAD  HEENT: OP clear, mucus membranes moist  SKIN: warm, dry. No rashes. Neuro: No focal deficits  Musculoskeletal: Muscle strength 5/5 all ext  Psychiatric: Mood and affect normal  Neck: No JVD, no carotid bruits, no thyromegaly, no lymphadenopathy.  Lungs:Clear bilaterally, no wheezes, rhonci, crackles Cardiovascular: Regular rate and rhythm. No murmurs, gallops or rubs. Abdomen:Soft. Bowel sounds present. Non-tender.  Extremities: No lower extremity edema. Pulses are 2 + in the bilateral DP/PT  EKG:  EKG is not ordered today. The ekg ordered today demonstrates  Recent Labs: 11/22/2014: BUN 19; Creatinine, Ser 0.96; Hemoglobin 14.9; Platelets 209; Potassium 3.6; Sodium 144   Lipid Panel No results found for: CHOL, TRIG, HDL, CHOLHDL, VLDL, LDLCALC, LDLDIRECT   Wt Readings from Last 3 Encounters:  09/16/15 160 lb 1.9 oz (72.6 kg)  04/15/15 160 lb 12.8 oz (72.9 kg)  12/09/14 160 lb (72.6 kg)      Other studies Reviewed: Additional studies/ records that were reviewed today include: . Review of the above records demonstrates:    Assessment and Plan:   1. Atrial fibrillation, paroxysmal/PVCs/SVT: No episodes of atrial fib documented on monitor. She is having PVCs and short runs of SVT. She is on Bystolic and now reduced dose Digoxin. She is anti-coagulated with Xarelto. I will stop her Digoxin and will add Cardizem CD 120 mg daily.  If we cannot control her symptoms with  PVCs on medical therapy, consider referral to EP.    2. Chest pain/Unstable angina: Will arrange cardiac cath this week. Risks and benefits reviewed. Pre-cath labs today. Hold Xarelto today and hold until after cath.    3. Dyspnea: Echo to exclude valve disease, assess LVEF  Current medicines are reviewed at length with the patient today.  The patient does not have concerns regarding medicines.  The following changes have been made:  no change  Labs/ tests ordered today include:   Orders Placed This Encounter  Procedures  . Basic metabolic panel  . CBC w/Diff  . INR/PT  . ECHOCARDIOGRAM COMPLETE    Disposition:   FU with me in 3 weeks.   Signed, Lauree Chandler, MD 09/16/2015 8:54 AM    [facility] [place] [address], [place], [place]  [address] Phone: [phone]; Fax: [phone]

## 2015-09-18 NOTE — Discharge Instructions (Signed)
Radial Site Care °Refer to this sheet in the next few weeks. These instructions provide you with information about caring for yourself after your procedure. Your health care provider may also give you more specific instructions. Your treatment has been planned according to current medical practices, but problems sometimes occur. Call your health care provider if you have any problems or questions after your procedure. °WHAT TO EXPECT AFTER THE PROCEDURE °After your procedure, it is typical to have the following: °· Bruising at the radial site that usually fades within 1-2 weeks. °· Blood collecting in the tissue (hematoma) that may be painful to the touch. It should usually decrease in size and tenderness within 1-2 weeks. °HOME CARE INSTRUCTIONS °· Take medicines only as directed by your health care provider. °· You may shower 24-48 hours after the procedure or as directed by your health care provider. Remove the bandage (dressing) and gently wash the site with plain soap and water. Pat the area dry with a clean towel. Do not rub the site, because this may cause bleeding. °· Do not take baths, swim, or use a hot tub until your health care provider approves. °· Check your insertion site every day for redness, swelling, or drainage. °· Do not apply powder or lotion to the site. °· Do not flex or bend the affected arm for 24 hours or as directed by your health care provider. °· Do not push or pull heavy objects with the affected arm for 24 hours or as directed by your health care provider. °· Do not lift over 10 lb (4.5 kg) for 5 days after your procedure or as directed by your health care provider. °· Ask your health care provider when it is okay to: °¨ Return to work or school. °¨ Resume usual physical activities or sports. °¨ Resume sexual activity. °· Do not drive home if you are discharged the same day as the procedure. Have someone else drive you. °· You may drive 24 hours after the procedure unless otherwise  instructed by your health care provider. °· Do not operate machinery or power tools for 24 hours after the procedure. °· If your procedure was done as an outpatient procedure, which means that you went home the same day as your procedure, a responsible adult should be with you for the first 24 hours after you arrive home. °· Keep all follow-up visits as directed by your health care provider. This is important. °SEEK MEDICAL CARE IF: °· You have a fever. °· You have chills. °· You have increased bleeding from the radial site. Hold pressure on the site. CALL 911 °SEEK IMMEDIATE MEDICAL CARE IF: °· You have unusual pain at the radial site. °· You have redness, warmth, or swelling at the radial site. °· You have drainage (other than a small amount of blood on the dressing) from the radial site. °· The radial site is bleeding, and the bleeding does not stop after 30 minutes of holding steady pressure on the site. °· Your arm or hand becomes pale, cool, tingly, or numb. °  °This information is not intended to replace advice given to you by your health care provider. Make sure you discuss any questions you have with your health care provider. °  °Document Released: 03/13/2010 Document Revised: 03/01/2014 Document Reviewed: 08/27/2013 °Elsevier Interactive Patient Education ©2016 Elsevier Inc. ° °

## 2015-09-18 NOTE — Research (Signed)
[facility] Informed Consent   Subject Name: Christina [name]  Subject met inclusion and exclusion criteria.  The informed consent form, study requirements and expectations were reviewed with the subject and questions and concerns were addressed prior to the signing of the consent form.  The subject verbalized understanding of the trail requirements.  The subject agreed to participate in the LEAD[employer] FREE trial and signed the informed consent.  The informed consent was obtained prior to performance of any protocol-specific procedures for the subject.  A copy of the signed informed consent was given to the subject and a copy was placed in the subject's medical record.  Sandie Ano 09/18/2015, 8:00

## 2015-09-18 NOTE — Interval H&P Note (Signed)
History and Physical Interval Note:  09/18/2015 8:19 AM  Christina [name]  has presented today for cardiac cath with the diagnosis of unstable angina  The various methods of treatment have been discussed with the patient and family. After consideration of risks, benefits and other options for treatment, the patient has consented to  Procedure(s): Left Heart Cath and Coronary Angiography (N/A) as a surgical intervention .  The patient's history has been reviewed, patient examined, no change in status, stable for surgery.  I have reviewed the patient's chart and labs.  Questions were answered to the patient's satisfaction.    Cath Lab Visit (complete for each Cath Lab visit)  Clinical Evaluation Leading to the Procedure:   ACS: No.  Non-ACS:    Anginal Classification: CCS II  Anti-ischemic medical therapy: Minimal Therapy (1 class of medications)  Non-Invasive Test Results: No non-invasive testing performed  Prior CABG: No previous CABG         Lauree Chandler

## 2015-10-06 ENCOUNTER — Telehealth: Payer: Self-pay | Admitting: Cardiovascular Disease

## 2015-10-06 NOTE — Telephone Encounter (Signed)
New message    Pt called stated that she has a diagnostic procedure scheduled for Oct 08, 2015 and has misplaced her instructions. Please call.

## 2015-10-06 NOTE — Telephone Encounter (Signed)
Called patient about her echocardiogram appointment on Thursday. Confirmed time and location of test for patient. Patient verbalized understanding.

## 2015-10-09 ENCOUNTER — Telehealth: Payer: Self-pay | Admitting: *Deleted

## 2015-10-09 ENCOUNTER — Ambulatory Visit (INDEPENDENT_AMBULATORY_CARE_PROVIDER_SITE_OTHER): Payer: Medicare Other | Admitting: Nurse Practitioner

## 2015-10-09 ENCOUNTER — Ambulatory Visit (HOSPITAL_COMMUNITY): Payer: Medicare Other | Attending: Cardiovascular Disease

## 2015-10-09 ENCOUNTER — Other Ambulatory Visit: Payer: Self-pay

## 2015-10-09 ENCOUNTER — Encounter: Payer: Self-pay | Admitting: Nurse Practitioner

## 2015-10-09 VITALS — BP 110/80 | HR 62 | Ht 69.5 in | Wt 160.8 lb

## 2015-10-09 DIAGNOSIS — E785 Hyperlipidemia, unspecified: Secondary | ICD-10-CM

## 2015-10-09 DIAGNOSIS — I480 Paroxysmal atrial fibrillation: Secondary | ICD-10-CM

## 2015-10-09 DIAGNOSIS — I10 Essential (primary) hypertension: Secondary | ICD-10-CM | POA: Insufficient documentation

## 2015-10-09 DIAGNOSIS — R002 Palpitations: Secondary | ICD-10-CM

## 2015-10-09 DIAGNOSIS — R0600 Dyspnea, unspecified: Secondary | ICD-10-CM

## 2015-10-09 DIAGNOSIS — I493 Ventricular premature depolarization: Secondary | ICD-10-CM

## 2015-10-09 DIAGNOSIS — I4891 Unspecified atrial fibrillation: Secondary | ICD-10-CM | POA: Insufficient documentation

## 2015-10-09 DIAGNOSIS — I200 Unstable angina: Secondary | ICD-10-CM

## 2015-10-09 DIAGNOSIS — R0789 Other chest pain: Secondary | ICD-10-CM

## 2015-10-09 LAB — ECHOCARDIOGRAM COMPLETE
Height: 69.5 in
Weight: 2572.8 oz

## 2015-10-09 NOTE — Progress Notes (Signed)
Office Visit    Patient Name: Christina [name] Date of Encounter: 10/09/2015  Primary Care Provider:  Jerlyn Ly, MD Primary Cardiologist:  C. Angelena Form, MD   Chief Complaint    75 year old female with a history of palpitations, paroxysmal atrial fibrillation, symptomatic PVCs, and recent episodes of chest pain status post nonobstructive catheterization, who presents for follow-up.  Past Medical History    Past Medical History:  Diagnosis Date  . Atypical chest pain    a. 08/2015 Cath: LM nl, LAD 20ost, D1 small/nl, D2 mod, nl, RI small, nl, LCX nl, OM1 small, nl, OM2 mod nl, RCA 66m, EF 55-65%.  . Breast cancer ([place])    H/O LEFT MASTECTOMY IN 2000  . H/O: hysterectomy   . Hypertension   . Melanoma ([place]) 1991   REMOVED FROM LEFT LEG IN 1991  . PAF (paroxysmal atrial fibrillation) ([place])    a. CHA2DS2VASc = 3-->Xarelto since 2015;  b. 08/2015 48h holter in setting of palpitations-->PVC's.  . Palpitations    a. 08/2015 48h Holter: pvc's.  Marland Kitchen PVCs (premature ventricular contractions)   . Thyroid cancer [facility])    s/p THYROIDECTOMY   Past Surgical History:  Procedure Laterality Date  .  tarsal tunnel release  2002   Right/Left  tarsal tunnel release  . CARDIAC CATHETERIZATION  04/21/07   GLOBAL ESTIMATED EF IS 60 %; NORMAL LV FUNCTION; NORMAL CORONARY ARTERIES; BORDERLINE INCREASE IN THE AORTIC ROOT  . CARDIAC CATHETERIZATION N/A 09/18/2015   Procedure: Left Heart Cath and Coronary Angiography;  Surgeon: Burnell Blanks, MD;  Location: [facility];  Service: Cardiovascular;  Laterality: N/A;  . CYSTOSCOPY  2007   anterior repair of cystocele with mesh  . MASTECTOMY  2000   LEFT BREAST  . MELANOMA EXCISION  1991   REMOVED FROM LEFT LEG  . THYROIDECTOMY      Allergies  Allergies  Allergen Reactions  . Daypro [Oxaprozin] Hives  . Prednisone Itching and Other (See Comments)    leg swelling /dyuria   . Tegretol [Carbamazepine] Hives    History of Present  Illness    75 year old female with a prior history of paroxysmal atrial fibrillation on chronic Xarelto anticoagulation since 2015. She also has a history of hypertension, breast cancer, and palpitations with documented PVCs, PACs, and short runs of SVT on prior 48 hour Holter monitor. She recently was experiencing increased palpitations and PVCs were noted on monitoring. Digoxin was discontinued and she was placed on diltiazem. She was seen in clinic and also complained of intermittent chest discomfort and weakness in the mornings. She underwent diagnostic catheterization which revealed minimal nonobstructive CAD with normal LV function. She is also scheduled for echocardiogram. Since her catheterization, she is done recently well. Following initiation of diltiazem, she feels as though her burden PVCs has reduced some. She still has the occasional PVC, maybe about 4 or 5 times per day, but this does not limit her activity. She says she is pretty much moving all day without significant limitations. She continues to have occasional heaviness in her chest, but this does not stop her from doing what she is doing. She denies dyspnea, PND, orthopnea, dizziness, syncope, edema, or early satiety.  Home Medications    Prior to Admission medications   Medication Sig Start Date End Date Taking? Authorizing Provider  acetaminophen (TYLENOL) 500 MG tablet Take 500-1,000 mg by mouth daily as needed for mild pain or headache.   Yes Historical Provider, MD  diltiazem (  CARDIZEM CD) 120 MG 24 hr capsule Take 1 capsule (120 mg total) by mouth daily. 09/16/15 12/15/15 Yes Burnell Blanks, MD  fish oil-omega-3 fatty acids 1000 MG capsule Take 2 g by mouth daily.    Yes Historical Provider, MD  fluticasone (FLONASE) 50 MCG/ACT nasal spray Place 2 sprays into the nose daily.   Yes Historical Provider, MD  Glucosamine 500 MG TABS Take 1,000 mg by mouth daily.    Yes Historical Provider, MD  ipratropium (ATROVENT) 0.03 %  nasal spray Place 2 sprays into the nose 2 (two) times daily as needed for rhinitis.    Yes Historical Provider, MD  levothyroxine (SYNTHROID, LEVOTHROID) 150 MCG tablet Take 150 mcg by mouth daily before breakfast. 150 mcg Mon - Sat 37.5 mcg on Sunday   Yes Historical Provider, MD  nebivolol (BYSTOLIC) 5 MG tablet TAKE 1/2 TABLET BY MOUTH DAILY (2.5 MG TOTAL)   Yes Historical Provider, MD  omeprazole (PRILOSEC) 20 MG capsule Take 20 mg by mouth daily.   Yes Historical Provider, MD  potassium chloride SA (K-DUR,KLOR-CON) 20 MEQ tablet Take 40 mEq by mouth daily.    Yes Historical Provider, MD  rivaroxaban (XARELTO) 20 MG TABS tablet Take 20 mg by mouth daily with supper.   Yes Historical Provider, MD  simvastatin (ZOCOR) 40 MG tablet Take 40 mg by mouth at bedtime.     Yes Historical Provider, MD  triamterene-hydrochlorothiazide (MAXZIDE) 75-50 MG per tablet TAKE 1/2 TABLET BY MOUTH DAILY   Yes Historical Provider, MD    Review of Systems    As above, she still has some degree of PVCs but notes that the overall burden has reduced some since being placed on diltiazem. Since her catheterization, she has noted occasional chest heaviness, but says that this is not severe and does not limit her activities. She denies dyspnea, PND, orthopnea, dizziness, syncope, edema, or early satiety.  All other systems reviewed and are otherwise negative except as noted above.  Physical Exam    VS:  BP 110/80   Pulse 62   Ht 5' 9.5" (1.765 m)   Wt 160 lb 12.8 oz (72.9 kg)   BMI 23.41 kg/m  , BMI Body mass index is 23.41 kg/m. GEN: Well nourished, well developed, in no acute distress.  HEENT: normal.  Neck: Supple, no JVD, carotid bruits, or masses. Cardiac: RRR, no murmurs, rubs, or gallops. No clubbing, cyanosis, edema.  Radials/DP/PT 2+ and equal bilaterally. Right radial cath site is without bleeding, bruit, or hematoma. Respiratory:  Respirations regular and unlabored, clear to auscultation  bilaterally. GI: Soft, nontender, nondistended, BS + x 4. MS: no deformity or atrophy. Skin: warm and dry, no rash. Neuro:  Strength and sensation are intact. Psych: Normal affect.  Accessory Clinical Findings    Catheterization reviewed in detail with patient.  Assessment & Plan    1.  Atypical chest pain: Status post recent diagnostic catheterization in late July, revealing minimal nonobstructive CAD. She continues to have occasional chest pressure, however this does not limit activities. Echo was performed this morning, and read is pending. She had normal LV function on cath. She remains on beta blocker and statin therapy. She is not on aspirin secondary to requirement for daily Xarelto.  2. PVCs/PACs/palpitations: Burden of palpitations has improved some since being placed on diltiazem. She is also treated with by systolic. She feels as though her current burden of palpitations is something that she can live with and they do not limit her  activity. No changes to doses of medications today. She realizes that if burden of palpitations increases, she would likely require referral to EP for further evaluation.  3. Paroxysmal atrial fibrillation: No recent episodes. She remains on beta blocker, calcium channel blocker, and Xarelto.  4. Essential hypertension: Blood pressure stable on beta blocker, calcium channel blocker, and diuretic.  5. Hyperlipidemia: She remains on statin therapy. Lipids are followed by primary care.  6. Disposition: Follow-up with Dr. Angelena Form in 6 months or sooner if necessary.   Murray Hodgkins, NP 10/09/2015, 9:11 AM

## 2015-10-09 NOTE — Telephone Encounter (Signed)
Pt notified of echo results by phone with verbal understanding 

## 2015-10-09 NOTE — Patient Instructions (Signed)
Medication Instructions:  Your physician recommends that you continue on your current medications as directed. Please refer to the Current Medication list given to you today.  Labwork: No new orders.   Testing/Procedures: No new orders.   Follow-Up: Your physician wants you to follow-up in: 6 MONTHS with Dr Angelena Form.  You will receive a reminder letter in the mail two months in advance. If you don't receive a letter, please call our office to schedule the follow-up appointment.   Any Other Special Instructions Will Be Listed Below (If Applicable).     If you need a refill on your cardiac medications before your next appointment, please call your pharmacy.

## 2015-10-13 DIAGNOSIS — H25813 Combined forms of age-related cataract, bilateral: Secondary | ICD-10-CM | POA: Diagnosis not present

## 2015-10-13 DIAGNOSIS — H353132 Nonexudative age-related macular degeneration, bilateral, intermediate dry stage: Secondary | ICD-10-CM | POA: Diagnosis not present

## 2015-10-13 DIAGNOSIS — H43813 Vitreous degeneration, bilateral: Secondary | ICD-10-CM | POA: Diagnosis not present

## 2015-11-06 DIAGNOSIS — Z6823 Body mass index (BMI) 23.0-23.9, adult: Secondary | ICD-10-CM | POA: Diagnosis not present

## 2015-11-06 DIAGNOSIS — M899 Disorder of bone, unspecified: Secondary | ICD-10-CM | POA: Insufficient documentation

## 2015-11-06 DIAGNOSIS — Z23 Encounter for immunization: Secondary | ICD-10-CM | POA: Diagnosis not present

## 2015-11-06 DIAGNOSIS — I10 Essential (primary) hypertension: Secondary | ICD-10-CM | POA: Diagnosis not present

## 2015-11-06 DIAGNOSIS — S90219A Contusion of unspecified great toe with damage to nail, initial encounter: Secondary | ICD-10-CM | POA: Diagnosis not present

## 2015-11-06 DIAGNOSIS — M859 Disorder of bone density and structure, unspecified: Secondary | ICD-10-CM | POA: Diagnosis not present

## 2015-11-06 DIAGNOSIS — K921 Melena: Secondary | ICD-10-CM | POA: Diagnosis not present

## 2015-11-06 DIAGNOSIS — I480 Paroxysmal atrial fibrillation: Secondary | ICD-10-CM | POA: Diagnosis not present

## 2015-11-06 DIAGNOSIS — J302 Other seasonal allergic rhinitis: Secondary | ICD-10-CM | POA: Diagnosis not present

## 2015-11-07 DIAGNOSIS — H353131 Nonexudative age-related macular degeneration, bilateral, early dry stage: Secondary | ICD-10-CM | POA: Diagnosis not present

## 2015-11-07 DIAGNOSIS — D3131 Benign neoplasm of right choroid: Secondary | ICD-10-CM | POA: Diagnosis not present

## 2015-11-07 DIAGNOSIS — H25813 Combined forms of age-related cataract, bilateral: Secondary | ICD-10-CM | POA: Diagnosis not present

## 2015-11-07 DIAGNOSIS — H43813 Vitreous degeneration, bilateral: Secondary | ICD-10-CM | POA: Diagnosis not present

## 2015-11-11 DIAGNOSIS — M47812 Spondylosis without myelopathy or radiculopathy, cervical region: Secondary | ICD-10-CM | POA: Diagnosis not present

## 2015-11-25 DIAGNOSIS — H52201 Unspecified astigmatism, right eye: Secondary | ICD-10-CM | POA: Diagnosis not present

## 2015-11-25 DIAGNOSIS — H21561 Pupillary abnormality, right eye: Secondary | ICD-10-CM | POA: Diagnosis not present

## 2015-11-25 DIAGNOSIS — H268 Other specified cataract: Secondary | ICD-10-CM | POA: Diagnosis not present

## 2015-11-25 DIAGNOSIS — H25811 Combined forms of age-related cataract, right eye: Secondary | ICD-10-CM | POA: Diagnosis not present

## 2015-12-16 DIAGNOSIS — H25812 Combined forms of age-related cataract, left eye: Secondary | ICD-10-CM | POA: Diagnosis not present

## 2015-12-16 DIAGNOSIS — H268 Other specified cataract: Secondary | ICD-10-CM | POA: Diagnosis not present

## 2015-12-16 DIAGNOSIS — H52202 Unspecified astigmatism, left eye: Secondary | ICD-10-CM | POA: Diagnosis not present

## 2015-12-16 DIAGNOSIS — H21562 Pupillary abnormality, left eye: Secondary | ICD-10-CM | POA: Diagnosis not present

## 2016-01-08 DIAGNOSIS — D2271 Melanocytic nevi of right lower limb, including hip: Secondary | ICD-10-CM | POA: Diagnosis not present

## 2016-01-08 DIAGNOSIS — I10 Essential (primary) hypertension: Secondary | ICD-10-CM | POA: Diagnosis not present

## 2016-01-08 DIAGNOSIS — L821 Other seborrheic keratosis: Secondary | ICD-10-CM | POA: Diagnosis not present

## 2016-01-08 DIAGNOSIS — D2272 Melanocytic nevi of left lower limb, including hip: Secondary | ICD-10-CM | POA: Diagnosis not present

## 2016-01-08 DIAGNOSIS — D224 Melanocytic nevi of scalp and neck: Secondary | ICD-10-CM | POA: Diagnosis not present

## 2016-01-08 DIAGNOSIS — D2261 Melanocytic nevi of right upper limb, including shoulder: Secondary | ICD-10-CM | POA: Diagnosis not present

## 2016-01-08 DIAGNOSIS — E038 Other specified hypothyroidism: Secondary | ICD-10-CM | POA: Diagnosis not present

## 2016-01-08 DIAGNOSIS — D1801 Hemangioma of skin and subcutaneous tissue: Secondary | ICD-10-CM | POA: Diagnosis not present

## 2016-01-08 DIAGNOSIS — D225 Melanocytic nevi of trunk: Secondary | ICD-10-CM | POA: Diagnosis not present

## 2016-01-08 DIAGNOSIS — D2262 Melanocytic nevi of left upper limb, including shoulder: Secondary | ICD-10-CM | POA: Diagnosis not present

## 2016-01-08 DIAGNOSIS — Z85820 Personal history of malignant melanoma of skin: Secondary | ICD-10-CM | POA: Diagnosis not present

## 2016-01-08 DIAGNOSIS — Z85828 Personal history of other malignant neoplasm of skin: Secondary | ICD-10-CM | POA: Diagnosis not present

## 2016-01-28 DIAGNOSIS — H353210 Exudative age-related macular degeneration, right eye, stage unspecified: Secondary | ICD-10-CM | POA: Diagnosis not present

## 2016-01-28 DIAGNOSIS — H353122 Nonexudative age-related macular degeneration, left eye, intermediate dry stage: Secondary | ICD-10-CM | POA: Diagnosis not present

## 2016-01-29 DIAGNOSIS — H353211 Exudative age-related macular degeneration, right eye, with active choroidal neovascularization: Secondary | ICD-10-CM | POA: Diagnosis not present

## 2016-01-29 DIAGNOSIS — H353121 Nonexudative age-related macular degeneration, left eye, early dry stage: Secondary | ICD-10-CM | POA: Diagnosis not present

## 2016-02-05 DIAGNOSIS — H9202 Otalgia, left ear: Secondary | ICD-10-CM | POA: Diagnosis not present

## 2016-02-05 DIAGNOSIS — H6993 Unspecified Eustachian tube disorder, bilateral: Secondary | ICD-10-CM | POA: Insufficient documentation

## 2016-02-05 DIAGNOSIS — H9192 Unspecified hearing loss, left ear: Secondary | ICD-10-CM | POA: Diagnosis not present

## 2016-02-05 DIAGNOSIS — H6983 Other specified disorders of Eustachian tube, bilateral: Secondary | ICD-10-CM | POA: Diagnosis not present

## 2016-02-05 DIAGNOSIS — H9222 Otorrhagia, left ear: Secondary | ICD-10-CM | POA: Insufficient documentation

## 2016-02-05 DIAGNOSIS — H9212 Otorrhea, left ear: Secondary | ICD-10-CM | POA: Diagnosis not present

## 2016-03-16 DIAGNOSIS — H353211 Exudative age-related macular degeneration, right eye, with active choroidal neovascularization: Secondary | ICD-10-CM | POA: Diagnosis not present

## 2016-03-19 DIAGNOSIS — J019 Acute sinusitis, unspecified: Secondary | ICD-10-CM | POA: Diagnosis not present

## 2016-03-19 DIAGNOSIS — R05 Cough: Secondary | ICD-10-CM | POA: Diagnosis not present

## 2016-03-23 ENCOUNTER — Other Ambulatory Visit: Payer: Self-pay | Admitting: Cardiovascular Disease

## 2016-03-23 NOTE — Telephone Encounter (Signed)
Age 76 Wt 72.9kg SrCr 0.99 (09/16/2015) Hgb 14.5(09/16/2015) Hct 44.4 (09/16/2015) CrCl 57.21 Xarelto 20 mg daily refill done

## 2016-03-24 DIAGNOSIS — H6983 Other specified disorders of Eustachian tube, bilateral: Secondary | ICD-10-CM | POA: Diagnosis not present

## 2016-03-24 DIAGNOSIS — H9222 Otorrhagia, left ear: Secondary | ICD-10-CM | POA: Diagnosis not present

## 2016-04-09 ENCOUNTER — Encounter: Payer: Self-pay | Admitting: Cardiovascular Disease

## 2016-04-09 ENCOUNTER — Ambulatory Visit (INDEPENDENT_AMBULATORY_CARE_PROVIDER_SITE_OTHER): Payer: Medicare Other | Admitting: Cardiovascular Disease

## 2016-04-09 VITALS — BP 124/80 | HR 59 | Ht 69.0 in | Wt 162.8 lb

## 2016-04-09 DIAGNOSIS — I2510 Atherosclerotic heart disease of native coronary artery without angina pectoris: Secondary | ICD-10-CM | POA: Diagnosis not present

## 2016-04-09 DIAGNOSIS — I480 Paroxysmal atrial fibrillation: Secondary | ICD-10-CM

## 2016-04-09 NOTE — Patient Instructions (Signed)

## 2016-04-09 NOTE — Progress Notes (Signed)
Chief Complaint  Patient presents with  . Shortness of Breath    History of Present Illness: 76 yo WF with history of paroxysmal atrial fibrillation, SVT, PVCs, HTN, breast cancer and mild CAD with atypical chest pain who is here for follow up. Echo June 2012 with normal LV function and no valve disease. She has been known to have paroxysmal atrial fibrillation but has only had rare occurrences over the past ten years. She feels tightness in the chest and neck, shakiness, and palpitations when she has atrial fibrillation. She was started on Xarelto in 2015. Event monitor August 2014 with several short runs of SVT but no evidence of atrial fibrillation. She was seen in the ED 11/22/14 with palpitations, nausea and SOB and was found to be in atrial fibrillation with RVR. She converted to sinus in the ED spontaneously. She reported more frequent episodes of palpitations in July 2017 with associated weakness and chest pain. Digoxin dose reduced in primary care due to elevated levels. 48 hour monitor July 2017 with PVCs, PACs, and several short runs of SVT. Cardiac cath 09/18/15 with mild CAD (10% mid RCA stenosis, 20% proximal LAD stenosis). Normal LV systolic function. Echo August 2017 with normal LV size and function, no valve disease. Cardizem was added and she has tolerated.   She is here for follow up. She feels well today. No chest pain. Rare palpitations. Recent viral syndrome. No bleeding issues on Xarelto.   Primary Care Physician: Jerlyn Ly, MD   Past Medical History:  Diagnosis Date  . Atypical chest pain    a. 08/2015 Cath: LM nl, LAD 20ost, D1 small/nl, D2 mod, nl, RI small, nl, LCX nl, OM1 small, nl, OM2 mod nl, RCA 39m, EF 55-65%.  . Breast cancer ([place])    H/O LEFT MASTECTOMY IN 2000  . H/O: hysterectomy   . Hypertension   . Melanoma ([place]) 1991   REMOVED FROM LEFT LEG IN 1991  . PAF (paroxysmal atrial fibrillation) ([place])    a. CHA2DS2VASc = 3-->Xarelto since 2015;  b. 08/2015  48h holter in setting of palpitations-->PVC's.  . Palpitations    a. 08/2015 48h Holter: pvc's.  Marland Kitchen PVCs (premature ventricular contractions)   . Thyroid cancer [facility])    s/p THYROIDECTOMY    Past Surgical History:  Procedure Laterality Date  .  tarsal tunnel release  2002   Right/Left  tarsal tunnel release  . CARDIAC CATHETERIZATION  04/21/07   GLOBAL ESTIMATED EF IS 60 %; NORMAL LV FUNCTION; NORMAL CORONARY ARTERIES; BORDERLINE INCREASE IN THE AORTIC ROOT  . CARDIAC CATHETERIZATION N/A 09/18/2015   Procedure: Left Heart Cath and Coronary Angiography;  Surgeon: Burnell Blanks, MD;  Location: [facility];  Service: Cardiovascular;  Laterality: N/A;  . CYSTOSCOPY  2007   anterior repair of cystocele with mesh  . MASTECTOMY  2000   LEFT BREAST  . MELANOMA EXCISION  1991   REMOVED FROM LEFT LEG  . THYROIDECTOMY      Current Outpatient Prescriptions  Medication Sig Dispense Refill  . acetaminophen (TYLENOL) 500 MG tablet Take 500-1,000 mg by mouth daily as needed for mild pain or headache.    . diltiazem (CARDIZEM) 120 MG tablet Take 120 mg by mouth daily.    . fish oil-omega-3 fatty acids 1000 MG capsule Take 2 g by mouth daily.     . fluticasone (FLONASE) 50 MCG/ACT nasal spray Place 2 sprays into the nose daily.    . Glucosamine 500 MG TABS  Take 1,000 mg by mouth daily.     Marland Kitchen ipratropium (ATROVENT) 0.03 % nasal spray Place 2 sprays into the nose 2 (two) times daily as needed for rhinitis.     Marland Kitchen levothyroxine (SYNTHROID, LEVOTHROID) 150 MCG tablet Take 150 mcg by mouth daily before breakfast. 150 mcg Mon - Sat 37.5 mcg on Sunday    . nebivolol (BYSTOLIC) 5 MG tablet TAKE 1/2 TABLET BY MOUTH DAILY (2.5 MG TOTAL)    . omeprazole (PRILOSEC) 20 MG capsule Take 20 mg by mouth daily.    . potassium chloride SA (K-DUR,KLOR-CON) 20 MEQ tablet Take 40 mEq by mouth daily.     . rivaroxaban (XARELTO) 20 MG TABS tablet Take 20 mg by mouth daily with supper.    . simvastatin  (ZOCOR) 40 MG tablet Take 40 mg by mouth at bedtime.      . triamterene-hydrochlorothiazide (MAXZIDE) 75-50 MG per tablet TAKE 1/2 TABLET BY MOUTH DAILY     No current facility-administered medications for this visit.     Allergies  Allergen Reactions  . Daypro [Oxaprozin] Hives  . Prednisone Itching and Other (See Comments)    leg swelling /dyuria   . Tegretol [Carbamazepine] Hives    Social History   Social History  . Marital status: Married    Spouse name: N/A  . Number of children: N/A  . Years of education: N/A   Occupational History  . [employer]   Social History Main Topics  . Smoking status: Never Smoker  . Smokeless tobacco: Never Used  . Alcohol use Yes     Comment: RARE  . Drug use: No  . Sexual activity: Not on file   Other Topics Concern  . Not on file   Social History Narrative   RETIRED FROM [employer]   LIVES @ HOME WITH HER HUSBAND   SHE HAS CO-AUTHORED BOOKS ON IV ADDITIVES   ETOH RARELY   NON-SMOKER          Family History  Problem Relation Age of Onset  . Hypertension Father   . Macular degeneration Father     Review of Systems:  As stated in the HPI and otherwise negative.   BP 124/80   Pulse (!) 59   Ht 5\' 9"  (1.753 m)   Wt 162 lb 12.8 oz (73.8 kg)   BMI 24.04 kg/m   Physical Examination: General: Well developed, well nourished, NAD  HEENT: OP clear, mucus membranes moist  SKIN: warm, dry. No rashes. Neuro: No focal deficits  Musculoskeletal: Muscle strength 5/5 all ext  Psychiatric: Mood and affect normal  Neck: No JVD, no carotid bruits, no thyromegaly, no lymphadenopathy.  Lungs:Clear bilaterally, no wheezes, rhonci, crackles Cardiovascular: Regular rate and rhythm. No murmurs, gallops or rubs. Abdomen:Soft. Bowel sounds present. Non-tender.  Extremities: No lower extremity edema. Pulses are 2 + in the bilateral DP/PT  Echo August 2017: Left ventricle: The cavity size was  normal. Wall thickness was   normal. Systolic function was normal. The estimated ejection   fraction was in the range of 55% to 60%. Wall motion was normal;   there were no regional wall motion abnormalities. Left   ventricular diastolic function parameters were normal. - Atrial septum: No defect or patent foramen ovale was identified.  EKG:  EKG is not ordered today. The ekg ordered today demonstrates   Recent Labs: 09/16/2015: BUN 22; Creat 0.99; Hemoglobin 14.5; Platelets 216; Potassium 4.0; Sodium 146  Lipid Panel No results found for: CHOL, TRIG, HDL, CHOLHDL, VLDL, LDLCALC, LDLDIRECT   Wt Readings from Last 3 Encounters:  04/09/16 162 lb 12.8 oz (73.8 kg)  10/09/15 160 lb 12.8 oz (72.9 kg)  09/18/15 155 lb (70.3 kg)     Other studies Reviewed: Additional studies/ records that were reviewed today include: . Review of the above records demonstrates:    Assessment and Plan:   1. Atrial fibrillation, paroxysmal/PVCs/SVT: No recent palpitations. No episodes of atrial fib documented on monitor in July 2017. She is having PVCs and short runs of SVT. She is on Bystolic and Cardizem. She is anti-coagulated with Xarelto.   2.CAD: Mild CAD by cath July 2017. No chest pain.     Current medicines are reviewed at length with the patient today.  The patient does not have concerns regarding medicines.  The following changes have been made:  no change  Labs/ tests ordered today include:   No orders of the defined types were placed in this encounter.   Disposition:   FU with me in 6 months  Signed, Lauree Chandler, MD 04/09/2016 3:03 PM    [facility] [place], [place], [place]  [address] Phone: [phone]; Fax: [phone]

## 2016-04-20 DIAGNOSIS — H353211 Exudative age-related macular degeneration, right eye, with active choroidal neovascularization: Secondary | ICD-10-CM | POA: Diagnosis not present

## 2016-05-07 DIAGNOSIS — H6983 Other specified disorders of Eustachian tube, bilateral: Secondary | ICD-10-CM | POA: Diagnosis not present

## 2016-05-07 DIAGNOSIS — H9313 Tinnitus, bilateral: Secondary | ICD-10-CM | POA: Insufficient documentation

## 2016-05-07 DIAGNOSIS — J302 Other seasonal allergic rhinitis: Secondary | ICD-10-CM | POA: Insufficient documentation

## 2016-05-24 DIAGNOSIS — M859 Disorder of bone density and structure, unspecified: Secondary | ICD-10-CM | POA: Diagnosis not present

## 2016-05-24 DIAGNOSIS — I10 Essential (primary) hypertension: Secondary | ICD-10-CM | POA: Diagnosis not present

## 2016-05-24 DIAGNOSIS — E038 Other specified hypothyroidism: Secondary | ICD-10-CM | POA: Diagnosis not present

## 2016-05-24 DIAGNOSIS — E784 Other hyperlipidemia: Secondary | ICD-10-CM | POA: Diagnosis not present

## 2016-05-25 DIAGNOSIS — H353211 Exudative age-related macular degeneration, right eye, with active choroidal neovascularization: Secondary | ICD-10-CM | POA: Diagnosis not present

## 2016-05-31 DIAGNOSIS — R3121 Asymptomatic microscopic hematuria: Secondary | ICD-10-CM | POA: Insufficient documentation

## 2016-05-31 DIAGNOSIS — I480 Paroxysmal atrial fibrillation: Secondary | ICD-10-CM | POA: Diagnosis not present

## 2016-05-31 DIAGNOSIS — C73 Malignant neoplasm of thyroid gland: Secondary | ICD-10-CM | POA: Diagnosis not present

## 2016-05-31 DIAGNOSIS — Z0000 Encounter for general adult medical examination without abnormal findings: Secondary | ICD-10-CM | POA: Diagnosis not present

## 2016-06-04 DIAGNOSIS — Z1212 Encounter for screening for malignant neoplasm of rectum: Secondary | ICD-10-CM | POA: Diagnosis not present

## 2016-06-15 DIAGNOSIS — R195 Other fecal abnormalities: Secondary | ICD-10-CM | POA: Diagnosis not present

## 2016-06-23 DIAGNOSIS — H353211 Exudative age-related macular degeneration, right eye, with active choroidal neovascularization: Secondary | ICD-10-CM | POA: Diagnosis not present

## 2016-07-20 DIAGNOSIS — Z6823 Body mass index (BMI) 23.0-23.9, adult: Secondary | ICD-10-CM | POA: Diagnosis not present

## 2016-07-20 DIAGNOSIS — R609 Edema, unspecified: Secondary | ICD-10-CM | POA: Diagnosis not present

## 2016-07-20 DIAGNOSIS — M79675 Pain in left toe(s): Secondary | ICD-10-CM | POA: Diagnosis not present

## 2016-07-21 DIAGNOSIS — H353211 Exudative age-related macular degeneration, right eye, with active choroidal neovascularization: Secondary | ICD-10-CM | POA: Diagnosis not present

## 2016-08-09 ENCOUNTER — Other Ambulatory Visit: Payer: Self-pay | Admitting: Internal Medicine

## 2016-08-09 DIAGNOSIS — Z1231 Encounter for screening mammogram for malignant neoplasm of breast: Secondary | ICD-10-CM

## 2016-08-17 DIAGNOSIS — H353211 Exudative age-related macular degeneration, right eye, with active choroidal neovascularization: Secondary | ICD-10-CM | POA: Diagnosis not present

## 2016-09-13 ENCOUNTER — Ambulatory Visit
Admission: RE | Admit: 2016-09-13 | Discharge: 2016-09-13 | Disposition: A | Discharge status: Home / Self Care | Payer: Medicare Other | Source: Ambulatory Visit | Attending: Internal Medicine | Admitting: Internal Medicine

## 2016-09-13 DIAGNOSIS — Z1231 Encounter for screening mammogram for malignant neoplasm of breast: Secondary | ICD-10-CM

## 2016-09-14 DIAGNOSIS — H353211 Exudative age-related macular degeneration, right eye, with active choroidal neovascularization: Secondary | ICD-10-CM | POA: Diagnosis not present

## 2016-09-20 DIAGNOSIS — R198 Other specified symptoms and signs involving the digestive system and abdomen: Secondary | ICD-10-CM | POA: Diagnosis not present

## 2016-09-20 DIAGNOSIS — Z961 Presence of intraocular lens: Secondary | ICD-10-CM | POA: Diagnosis not present

## 2016-09-20 DIAGNOSIS — H5211 Myopia, right eye: Secondary | ICD-10-CM | POA: Diagnosis not present

## 2016-09-21 ENCOUNTER — Other Ambulatory Visit: Payer: Self-pay | Admitting: Cardiovascular Disease

## 2016-09-21 DIAGNOSIS — R002 Palpitations: Secondary | ICD-10-CM

## 2016-09-28 ENCOUNTER — Other Ambulatory Visit: Payer: Self-pay | Admitting: Cardiovascular Disease

## 2016-09-28 NOTE — Telephone Encounter (Signed)
Pt last seen Dr Angelena Form 03/2016. SCr 0.9. Wt 73.8Kg , Age 76 yrs old. CrCl 62.92 mL/min. Will refill Xarelto 20mg  QD.

## 2016-10-07 DIAGNOSIS — R198 Other specified symptoms and signs involving the digestive system and abdomen: Secondary | ICD-10-CM | POA: Diagnosis not present

## 2016-10-07 DIAGNOSIS — Z85820 Personal history of malignant melanoma of skin: Secondary | ICD-10-CM | POA: Diagnosis not present

## 2016-10-11 DIAGNOSIS — H353221 Exudative age-related macular degeneration, left eye, with active choroidal neovascularization: Secondary | ICD-10-CM | POA: Diagnosis not present

## 2016-10-12 DIAGNOSIS — N390 Urinary tract infection, site not specified: Secondary | ICD-10-CM | POA: Diagnosis not present

## 2016-10-12 DIAGNOSIS — R300 Dysuria: Secondary | ICD-10-CM | POA: Diagnosis not present

## 2016-10-12 DIAGNOSIS — H353221 Exudative age-related macular degeneration, left eye, with active choroidal neovascularization: Secondary | ICD-10-CM | POA: Diagnosis not present

## 2016-10-13 ENCOUNTER — Telehealth: Payer: Self-pay | Admitting: Cardiovascular Disease

## 2016-10-13 DIAGNOSIS — H353231 Exudative age-related macular degeneration, bilateral, with active choroidal neovascularization: Secondary | ICD-10-CM | POA: Diagnosis not present

## 2016-10-13 DIAGNOSIS — H43813 Vitreous degeneration, bilateral: Secondary | ICD-10-CM | POA: Diagnosis not present

## 2016-10-13 LAB — HM DIABETES EYE EXAM

## 2016-10-13 NOTE — Telephone Encounter (Signed)
New Message    Pt c/o medication issue:  1. Name of Medication: xarelto  2. How are you currently taking this medication (dosage and times per day)?  1x a day  3. Are you having a reaction (difficulty breathing--STAT)? Yes  4. What is your medication issue?  Had a major bleed in eye and her eye doctor wants her to discuss this with you

## 2016-10-13 NOTE — Telephone Encounter (Signed)
Ok to hold for now. I would have her let us know when her eye doctor thinks she could go back on it. Thanks, chris

## 2016-10-13 NOTE — Telephone Encounter (Signed)
I spoke with pt who reports she felt like something flew into her left eye on Saturday.  Began having trouble with vision.  Saw eye doctor on Monday and referred to see MD who has been doing her injections for macular degeneration. Cause of bleeding could not be determined so she was sent to see a retina specialist (Dr. Baird Cancer).  Pt saw Dr. Baird Cancer today and she has bleeding behind her eye. Cause is unknown.  Pt reports Dr. Baird Cancer asked her to check with cardiology to see if Xarelto could be stopped.  Pt does not know how long she would need to be off Xarelto.

## 2016-10-13 NOTE — Telephone Encounter (Signed)
I spoke with pt and gave her information from Dr. McAlhany 

## 2016-10-19 DIAGNOSIS — H43812 Vitreous degeneration, left eye: Secondary | ICD-10-CM | POA: Diagnosis not present

## 2016-10-19 DIAGNOSIS — H353211 Exudative age-related macular degeneration, right eye, with active choroidal neovascularization: Secondary | ICD-10-CM | POA: Diagnosis not present

## 2016-10-19 DIAGNOSIS — H353221 Exudative age-related macular degeneration, left eye, with active choroidal neovascularization: Secondary | ICD-10-CM | POA: Diagnosis not present

## 2016-11-09 DIAGNOSIS — H353221 Exudative age-related macular degeneration, left eye, with active choroidal neovascularization: Secondary | ICD-10-CM | POA: Diagnosis not present

## 2016-11-09 DIAGNOSIS — H353211 Exudative age-related macular degeneration, right eye, with active choroidal neovascularization: Secondary | ICD-10-CM | POA: Diagnosis not present

## 2016-11-10 ENCOUNTER — Telehealth: Payer: Self-pay | Admitting: Cardiovascular Disease

## 2016-11-10 NOTE — Telephone Encounter (Signed)
Left message for patient to call back  

## 2016-11-10 NOTE — Telephone Encounter (Signed)
Thanks

## 2016-11-10 NOTE — Telephone Encounter (Signed)
°  New Message   pt verbalized that she is calling for rn   Eye doctor took her off xarelto  And she wants to see if its okay with Dr.McAlhany

## 2016-11-10 NOTE — Telephone Encounter (Signed)
Patient called back. Patient wanted to let Dr. Angelena Form know that her eye doctor has given her the okay to start back on her Xarelto and she will start taking today. Will forward to Dr. Angelena Form so he is aware.

## 2016-11-15 DIAGNOSIS — R198 Other specified symptoms and signs involving the digestive system and abdomen: Secondary | ICD-10-CM | POA: Diagnosis not present

## 2016-11-17 ENCOUNTER — Encounter: Payer: Self-pay | Admitting: Cardiovascular Disease

## 2016-11-24 NOTE — Progress Notes (Signed)
Chief Complaint  Patient presents with  . Follow-up    6 month follow up    History of Present Illness: 76 yo female with history of paroxysmal atrial fibrillation, SVT, PVCs, HTN, breast cancer and mild CAD who is here for follow up. She has been known to have paroxysmal atrial fibrillation for years and was started on Xarelto in 2015. She was seen in the ED 11/22/14 with palpitations, nausea and SOB and was found to be in atrial fibrillation with RVR. She converted to sinus in the ED spontaneously. She reported more frequent episodes of palpitations in July 2017 with associated weakness and chest pain. Digoxin dose reduced in primary care due to elevated levels. 48 hour monitor July 2017 with PVCs, PACs, and several short runs of SVT. Cardiac cath 09/18/15 with mild CAD (10% mid RCA stenosis, 20% proximal LAD stenosis). Normal LV systolic function. Most recent echo August 2017 with normal LV size and function, no valve disease. Cardizem was added and she has tolerated.    She is here today for follow up. The patient denies any chest pain, dyspnea, palpitations, lower extremity edema, orthopnea, PND, dizziness, near syncope or syncope. She has had issues with her eyes. 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     Primary Care Physician: Crist Infante, MD   Past Medical History:  Diagnosis Date  . Atypical chest pain    a. 08/2015 Cath: LM nl, LAD 20ost, D1 small/nl, D2 mod, nl, RI small, nl, LCX nl, OM1 small, nl, OM2 mod nl, RCA 74m, EF 55-65%.  . Breast cancer ([place])    H/O LEFT MASTECTOMY IN 2000  . Diverticulitis   . Edema   . Esophageal reflux   . H/O: hysterectomy   . Hyperlipidemia   . Hypertension   . Hypokalemia   . Hypothyroidism   . Irregular heartbeat   . Melanoma ([place]) 1991   REMOVED FROM LEFT LEG IN 1991  . Neuropathy   . 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 PAF (paroxysmal atrial fibrillation) ([place])    a. CHA2DS2VASc =  3-->Xarelto since 2015;  b. 08/2015 48h holter in setting of palpitations-->PVC's.  . Palpitations    a. 08/2015 48h Holter: pvc's.  . 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 Post-menopausal   . PVCs (premature ventricular contractions)   . Shingles   . Thyroid cancer ([place])    s/p THYROIDECTOMY  . Whooping cough    as a child    Past Surgical History:  Procedure Laterality Date  .  tarsal tunnel release  2002   Right/Left  tarsal tunnel release  . CARDIAC CATHETERIZATION  04/21/07   GLOBAL ESTIMATED EF IS 60 %; NORMAL LV FUNCTION; NORMAL CORONARY ARTERIES; BORDERLINE INCREASE IN THE AORTIC ROOT  . CARDIAC CATHETERIZATION N/A 09/18/2015   Procedure: Left Heart Cath and Coronary Angiography;  Surgeon: Burnell Blanks, MD;  Location: [facility];  Service: Cardiovascular;  Laterality: N/A;  . COLONOSCOPY    . CYSTOSCOPY  2007   anterior repair of cystocele with mesh  . MASTECTOMY  2000   LEFT BREAST  . MELANOMA EXCISION  1991   REMOVED FROM LEFT LEG  . NASAL SINUS SURGERY    . THYROIDECTOMY      Current Outpatient Prescriptions  Medication Sig Dispense Refill  . acetaminophen (TYLENOL) 500 MG tablet Take 500-1,000 mg by mouth daily as needed for mild pain or headache.    Marland Kitchen  diltiazem (CARDIZEM CD) 120 MG 24 hr capsule TAKE ONE CAPSULE BY MOUTH DAILY 90 capsule 1  . Ezetimibe (ZETIA PO) Take 10 mg by mouth daily.    . fish oil-omega-3 fatty acids 1000 MG capsule Take 2 g by mouth daily.     . fluticasone (FLONASE) 50 MCG/ACT nasal spray Place 2 sprays into the nose daily.    . Glucosamine 500 MG TABS Take 1,000 mg by mouth daily.     Marland Kitchen ipratropium (ATROVENT) 0.03 % nasal spray Place 2 sprays into the nose 2 (two) times daily as needed for rhinitis.     Marland Kitchen levothyroxine (SYNTHROID, LEVOTHROID) 150 MCG tablet Take 150 mcg by mouth daily before breakfast. 150 mcg Mon - Sat 37.5 mcg on Sunday    . nebivolol (BYSTOLIC) 5 MG tablet TAKE 1/2 TABLET BY MOUTH DAILY (2.5 MG TOTAL)    .  omeprazole (PRILOSEC) 20 MG capsule Take 20 mg by mouth daily.    . potassium chloride SA (K-DUR,KLOR-CON) 20 MEQ tablet Take 40 mEq by mouth daily.     . rivaroxaban (XARELTO) 20 MG TABS tablet Take 20 mg by mouth daily with supper.    . simvastatin (ZOCOR) 40 MG tablet Take 40 mg by mouth at bedtime.      . triamterene-hydrochlorothiazide (MAXZIDE) 75-50 MG per tablet TAKE 1/2 TABLET BY MOUTH DAILY    . XARELTO 20 MG TABS tablet TAKE ONE TABLET BY MOUTH EACH EVENING WITH DINNER 30 tablet 6   No current facility-administered medications for this visit.     Allergies  Allergen Reactions  . Daypro [Oxaprozin] Hives  . Prednisone Itching and Other (See Comments)    leg swelling /dyuria   . Tegretol [Carbamazepine] Hives    Social History   Social History  . Marital status: Married    Spouse name: N/A  . Number of children: N/A  . Years of education: N/A   Occupational History  . [employer]   Social History Main Topics  . Smoking status: Never Smoker  . Smokeless tobacco: Never Used  . Alcohol use Yes     Comment: RARE  . Drug use: No  . Sexual activity: Not on file   Other Topics Concern  . Not on file   Social History Narrative   RETIRED FROM [facility]   LIVES @ HOME WITH HER HUSBAND   SHE HAS CO-AUTHORED BOOKS ON IV ADDITIVES   ETOH RARELY   NON-SMOKER          Family History  Problem Relation Age of Onset  . Hypertension Father   . Macular degeneration Father     Review of Systems:  As stated in the HPI and otherwise negative.   BP 110/70   Pulse (!) 59   Ht 5\' 9"  (1.753 m)   Wt 161 lb 12.8 oz (73.4 kg)   SpO2 99%   BMI 23.89 kg/m   Physical Examination:  General: Well developed, well nourished, NAD  HEENT: OP clear, mucus membranes moist  SKIN: warm, dry. No rashes. Neuro: No focal deficits  Musculoskeletal: Muscle strength 5/5 all ext  Psychiatric: Mood and affect normal  Neck: No JVD, no carotid  bruits, no thyromegaly, no lymphadenopathy.  Lungs:Clear bilaterally, no wheezes, rhonci, crackles Cardiovascular: Regular rate and rhythm. No murmurs, gallops or rubs. Abdomen:Soft. Bowel sounds present. Non-tender.  Extremities: No lower extremity edema. Pulses are 2 + in the bilateral DP/PT.  Echo August  2017: Left ventricle: The cavity size was normal. Wall thickness was   normal. Systolic function was normal. The estimated ejection   fraction was in the range of 55% to 60%. Wall motion was normal;   there were no regional wall motion abnormalities. Left   ventricular diastolic function parameters were normal. - Atrial septum: No defect or patent foramen ovale was identified.  EKG:  EKG is  ordered today. The ekg ordered today demonstrates NSR, rate 60 bpm. Poor R wave progression precordial leads, unchanged.   Recent Labs: No results found for requested labs within last 8760 hours.   Lipid Panel No results found for: CHOL, TRIG, HDL, CHOLHDL, VLDL, LDLCALC, LDLDIRECT   Wt Readings from Last 3 Encounters:  11/25/16 161 lb 12.8 oz (73.4 kg)  04/09/16 162 lb 12.8 oz (73.8 kg)  10/09/15 160 lb 12.8 oz (72.9 kg)     Other studies Reviewed: Additional studies/ records that were reviewed today include: . Review of the above records demonstrates:    Assessment and Plan:   1. Atrial fibrillation, paroxysmal/PVCs/SVT: No symptoms on current therapy with Cardizem and Bystolic. She is anticoagulated with Xarelto.   2.CAD without angina: Cath July 2017 with mild CAD. Continue beta blocker and statin.   Current medicines are reviewed at length with the patient today.  The patient does not have concerns regarding medicines.  The following changes have been made:  no change  Labs/ tests ordered today include:   No orders of the defined types were placed in this encounter.   Disposition:   FU with me in 12  months  Signed, Lauree Chandler, MD 11/25/2016 10:08 AM    [facility] [address], [place], [place]  [address] Phone: [phone]; Fax: [phone]

## 2016-11-25 ENCOUNTER — Ambulatory Visit (INDEPENDENT_AMBULATORY_CARE_PROVIDER_SITE_OTHER): Payer: Medicare Other | Admitting: Cardiovascular Disease

## 2016-11-25 ENCOUNTER — Encounter: Payer: Self-pay | Admitting: Cardiovascular Disease

## 2016-11-25 VITALS — BP 110/70 | HR 59 | Ht 69.0 in | Wt 161.8 lb

## 2016-11-25 DIAGNOSIS — I2510 Atherosclerotic heart disease of native coronary artery without angina pectoris: Secondary | ICD-10-CM

## 2016-11-25 DIAGNOSIS — I480 Paroxysmal atrial fibrillation: Secondary | ICD-10-CM

## 2016-11-25 NOTE — Patient Instructions (Signed)

## 2016-11-29 DIAGNOSIS — I10 Essential (primary) hypertension: Secondary | ICD-10-CM | POA: Diagnosis not present

## 2016-11-29 DIAGNOSIS — H3530 Unspecified macular degeneration: Secondary | ICD-10-CM | POA: Insufficient documentation

## 2016-11-29 DIAGNOSIS — I480 Paroxysmal atrial fibrillation: Secondary | ICD-10-CM | POA: Diagnosis not present

## 2016-11-29 DIAGNOSIS — E038 Other specified hypothyroidism: Secondary | ICD-10-CM | POA: Diagnosis not present

## 2016-11-29 DIAGNOSIS — K5900 Constipation, unspecified: Secondary | ICD-10-CM | POA: Insufficient documentation

## 2016-11-29 DIAGNOSIS — C50919 Malignant neoplasm of unspecified site of unspecified female breast: Secondary | ICD-10-CM | POA: Diagnosis not present

## 2016-11-29 DIAGNOSIS — R413 Other amnesia: Secondary | ICD-10-CM | POA: Diagnosis not present

## 2016-11-30 DIAGNOSIS — H353231 Exudative age-related macular degeneration, bilateral, with active choroidal neovascularization: Secondary | ICD-10-CM | POA: Diagnosis not present

## 2016-11-30 DIAGNOSIS — H353211 Exudative age-related macular degeneration, right eye, with active choroidal neovascularization: Secondary | ICD-10-CM | POA: Diagnosis not present

## 2016-11-30 DIAGNOSIS — H3562 Retinal hemorrhage, left eye: Secondary | ICD-10-CM | POA: Diagnosis not present

## 2016-11-30 DIAGNOSIS — Z961 Presence of intraocular lens: Secondary | ICD-10-CM | POA: Diagnosis not present

## 2016-11-30 DIAGNOSIS — H353221 Exudative age-related macular degeneration, left eye, with active choroidal neovascularization: Secondary | ICD-10-CM | POA: Diagnosis not present

## 2016-12-06 DIAGNOSIS — H353120 Nonexudative age-related macular degeneration, left eye, stage unspecified: Secondary | ICD-10-CM | POA: Diagnosis not present

## 2016-12-06 DIAGNOSIS — R413 Other amnesia: Secondary | ICD-10-CM | POA: Diagnosis not present

## 2016-12-06 DIAGNOSIS — F418 Other specified anxiety disorders: Secondary | ICD-10-CM | POA: Diagnosis not present

## 2016-12-06 DIAGNOSIS — Z6823 Body mass index (BMI) 23.0-23.9, adult: Secondary | ICD-10-CM | POA: Diagnosis not present

## 2016-12-06 DIAGNOSIS — F419 Anxiety disorder, unspecified: Secondary | ICD-10-CM | POA: Insufficient documentation

## 2016-12-07 ENCOUNTER — Other Ambulatory Visit: Payer: Self-pay | Admitting: Internal Medicine

## 2016-12-07 DIAGNOSIS — R413 Other amnesia: Secondary | ICD-10-CM

## 2016-12-09 DIAGNOSIS — H353211 Exudative age-related macular degeneration, right eye, with active choroidal neovascularization: Secondary | ICD-10-CM | POA: Diagnosis not present

## 2016-12-09 DIAGNOSIS — H353221 Exudative age-related macular degeneration, left eye, with active choroidal neovascularization: Secondary | ICD-10-CM | POA: Diagnosis not present

## 2016-12-17 ENCOUNTER — Ambulatory Visit
Admission: RE | Admit: 2016-12-17 | Discharge: 2016-12-17 | Disposition: A | Discharge status: Home / Self Care | Payer: Medicare Other | Source: Ambulatory Visit | Attending: Internal Medicine | Admitting: Internal Medicine

## 2016-12-17 DIAGNOSIS — R413 Other amnesia: Secondary | ICD-10-CM

## 2016-12-21 DIAGNOSIS — D329 Benign neoplasm of meninges, unspecified: Secondary | ICD-10-CM | POA: Diagnosis not present

## 2016-12-21 DIAGNOSIS — M47812 Spondylosis without myelopathy or radiculopathy, cervical region: Secondary | ICD-10-CM | POA: Diagnosis not present

## 2016-12-30 DIAGNOSIS — Z961 Presence of intraocular lens: Secondary | ICD-10-CM | POA: Diagnosis not present

## 2016-12-30 DIAGNOSIS — H353231 Exudative age-related macular degeneration, bilateral, with active choroidal neovascularization: Secondary | ICD-10-CM | POA: Diagnosis not present

## 2016-12-30 DIAGNOSIS — H353230 Exudative age-related macular degeneration, bilateral, stage unspecified: Secondary | ICD-10-CM | POA: Insufficient documentation

## 2017-01-06 DIAGNOSIS — H353221 Exudative age-related macular degeneration, left eye, with active choroidal neovascularization: Secondary | ICD-10-CM | POA: Diagnosis not present

## 2017-01-11 DIAGNOSIS — H353211 Exudative age-related macular degeneration, right eye, with active choroidal neovascularization: Secondary | ICD-10-CM | POA: Diagnosis not present

## 2017-02-03 DIAGNOSIS — H353221 Exudative age-related macular degeneration, left eye, with active choroidal neovascularization: Secondary | ICD-10-CM | POA: Diagnosis not present

## 2017-02-09 DIAGNOSIS — D2262 Melanocytic nevi of left upper limb, including shoulder: Secondary | ICD-10-CM | POA: Diagnosis not present

## 2017-02-09 DIAGNOSIS — D2261 Melanocytic nevi of right upper limb, including shoulder: Secondary | ICD-10-CM | POA: Diagnosis not present

## 2017-02-09 DIAGNOSIS — D225 Melanocytic nevi of trunk: Secondary | ICD-10-CM | POA: Diagnosis not present

## 2017-02-09 DIAGNOSIS — Z85820 Personal history of malignant melanoma of skin: Secondary | ICD-10-CM | POA: Diagnosis not present

## 2017-02-09 DIAGNOSIS — Z85828 Personal history of other malignant neoplasm of skin: Secondary | ICD-10-CM | POA: Diagnosis not present

## 2017-02-16 DIAGNOSIS — H353231 Exudative age-related macular degeneration, bilateral, with active choroidal neovascularization: Secondary | ICD-10-CM | POA: Diagnosis not present

## 2017-02-24 DIAGNOSIS — Z79899 Other long term (current) drug therapy: Secondary | ICD-10-CM | POA: Diagnosis not present

## 2017-02-24 DIAGNOSIS — L603 Nail dystrophy: Secondary | ICD-10-CM | POA: Diagnosis not present

## 2017-02-24 DIAGNOSIS — L601 Onycholysis: Secondary | ICD-10-CM | POA: Diagnosis not present

## 2017-03-01 DIAGNOSIS — H43812 Vitreous degeneration, left eye: Secondary | ICD-10-CM | POA: Diagnosis not present

## 2017-03-01 DIAGNOSIS — H353231 Exudative age-related macular degeneration, bilateral, with active choroidal neovascularization: Secondary | ICD-10-CM | POA: Diagnosis not present

## 2017-03-22 DIAGNOSIS — H353221 Exudative age-related macular degeneration, left eye, with active choroidal neovascularization: Secondary | ICD-10-CM | POA: Diagnosis not present

## 2017-03-22 DIAGNOSIS — H353211 Exudative age-related macular degeneration, right eye, with active choroidal neovascularization: Secondary | ICD-10-CM | POA: Diagnosis not present

## 2017-03-28 DIAGNOSIS — Z79899 Other long term (current) drug therapy: Secondary | ICD-10-CM | POA: Diagnosis not present

## 2017-03-28 DIAGNOSIS — L601 Onycholysis: Secondary | ICD-10-CM | POA: Diagnosis not present

## 2017-03-29 DIAGNOSIS — H353211 Exudative age-related macular degeneration, right eye, with active choroidal neovascularization: Secondary | ICD-10-CM | POA: Diagnosis not present

## 2017-03-29 DIAGNOSIS — H353221 Exudative age-related macular degeneration, left eye, with active choroidal neovascularization: Secondary | ICD-10-CM | POA: Diagnosis not present

## 2017-04-26 DIAGNOSIS — H353211 Exudative age-related macular degeneration, right eye, with active choroidal neovascularization: Secondary | ICD-10-CM | POA: Diagnosis not present

## 2017-04-26 DIAGNOSIS — H353221 Exudative age-related macular degeneration, left eye, with active choroidal neovascularization: Secondary | ICD-10-CM | POA: Diagnosis not present

## 2017-04-27 DIAGNOSIS — H353221 Exudative age-related macular degeneration, left eye, with active choroidal neovascularization: Secondary | ICD-10-CM | POA: Diagnosis not present

## 2017-04-29 ENCOUNTER — Other Ambulatory Visit: Payer: Self-pay | Admitting: Cardiovascular Disease

## 2017-05-02 DIAGNOSIS — H43813 Vitreous degeneration, bilateral: Secondary | ICD-10-CM | POA: Diagnosis not present

## 2017-05-02 DIAGNOSIS — Z961 Presence of intraocular lens: Secondary | ICD-10-CM | POA: Diagnosis not present

## 2017-05-02 DIAGNOSIS — H26493 Other secondary cataract, bilateral: Secondary | ICD-10-CM | POA: Diagnosis not present

## 2017-05-02 DIAGNOSIS — H353221 Exudative age-related macular degeneration, left eye, with active choroidal neovascularization: Secondary | ICD-10-CM | POA: Diagnosis not present

## 2017-05-10 DIAGNOSIS — M7061 Trochanteric bursitis, right hip: Secondary | ICD-10-CM | POA: Diagnosis not present

## 2017-05-10 DIAGNOSIS — M1611 Unilateral primary osteoarthritis, right hip: Secondary | ICD-10-CM | POA: Diagnosis not present

## 2017-05-10 DIAGNOSIS — M47812 Spondylosis without myelopathy or radiculopathy, cervical region: Secondary | ICD-10-CM | POA: Diagnosis not present

## 2017-05-19 ENCOUNTER — Other Ambulatory Visit: Payer: Self-pay | Admitting: Neurosurgery

## 2017-05-19 DIAGNOSIS — M7061 Trochanteric bursitis, right hip: Secondary | ICD-10-CM

## 2017-05-26 DIAGNOSIS — H26491 Other secondary cataract, right eye: Secondary | ICD-10-CM | POA: Diagnosis not present

## 2017-05-27 ENCOUNTER — Ambulatory Visit
Admission: RE | Admit: 2017-05-27 | Discharge: 2017-05-27 | Disposition: A | Discharge status: Home / Self Care | Payer: Medicare Other | Source: Ambulatory Visit | Attending: Neurosurgery | Admitting: Neurosurgery

## 2017-05-27 DIAGNOSIS — M7061 Trochanteric bursitis, right hip: Secondary | ICD-10-CM | POA: Diagnosis not present

## 2017-05-27 MED ORDER — METHYLPREDNISOLONE ACETATE 40 MG/ML INJ SUSP (RADIOLOG
120.0000 mg | Freq: Once | INTRAMUSCULAR | Status: AC
  Administered 2017-05-27: 120 mg via INTRA_ARTICULAR

## 2017-05-27 MED ORDER — IOPAMIDOL (ISOVUE-M 200) INJECTION 41%
1.0000 mL | Freq: Once | INTRAMUSCULAR | Status: AC
  Administered 2017-05-27: 1 mL via INTRA_ARTICULAR

## 2017-05-31 DIAGNOSIS — H353221 Exudative age-related macular degeneration, left eye, with active choroidal neovascularization: Secondary | ICD-10-CM | POA: Diagnosis not present

## 2017-05-31 DIAGNOSIS — H353211 Exudative age-related macular degeneration, right eye, with active choroidal neovascularization: Secondary | ICD-10-CM | POA: Diagnosis not present

## 2017-06-01 DIAGNOSIS — H353211 Exudative age-related macular degeneration, right eye, with active choroidal neovascularization: Secondary | ICD-10-CM | POA: Diagnosis not present

## 2017-06-08 DIAGNOSIS — Z85828 Personal history of other malignant neoplasm of skin: Secondary | ICD-10-CM | POA: Diagnosis not present

## 2017-06-08 DIAGNOSIS — Z85820 Personal history of malignant melanoma of skin: Secondary | ICD-10-CM | POA: Diagnosis not present

## 2017-06-08 DIAGNOSIS — B351 Tinea unguium: Secondary | ICD-10-CM | POA: Diagnosis not present

## 2017-06-09 DIAGNOSIS — H26492 Other secondary cataract, left eye: Secondary | ICD-10-CM | POA: Diagnosis not present

## 2017-06-15 ENCOUNTER — Other Ambulatory Visit: Payer: Self-pay | Admitting: Neurosurgery

## 2017-06-15 DIAGNOSIS — M7061 Trochanteric bursitis, right hip: Secondary | ICD-10-CM

## 2017-06-16 ENCOUNTER — Ambulatory Visit
Admission: RE | Admit: 2017-06-16 | Discharge: 2017-06-16 | Disposition: A | Discharge status: Home / Self Care | Payer: Medicare Other | Source: Ambulatory Visit | Attending: Neurosurgery | Admitting: Neurosurgery

## 2017-06-16 DIAGNOSIS — M7061 Trochanteric bursitis, right hip: Secondary | ICD-10-CM | POA: Diagnosis not present

## 2017-06-16 MED ORDER — METHYLPREDNISOLONE ACETATE 40 MG/ML INJ SUSP (RADIOLOG
120.0000 mg | Freq: Once | INTRAMUSCULAR | Status: AC
  Administered 2017-06-16: 120 mg via INTRA_ARTICULAR

## 2017-06-16 MED ORDER — IOPAMIDOL (ISOVUE-M 200) INJECTION 41%
1.0000 mL | Freq: Once | INTRAMUSCULAR | Status: AC
  Administered 2017-06-16: 1 mL via INTRA_ARTICULAR

## 2017-06-16 NOTE — Discharge Instructions (Signed)

## 2017-06-23 DIAGNOSIS — R82998 Other abnormal findings in urine: Secondary | ICD-10-CM | POA: Diagnosis not present

## 2017-06-23 DIAGNOSIS — I10 Essential (primary) hypertension: Secondary | ICD-10-CM | POA: Diagnosis not present

## 2017-06-23 DIAGNOSIS — E7849 Other hyperlipidemia: Secondary | ICD-10-CM | POA: Diagnosis not present

## 2017-06-23 DIAGNOSIS — C73 Malignant neoplasm of thyroid gland: Secondary | ICD-10-CM | POA: Diagnosis not present

## 2017-06-23 DIAGNOSIS — M859 Disorder of bone density and structure, unspecified: Secondary | ICD-10-CM | POA: Diagnosis not present

## 2017-06-24 DIAGNOSIS — E7849 Other hyperlipidemia: Secondary | ICD-10-CM | POA: Diagnosis not present

## 2017-06-28 DIAGNOSIS — H353212 Exudative age-related macular degeneration, right eye, with inactive choroidal neovascularization: Secondary | ICD-10-CM | POA: Diagnosis not present

## 2017-06-28 DIAGNOSIS — Z1212 Encounter for screening for malignant neoplasm of rectum: Secondary | ICD-10-CM | POA: Diagnosis not present

## 2017-06-28 DIAGNOSIS — H353211 Exudative age-related macular degeneration, right eye, with active choroidal neovascularization: Secondary | ICD-10-CM | POA: Diagnosis not present

## 2017-06-30 DIAGNOSIS — Z0000 Encounter for general adult medical examination without abnormal findings: Secondary | ICD-10-CM | POA: Diagnosis not present

## 2017-06-30 DIAGNOSIS — C73 Malignant neoplasm of thyroid gland: Secondary | ICD-10-CM | POA: Diagnosis not present

## 2017-06-30 DIAGNOSIS — I480 Paroxysmal atrial fibrillation: Secondary | ICD-10-CM | POA: Diagnosis not present

## 2017-06-30 DIAGNOSIS — C439 Malignant melanoma of skin, unspecified: Secondary | ICD-10-CM | POA: Diagnosis not present

## 2017-06-30 DIAGNOSIS — H9190 Unspecified hearing loss, unspecified ear: Secondary | ICD-10-CM | POA: Insufficient documentation

## 2017-06-30 DIAGNOSIS — B351 Tinea unguium: Secondary | ICD-10-CM | POA: Insufficient documentation

## 2017-07-06 DIAGNOSIS — H353211 Exudative age-related macular degeneration, right eye, with active choroidal neovascularization: Secondary | ICD-10-CM | POA: Diagnosis not present

## 2017-07-11 ENCOUNTER — Encounter: Payer: Self-pay | Admitting: Podiatry

## 2017-07-11 ENCOUNTER — Other Ambulatory Visit: Payer: Self-pay

## 2017-07-11 ENCOUNTER — Ambulatory Visit: Payer: Medicare Other | Admitting: Podiatry

## 2017-07-11 VITALS — BP 118/76 | HR 52

## 2017-07-11 DIAGNOSIS — L600 Ingrowing nail: Secondary | ICD-10-CM

## 2017-07-11 DIAGNOSIS — M79674 Pain in right toe(s): Secondary | ICD-10-CM

## 2017-07-11 DIAGNOSIS — M79675 Pain in left toe(s): Secondary | ICD-10-CM | POA: Diagnosis not present

## 2017-07-11 DIAGNOSIS — B351 Tinea unguium: Secondary | ICD-10-CM

## 2017-07-12 DIAGNOSIS — M47812 Spondylosis without myelopathy or radiculopathy, cervical region: Secondary | ICD-10-CM | POA: Diagnosis not present

## 2017-07-12 DIAGNOSIS — S0003XA Contusion of scalp, initial encounter: Secondary | ICD-10-CM | POA: Diagnosis not present

## 2017-07-12 DIAGNOSIS — W010XXA Fall on same level from slipping, tripping and stumbling without subsequent striking against object, initial encounter: Secondary | ICD-10-CM | POA: Diagnosis not present

## 2017-07-12 DIAGNOSIS — Z4789 Encounter for other orthopedic aftercare: Secondary | ICD-10-CM | POA: Diagnosis not present

## 2017-07-12 DIAGNOSIS — I4891 Unspecified atrial fibrillation: Secondary | ICD-10-CM | POA: Diagnosis not present

## 2017-07-12 DIAGNOSIS — R220 Localized swelling, mass and lump, head: Secondary | ICD-10-CM | POA: Diagnosis not present

## 2017-07-12 DIAGNOSIS — Z79899 Other long term (current) drug therapy: Secondary | ICD-10-CM | POA: Diagnosis not present

## 2017-07-12 DIAGNOSIS — S199XXA Unspecified injury of neck, initial encounter: Secondary | ICD-10-CM | POA: Diagnosis not present

## 2017-07-12 DIAGNOSIS — S0181XA Laceration without foreign body of other part of head, initial encounter: Secondary | ICD-10-CM | POA: Diagnosis not present

## 2017-07-13 NOTE — Progress Notes (Signed)
Subjective:   Patient ID: Christina [name], female   DOB: 77 y.o.   MRN: [id number]   HPI Patient presents stating that she has a nail problem with pain of her left big toenail and she is not sure if she traumatized it but does not remember specific injury.  Patient also is concerned about ingrown toenail and fungus and patient does not smoke currently and likes to be active   Review of Systems  All other systems reviewed and are negative.       Objective:  Physical Exam  Constitutional: She appears well-developed and well-nourished.  Cardiovascular: Intact distal pulses.  Pulmonary/Chest: Effort normal.  Musculoskeletal: Normal range of motion.  Neurological: She is alert.  Skin: Skin is warm.  Nursing note and vitals reviewed.   Neurovascular status was found to be intact muscle strength range of motion adequate.  Patient is noted to have infected deformed hallux nail left that is dystrophic and painful in the corners with moderate pain and no drainage or redness.  Patient has good digital perfusion and is well oriented x3     Assessment:  Probable traumatized left hallux nail with loss of the distal one half with incurvation of the bed thickness and discomfort with palpation     Plan:  H&P condition reviewed and discussed nail removal versus debridement and today I went ahead using sterile instrumentation I debrided the nailbed I carefully remove the corners to take pressure off and I advised on soaks.  If redness drainage were to occur this may require permanent type procedure

## 2017-07-15 DIAGNOSIS — H353232 Exudative age-related macular degeneration, bilateral, with inactive choroidal neovascularization: Secondary | ICD-10-CM | POA: Diagnosis not present

## 2017-07-15 DIAGNOSIS — H43813 Vitreous degeneration, bilateral: Secondary | ICD-10-CM | POA: Diagnosis not present

## 2017-07-15 DIAGNOSIS — S0012XA Contusion of left eyelid and periocular area, initial encounter: Secondary | ICD-10-CM | POA: Diagnosis not present

## 2017-07-19 DIAGNOSIS — S0191XA Laceration without foreign body of unspecified part of head, initial encounter: Secondary | ICD-10-CM | POA: Diagnosis not present

## 2017-07-19 DIAGNOSIS — I10 Essential (primary) hypertension: Secondary | ICD-10-CM | POA: Diagnosis not present

## 2017-07-19 DIAGNOSIS — Z6824 Body mass index (BMI) 24.0-24.9, adult: Secondary | ICD-10-CM | POA: Diagnosis not present

## 2017-07-22 DIAGNOSIS — I10 Essential (primary) hypertension: Secondary | ICD-10-CM | POA: Diagnosis not present

## 2017-07-22 DIAGNOSIS — S0990XD Unspecified injury of head, subsequent encounter: Secondary | ICD-10-CM | POA: Diagnosis not present

## 2017-07-22 DIAGNOSIS — D320 Benign neoplasm of cerebral meninges: Secondary | ICD-10-CM | POA: Insufficient documentation

## 2017-07-26 ENCOUNTER — Other Ambulatory Visit: Payer: Self-pay | Admitting: Internal Medicine

## 2017-07-26 DIAGNOSIS — H353221 Exudative age-related macular degeneration, left eye, with active choroidal neovascularization: Secondary | ICD-10-CM | POA: Diagnosis not present

## 2017-07-26 DIAGNOSIS — Z86011 Personal history of benign neoplasm of the brain: Secondary | ICD-10-CM

## 2017-07-26 DIAGNOSIS — H353211 Exudative age-related macular degeneration, right eye, with active choroidal neovascularization: Secondary | ICD-10-CM | POA: Diagnosis not present

## 2017-07-26 DIAGNOSIS — H43812 Vitreous degeneration, left eye: Secondary | ICD-10-CM | POA: Diagnosis not present

## 2017-08-02 ENCOUNTER — Ambulatory Visit
Admission: RE | Admit: 2017-08-02 | Discharge: 2017-08-02 | Disposition: A | Discharge status: Home / Self Care | Payer: Medicare Other | Source: Ambulatory Visit | Attending: Internal Medicine | Admitting: Internal Medicine

## 2017-08-02 DIAGNOSIS — Z86011 Personal history of benign neoplasm of the brain: Secondary | ICD-10-CM

## 2017-08-02 DIAGNOSIS — D329 Benign neoplasm of meninges, unspecified: Secondary | ICD-10-CM | POA: Diagnosis not present

## 2017-08-03 DIAGNOSIS — H353221 Exudative age-related macular degeneration, left eye, with active choroidal neovascularization: Secondary | ICD-10-CM | POA: Diagnosis not present

## 2017-08-03 DIAGNOSIS — H353211 Exudative age-related macular degeneration, right eye, with active choroidal neovascularization: Secondary | ICD-10-CM | POA: Diagnosis not present

## 2017-08-11 ENCOUNTER — Other Ambulatory Visit: Payer: Self-pay | Admitting: Internal Medicine

## 2017-08-11 DIAGNOSIS — Z1231 Encounter for screening mammogram for malignant neoplasm of breast: Secondary | ICD-10-CM

## 2017-08-23 ENCOUNTER — Encounter: Payer: Self-pay | Admitting: Sports Medicine

## 2017-08-23 ENCOUNTER — Ambulatory Visit: Payer: Medicare Other | Admitting: Sports Medicine

## 2017-08-23 ENCOUNTER — Other Ambulatory Visit: Payer: Self-pay | Admitting: Sports Medicine

## 2017-08-23 ENCOUNTER — Ambulatory Visit (INDEPENDENT_AMBULATORY_CARE_PROVIDER_SITE_OTHER): Payer: Medicare Other

## 2017-08-23 DIAGNOSIS — L03032 Cellulitis of left toe: Secondary | ICD-10-CM

## 2017-08-23 DIAGNOSIS — M79675 Pain in left toe(s): Secondary | ICD-10-CM

## 2017-08-23 DIAGNOSIS — S90422A Blister (nonthermal), left great toe, initial encounter: Secondary | ICD-10-CM

## 2017-08-23 DIAGNOSIS — L02612 Cutaneous abscess of left foot: Secondary | ICD-10-CM

## 2017-08-23 DIAGNOSIS — L97521 Non-pressure chronic ulcer of other part of left foot limited to breakdown of skin: Secondary | ICD-10-CM

## 2017-08-23 DIAGNOSIS — I739 Peripheral vascular disease, unspecified: Secondary | ICD-10-CM

## 2017-08-23 DIAGNOSIS — D329 Benign neoplasm of meninges, unspecified: Secondary | ICD-10-CM | POA: Diagnosis not present

## 2017-08-23 DIAGNOSIS — M47812 Spondylosis without myelopathy or radiculopathy, cervical region: Secondary | ICD-10-CM | POA: Diagnosis not present

## 2017-08-23 MED ORDER — AMOXICILLIN-POT CLAVULANATE 875-125 MG PO TABS: 1.0000 | ORAL_TABLET | Freq: Two times a day (BID) | ORAL | 0 refills | Status: DC

## 2017-08-23 NOTE — Patient Instructions (Addendum)

## 2017-08-23 NOTE — Progress Notes (Signed)
Subjective: Christina [name] is a 77 y.o. female patient seen in office for evaluation of left 1st toe. Patient states that 1 week ago went for pedicure and then afterwards noticed toe turning black. States that yesterday started to bleed and turned even more black. Reports that she has been having issues with the nail thickening and growing irregular. Admits that she was seen last month where the nail was trimmed but it did not improve so thought that a pedicure would help. Patient denies trauma or injury to toe but does suffer from neuropathy and does not feel her toes. Denies diabetes. Admits to radiculopathy. Denies nausea/fever/vomiting/chills/night sweats/shortness of breath/pain. Patient has no other pedal complaints at this time.  Patient Active Problem List   Diagnosis Date Noted  . Cervical spondylosis without myelopathy 08/23/2017  . Meningioma ([place]) 08/23/2017  . 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 Chronic seasonal allergic rhinitis 05/07/2016  . Tinnitus, bilateral 05/07/2016  . ETD (Eustachian tube dysfunction), bilateral 02/05/2016  . Otorrhagia of left ear 02/05/2016  . Atypical chest pain   . PAF (paroxysmal atrial fibrillation) ([place])   . Precordial pain   . Palpitations 09/16/2015  . Paroxysmal atrial fibrillation ([place]) 08/03/2010  . Hypertension 08/03/2010  . History of breast cancer 08/03/2010  . History of thyroid cancer 08/03/2010  . History of melanoma 08/03/2010  . 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 acetaminophen (TYLENOL) 500 MG tablet Take 500-1,000 mg by mouth daily as needed for mild pain or headache.    . cetirizine (ZYRTEC) 10 MG tablet Take by mouth.    . cyclobenzaprine (FLEXERIL) 10 MG tablet Take by mouth.    . diltiazem (CARDIZEM CD) 120 MG 24 hr capsule TAKE ONE CAPSULE BY MOUTH DAILY 90 capsule 1  . DOCOSAHEXAENOIC ACID PO Take  by mouth.    . Ezetimibe (ZETIA PO) Take 10 mg by mouth daily.    . fish oil-omega-3 fatty acids 1000 MG capsule Take 2 g by mouth daily.     . fluticasone (FLONASE) 50 MCG/ACT nasal spray Place 2 sprays into the nose daily.    . fluticasone (FLOVENT DISKUS) 50 MCG/BLIST diskus inhaler Inhale into the lungs.    . Glucosamine 500 MG TABS Take 1,000 mg by mouth daily.     Marland Kitchen ipratropium (ATROVENT HFA) 17 MCG/ACT inhaler Inhale into the lungs.    Marland Kitchen ipratropium (ATROVENT) 0.03 % nasal spray Place 2 sprays into the nose 2 (two) times daily as needed for rhinitis.     Marland Kitchen levothyroxine (SYNTHROID, LEVOTHROID) 150 MCG tablet Take 150 mcg by mouth daily before breakfast. 150 mcg Mon - Sat 37.5 mcg on Sunday    . Multiple Vitamins-Minerals (PRESERVISION AREDS) CAPS Take by mouth.    . nebivolol (BYSTOLIC) 5 MG tablet TAKE 1/2 TABLET BY MOUTH DAILY (2.5 MG TOTAL)    . omeprazole (PRILOSEC) 20 MG capsule Take 20 mg by mouth daily.    . potassium chloride SA (K-DUR,KLOR-CON) 20 MEQ tablet Take 40 mEq by mouth daily.     . simvastatin (ZOCOR) 40 MG tablet Take 40 mg by mouth at bedtime.      . triamterene-hydrochlorothiazide (MAXZIDE) 75-50 MG per tablet TAKE 1/2 TABLET BY MOUTH DAILY    . XARELTO 20 MG TABS tablet TAKE 1 TABLET BY MOUTH EACH EVENING AS DIRECTED WITH DINNER 30 tablet 6   No current facility-administered medications on file  prior to visit.    Allergies  Allergen Reactions  . Daypro [Oxaprozin] Hives  . Prednisone Itching and Other (See Comments)    leg swelling /dyuria   . Tegretol [Carbamazepine] Hives    No results found for this or any previous visit (from the past 2160 hour(s)).  Objective: There were no vitals filed for this visit.  General: Patient is awake, alert, oriented x 3 and in no acute distress.  Dermatology: Skin is warm and dry blood blister at distal tuft and at distal nail fold with severely mycotic left 1st toenail, after debridement there is a partial thickness  ulceration present at distal tuft of left 1st toe. Ulceration measures 0.2 cm x 0.2 cm x 0.1cm. There is a keratotic bloddy blistered border with a granular base. The ulceration does not probe to bone. There is no malodor, no active drainage, faint erythema, faint edema. No other acute signs of infection.      Vascular: Dorsalis Pedis pulse = 1/4 Bilateral,  Posterior Tibial pulse = 1/4 Bilateral,  Varicose veins and trace edema bilateral, Capillary Fill Time < 5 seconds  Neurologic: Protective sensation absent to the level of lower leg using  the 5.07/10g [employer].  Musculosketal:  No Pain with palpation to ulcerated area at left great toe.   Xrays, Left foot:At area of ulceration/blood blister to toe, No bony destruction suggestive of osteomyelitis. No gas in soft tissues.   No results for input(s): GRAMSTAIN, LABORGA in the last 8760 hours.  Assessment and Plan:  Problem List Items Addressed This Visit    None    Visit Diagnoses    Blister of left great toe, initial encounter    -  Primary   Relevant Medications   amoxicillin-clavulanate (AUGMENTIN) 875-125 MG tablet   Toe ulcer, left, limited to breakdown of skin (HCC)       Cellulitis and abscess of toe of left foot       Relevant Medications   amoxicillin-clavulanate (AUGMENTIN) 875-125 MG tablet   Pain of toe of left foot       Relevant Orders   DG Foot Complete Left   PVD (peripheral vascular disease) ([place])         -Examined patient and discussed the progression of the wound and treatment alternatives. -Xrays reviewed - Excisionally dedbrided ulceration at left 1st toe to healthy bleeding borders removing nonviable tissue using a sterile chisel blade. Wound measures post debridement as above. Wound was debrided to the level of the dermis with viable wound base exposed to promote healing. Hemostasis was achieved with manuel pressure and surgicel. Patient tolerated procedure well without any discomfort  or anesthesia necessary for this wound debridement.  -Applied surgicel and dry sterile dressing and instructed patient to continue with daily dressings at home consisting of same if bleeding or antibiotic cream and bandaid/dry sterile dressing if not bleeding. -May soak with Epsom salt to assist with healing -Rx Augmentin for preventative measures -ABIs in office are normal 1.22 on left and 1.2 on right - Advised patient to go to the ER or return to office if the wound worsens or if constitutional symptoms are present. -Patient to return to office in 2 weeks for follow up care and evaluation or sooner if problems arise.  Landis Martins, DPM

## 2017-08-30 DIAGNOSIS — H353221 Exudative age-related macular degeneration, left eye, with active choroidal neovascularization: Secondary | ICD-10-CM | POA: Diagnosis not present

## 2017-09-01 DIAGNOSIS — H353211 Exudative age-related macular degeneration, right eye, with active choroidal neovascularization: Secondary | ICD-10-CM | POA: Diagnosis not present

## 2017-09-06 ENCOUNTER — Encounter: Payer: Self-pay | Admitting: Sports Medicine

## 2017-09-06 ENCOUNTER — Ambulatory Visit (INDEPENDENT_AMBULATORY_CARE_PROVIDER_SITE_OTHER): Payer: Medicare Other | Admitting: Sports Medicine

## 2017-09-06 DIAGNOSIS — I739 Peripheral vascular disease, unspecified: Secondary | ICD-10-CM

## 2017-09-06 DIAGNOSIS — M79675 Pain in left toe(s): Secondary | ICD-10-CM | POA: Diagnosis not present

## 2017-09-06 DIAGNOSIS — L97521 Non-pressure chronic ulcer of other part of left foot limited to breakdown of skin: Secondary | ICD-10-CM

## 2017-09-06 NOTE — Progress Notes (Signed)
Subjective: Christina [name] is a 77 y.o. female patient seen in office for evaluation of ulceration of the Left 1st toe. Patient has a history of PVD on Xarelto.   Patient is changing the dressing using antibiotic cream to toe on left with help from husband. Denies nausea/fever/vomiting/chills/night sweats/shortness of breath/pain. Patient finished Augmentin without any problems. Patient has no other pedal complaints at this time. Husband reports that he is upset that Dr. Paulla Dolly did not check the toe and want to discuss nail fungus.   Patient Active Problem List   Diagnosis Date Noted  . Cervical spondylosis without myelopathy 08/23/2017  . Meningioma ([place]) 08/23/2017  . 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 Chronic seasonal allergic rhinitis 05/07/2016  . Tinnitus, bilateral 05/07/2016  . ETD (Eustachian tube dysfunction), bilateral 02/05/2016  . Otorrhagia of left ear 02/05/2016  . Atypical chest pain   . PAF (paroxysmal atrial fibrillation) ([place])   . Precordial pain   . Palpitations 09/16/2015  . Paroxysmal atrial fibrillation ([place]) 08/03/2010  . Hypertension 08/03/2010  . History of breast cancer 08/03/2010  . History of thyroid cancer 08/03/2010  . History of melanoma 08/03/2010  . 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 acetaminophen (TYLENOL) 500 MG tablet Take 500-1,000 mg by mouth daily as needed for mild pain or headache.    Marland Kitchen amoxicillin-clavulanate (AUGMENTIN) 875-125 MG tablet Take 1 tablet by mouth 2 (two) times daily. 28 tablet 0  . cetirizine (ZYRTEC) 10 MG tablet Take by mouth.    . cyclobenzaprine (FLEXERIL) 10 MG tablet Take by mouth.    . diltiazem (CARDIZEM CD) 120 MG 24 hr capsule TAKE ONE CAPSULE BY MOUTH DAILY 90 capsule 1  . DOCOSAHEXAENOIC ACID PO Take by mouth.    . Ezetimibe (ZETIA PO) Take 10 mg by mouth daily.    . fish oil-omega-3  fatty acids 1000 MG capsule Take 2 g by mouth daily.     . fluticasone (FLONASE) 50 MCG/ACT nasal spray Place 2 sprays into the nose daily.    . fluticasone (FLOVENT DISKUS) 50 MCG/BLIST diskus inhaler Inhale into the lungs.    . Glucosamine 500 MG TABS Take 1,000 mg by mouth daily.     Marland Kitchen ipratropium (ATROVENT HFA) 17 MCG/ACT inhaler Inhale into the lungs.    Marland Kitchen ipratropium (ATROVENT) 0.03 % nasal spray Place 2 sprays into the nose 2 (two) times daily as needed for rhinitis.     Marland Kitchen levothyroxine (SYNTHROID, LEVOTHROID) 150 MCG tablet Take 150 mcg by mouth daily before breakfast. 150 mcg Mon - Sat 37.5 mcg on Sunday    . Multiple Vitamins-Minerals (PRESERVISION AREDS) CAPS Take by mouth.    . nebivolol (BYSTOLIC) 5 MG tablet TAKE 1/2 TABLET BY MOUTH DAILY (2.5 MG TOTAL)    . omeprazole (PRILOSEC) 20 MG capsule Take 20 mg by mouth daily.    . potassium chloride SA (K-DUR,KLOR-CON) 20 MEQ tablet Take 40 mEq by mouth daily.     . simvastatin (ZOCOR) 40 MG tablet Take 40 mg by mouth at bedtime.      . triamterene-hydrochlorothiazide (MAXZIDE) 75-50 MG per tablet TAKE 1/2 TABLET BY MOUTH DAILY    . XARELTO 20 MG TABS tablet TAKE 1 TABLET BY MOUTH EACH EVENING AS DIRECTED WITH DINNER 30 tablet 6   No current facility-administered medications on file prior to visit.    Allergies  Allergen Reactions  . Amoxicillin-Pot  Clavulanate Diarrhea and Nausea Only  . Daypro [Oxaprozin] Hives  . Prednisone Itching and Other (See Comments)    leg swelling /dyuria   . Tegretol [Carbamazepine] Hives    No results found for this or any previous visit (from the past 2160 hour(s)).  Objective: There were no vitals filed for this visit.  General: Patient is awake, alert, oriented x 3 and in no acute distress.  Dermatology: Skin is warm and dry bilateral with a partial thickness ulceration present distal tuft of left 1st toe. Ulceration measures 0.2 cm x 0.2cm x 0.1 cm. There is a keratotic border with a  granular base. The ulceration does not  probe to bone. There is no malodor, no active drainage, no erythema, no edema. No acute signs of infection.   Vascular: Dorsalis Pedis pulse =  1/4 Bilateral,  Posterior Tibial pulse = 1/4 Bilateral,  Capillary Fill Time < 5 seconds  Neurologic: Protective sensation absent to the level of lower 1/3 of leg using  the 5.07/10g [employer].  Musculosketal: No Pain with palpation to ulcerated area.   No results for input(s): GRAMSTAIN, LABORGA in the last 8760 hours.  Assessment and Plan:  Problem List Items Addressed This Visit    None    Visit Diagnoses    Toe ulcer, left, limited to breakdown of skin ([place])    -  Primary   Pain of toe of left foot       PVD (peripheral vascular disease) ([place])           -Examined patient and discussed the progression of the wound and treatment alternatives. -Xrays reviewed - Excisionally dedbrided ulceration at left 1st toe to healthy bleeding borders removing nonviable tissue using a sterile chisel blade. Wound measures post debridement as above. Wound was debrided to the level of the dermis with viable wound base exposed to promote healing. Hemostasis was achieved with manuel pressure. Patient tolerated procedure well without any discomfort or anesthesia necessary for this wound debridement.  -Applied antibiotic cream and dry sterile dressing and instructed patient to continue with daily dressings at home consisting of same. -Advised patient that we will see how things grow out before starting to discuss any treatment for fungus in nail meanwhile may soak with epsom salt - Advised patient to go to the ER or return to office if the wound worsens or if constitutional symptoms are present. -Patient to return to office in 3-4 weeks for follow up care and evaluation or sooner if problems arise.  Landis Martins, DPM

## 2017-09-14 ENCOUNTER — Ambulatory Visit
Admission: RE | Admit: 2017-09-14 | Discharge: 2017-09-14 | Disposition: A | Discharge status: Home / Self Care | Payer: Medicare Other | Source: Ambulatory Visit | Attending: Internal Medicine | Admitting: Internal Medicine

## 2017-09-14 DIAGNOSIS — Z1231 Encounter for screening mammogram for malignant neoplasm of breast: Secondary | ICD-10-CM

## 2017-09-27 DIAGNOSIS — H353221 Exudative age-related macular degeneration, left eye, with active choroidal neovascularization: Secondary | ICD-10-CM | POA: Diagnosis not present

## 2017-09-27 DIAGNOSIS — H353211 Exudative age-related macular degeneration, right eye, with active choroidal neovascularization: Secondary | ICD-10-CM | POA: Diagnosis not present

## 2017-09-29 DIAGNOSIS — H353211 Exudative age-related macular degeneration, right eye, with active choroidal neovascularization: Secondary | ICD-10-CM | POA: Diagnosis not present

## 2017-09-29 DIAGNOSIS — H353221 Exudative age-related macular degeneration, left eye, with active choroidal neovascularization: Secondary | ICD-10-CM | POA: Diagnosis not present

## 2017-10-04 ENCOUNTER — Encounter: Payer: Self-pay | Admitting: Sports Medicine

## 2017-10-04 ENCOUNTER — Ambulatory Visit (INDEPENDENT_AMBULATORY_CARE_PROVIDER_SITE_OTHER): Payer: Medicare Other | Admitting: Sports Medicine

## 2017-10-04 DIAGNOSIS — L97521 Non-pressure chronic ulcer of other part of left foot limited to breakdown of skin: Secondary | ICD-10-CM

## 2017-10-04 DIAGNOSIS — M79675 Pain in left toe(s): Secondary | ICD-10-CM

## 2017-10-04 DIAGNOSIS — I739 Peripheral vascular disease, unspecified: Secondary | ICD-10-CM

## 2017-10-04 NOTE — Progress Notes (Signed)
Subjective: Christina [name] is a 77 y.o. female patient seen in office for evaluation of ulceration of the Left 1st toe. Patient has a history of PVD on Xarelto.   Patient is changing the dressing using antibiotic cream to toe on left states that it was doing good but now has been draining clear fluid.  Denies nausea/fever/vomiting/chills/night sweats/shortness of breath/pain.   Patient Active Problem List   Diagnosis Date Noted  . Cervical spondylosis without myelopathy 08/23/2017  . Meningioma ([place]) 08/23/2017  . 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 Chronic seasonal allergic rhinitis 05/07/2016  . Tinnitus, bilateral 05/07/2016  . ETD (Eustachian tube dysfunction), bilateral 02/05/2016  . Otorrhagia of left ear 02/05/2016  . Atypical chest pain   . PAF (paroxysmal atrial fibrillation) ([place])   . Precordial pain   . Palpitations 09/16/2015  . Paroxysmal atrial fibrillation ([place]) 08/03/2010  . Hypertension 08/03/2010  . History of breast cancer 08/03/2010  . History of thyroid cancer 08/03/2010  . History of melanoma 08/03/2010  . 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 acetaminophen (TYLENOL) 500 MG tablet Take 500-1,000 mg by mouth daily as needed for mild pain or headache.    Marland Kitchen amoxicillin-clavulanate (AUGMENTIN) 875-125 MG tablet Take 1 tablet by mouth 2 (two) times daily. 28 tablet 0  . cetirizine (ZYRTEC) 10 MG tablet Take by mouth.    . cyclobenzaprine (FLEXERIL) 10 MG tablet Take by mouth.    . diltiazem (CARDIZEM CD) 120 MG 24 hr capsule TAKE ONE CAPSULE BY MOUTH DAILY 90 capsule 1  . DOCOSAHEXAENOIC ACID PO Take by mouth.    . Ezetimibe (ZETIA PO) Take 10 mg by mouth daily.    . fish oil-omega-3 fatty acids 1000 MG capsule Take 2 g by mouth daily.     . fluticasone (FLONASE) 50 MCG/ACT nasal spray Place 2 sprays into the nose daily.    . fluticasone  (FLOVENT DISKUS) 50 MCG/BLIST diskus inhaler Inhale into the lungs.    . Glucosamine 500 MG TABS Take 1,000 mg by mouth daily.     Marland Kitchen ipratropium (ATROVENT HFA) 17 MCG/ACT inhaler Inhale into the lungs.    Marland Kitchen ipratropium (ATROVENT) 0.03 % nasal spray Place 2 sprays into the nose 2 (two) times daily as needed for rhinitis.     Marland Kitchen levothyroxine (SYNTHROID, LEVOTHROID) 150 MCG tablet Take 150 mcg by mouth daily before breakfast. 150 mcg Mon - Sat 37.5 mcg on Sunday    . Multiple Vitamins-Minerals (PRESERVISION AREDS) CAPS Take by mouth.    . nebivolol (BYSTOLIC) 5 MG tablet TAKE 1/2 TABLET BY MOUTH DAILY (2.5 MG TOTAL)    . omeprazole (PRILOSEC) 20 MG capsule Take 20 mg by mouth daily.    . potassium chloride SA (K-DUR,KLOR-CON) 20 MEQ tablet Take 40 mEq by mouth daily.     . simvastatin (ZOCOR) 40 MG tablet Take 40 mg by mouth at bedtime.      . triamterene-hydrochlorothiazide (MAXZIDE) 75-50 MG per tablet TAKE 1/2 TABLET BY MOUTH DAILY    . XARELTO 20 MG TABS tablet TAKE 1 TABLET BY MOUTH EACH EVENING AS DIRECTED WITH DINNER 30 tablet 6   No current facility-administered medications on file prior to visit.    Allergies  Allergen Reactions  . Amoxicillin-Pot Clavulanate Diarrhea and Nausea Only  . Daypro [Oxaprozin] Hives  . Prednisone Itching and Other (See Comments)    leg swelling /dyuria   .  Tegretol [Carbamazepine] Hives    No results found for this or any previous visit (from the past 2160 hour(s)).  Objective: There were no vitals filed for this visit.  General: Patient is awake, alert, oriented x 3 and in no acute distress.  Dermatology: Skin is warm and dry bilateral with a partial thickness ulceration present distal tuft of left 1st toe. Ulceration measures 0.2 cm x 0.2cm x 0.1 cm. There is a keratotic border with a granular base. The ulceration does not  probe to bone. There is no malodor, clear active drainage, no erythema, no edema. Left 1st toenail is lifting. No acute  signs of infection.   Vascular: Dorsalis Pedis pulse =  1/4 Bilateral,  Posterior Tibial pulse = 1/4 Bilateral,  Capillary Fill Time < 5 seconds  Neurologic: Protective sensation absent to the level of lower 1/3 of leg using  the 5.07/10g [employer].  Musculosketal: No Pain with palpation to ulcerated area.   No results for input(s): GRAMSTAIN, LABORGA in the last 8760 hours.  Assessment and Plan:  Problem List Items Addressed This Visit    None    Visit Diagnoses    Toe ulcer, left, limited to breakdown of skin ([place])    -  Primary   Relevant Orders   WOUND CULTURE   Pain of toe of left foot       PVD (peripheral vascular disease) (H[facility])       Relevant Medications   omega-3 acid ethyl esters (LOVAZA) 1 g capsule   diltiazem (CARDIZEM) 120 MG tablet   digoxin (LANOXIN) 0.125 MG tablet     -Examined patient and discussed the progression of the wound and treatment alternatives. - Excisionally dedbrided ulceration at left 1st toe to healthy bleeding borders removing nonviable tissue using a sterile chisel blade. Wound measures post debridement as above. Wound was debrided to the level of the dermis with viable wound base exposed to promote healing. Hemostasis was achieved with manuel pressure. Patient tolerated procedure well without any discomfort or anesthesia necessary for this wound debridement. Wound culture obtained. Will call if need to start antibiotics based on culture results  -Applied Iodosorb and dry sterile dressing and instructed patient to continue with daily dressings at home consisting of same. -Refrain from soaking and closed toed shoes at this time - Advised patient to go to the ER or return to office if the wound worsens or if constitutional symptoms are present. -Patient to return to office in 2 weeks for follow up care and evaluation or sooner if problems arise.  Landis Martins, DPM

## 2017-10-10 ENCOUNTER — Telehealth: Payer: Self-pay | Admitting: *Deleted

## 2017-10-10 LAB — WOUND CULTURE
MICRO NUMBER:: 90965793
SPECIMEN QUALITY: ADEQUATE

## 2017-10-10 MED ORDER — DOXYCYCLINE HYCLATE 100 MG PO CAPS: 100.0000 mg | ORAL_CAPSULE | Freq: Two times a day (BID) | ORAL | 0 refills | Status: DC

## 2017-10-10 NOTE — Telephone Encounter (Signed)
Left message on home phone informing pt of Dr. Leeanne Rio review of orders and explained I was leaving the message on her phone due to the importance.

## 2017-10-10 NOTE — Telephone Encounter (Signed)
Left message on mobile phone as left on home phone.

## 2017-10-10 NOTE — Telephone Encounter (Signed)
-----   Message from Landis Martins, [place] sent at 10/07/2017 12:53 PM EDT ----- Culture + for Strept Send Doxycyline 100mg  x 10 days to pharmacy

## 2017-10-20 DIAGNOSIS — M7061 Trochanteric bursitis, right hip: Secondary | ICD-10-CM | POA: Diagnosis not present

## 2017-10-25 ENCOUNTER — Ambulatory Visit: Payer: Medicare Other | Admitting: Sports Medicine

## 2017-10-25 ENCOUNTER — Other Ambulatory Visit: Payer: Self-pay | Admitting: Neurosurgery

## 2017-10-25 ENCOUNTER — Encounter: Payer: Self-pay | Admitting: Sports Medicine

## 2017-10-25 DIAGNOSIS — M79675 Pain in left toe(s): Secondary | ICD-10-CM

## 2017-10-25 DIAGNOSIS — I739 Peripheral vascular disease, unspecified: Secondary | ICD-10-CM

## 2017-10-25 DIAGNOSIS — L97521 Non-pressure chronic ulcer of other part of left foot limited to breakdown of skin: Secondary | ICD-10-CM

## 2017-10-25 DIAGNOSIS — H353221 Exudative age-related macular degeneration, left eye, with active choroidal neovascularization: Secondary | ICD-10-CM | POA: Diagnosis not present

## 2017-10-25 DIAGNOSIS — M7061 Trochanteric bursitis, right hip: Secondary | ICD-10-CM

## 2017-10-25 DIAGNOSIS — H353211 Exudative age-related macular degeneration, right eye, with active choroidal neovascularization: Secondary | ICD-10-CM | POA: Diagnosis not present

## 2017-10-25 NOTE — Progress Notes (Signed)
Subjective: Christina [name] is a 77 y.o. female patient seen in office for evaluation of ulceration of the Left 1st toe. Patient has a history of PVD on Xarelto.   Patient is changing the dressing using Iodosorb and reports that the area has remained dry with no drainage however there is significant callus buildup at the tip of the toe.  Patient denies nausea/fever/vomiting/chills/night sweats/shortness of breath/pain.   Patient Active Problem List   Diagnosis Date Noted  . Cervical spondylosis without myelopathy 08/23/2017  . Meningioma ([place]) 08/23/2017  . 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 Chronic seasonal allergic rhinitis 05/07/2016  . Tinnitus, bilateral 05/07/2016  . ETD (Eustachian tube dysfunction), bilateral 02/05/2016  . Otorrhagia of left ear 02/05/2016  . Atypical chest pain   . PAF (paroxysmal atrial fibrillation) ([place])   . Precordial pain   . Palpitations 09/16/2015  . Paroxysmal atrial fibrillation ([place]) 08/03/2010  . Hypertension 08/03/2010  . History of breast cancer 08/03/2010  . History of thyroid cancer 08/03/2010  . History of melanoma 08/03/2010  . 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 acetaminophen (TYLENOL) 500 MG tablet Take 500-1,000 mg by mouth daily as needed for mild pain or headache.    Marland Kitchen amoxicillin-clavulanate (AUGMENTIN) 875-125 MG tablet Take 1 tablet by mouth 2 (two) times daily. 28 tablet 0  . cetirizine (ZYRTEC) 10 MG tablet Take by mouth.    . cyclobenzaprine (FLEXERIL) 10 MG tablet Take by mouth.    . digoxin (LANOXIN) 0.125 MG tablet TAKE 1 TABLET BY MOUTH DAILY    . diltiazem (CARDIZEM CD) 120 MG 24 hr capsule TAKE ONE CAPSULE BY MOUTH DAILY 90 capsule 1  . diltiazem (CARDIZEM) 120 MG tablet Take by mouth.    . DOCOSAHEXAENOIC ACID PO Take by mouth.    . doxycycline (VIBRAMYCIN) 100 MG capsule Take 1 capsule (100 mg  total) by mouth 2 (two) times daily. 20 capsule 0  . Ezetimibe (ZETIA PO) Take 10 mg by mouth daily.    . fish oil-omega-3 fatty acids 1000 MG capsule Take 2 g by mouth daily.     . fluticasone (FLONASE) 50 MCG/ACT nasal spray Place 2 sprays into the nose daily.    . fluticasone (FLOVENT DISKUS) 50 MCG/BLIST diskus inhaler Inhale into the lungs.    . Glucosamine 500 MG TABS Take 1,000 mg by mouth daily.     . Glucosamine-Chondroitin 1500-1200 MG/30ML LIQD Take by mouth.    Marland Kitchen ipratropium (ATROVENT HFA) 17 MCG/ACT inhaler Inhale into the lungs.    Marland Kitchen ipratropium (ATROVENT) 0.03 % nasal spray Place 2 sprays into the nose 2 (two) times daily as needed for rhinitis.     Marland Kitchen levothyroxine (SYNTHROID, LEVOTHROID) 150 MCG tablet Take 150 mcg by mouth daily before breakfast. 150 mcg Mon - Sat 37.5 mcg on Sunday    . moxifloxacin (VIGAMOX) 0.5 % ophthalmic solution     . Multiple Vitamins-Minerals (PRESERVISION AREDS) CAPS Take by mouth.    . nebivolol (BYSTOLIC) 5 MG tablet TAKE 1/2 TABLET BY MOUTH DAILY (2.5 MG TOTAL)    . omega-3 acid ethyl esters (LOVAZA) 1 g capsule Take by mouth.    Marland Kitchen omeprazole (PRILOSEC) 20 MG capsule Take 20 mg by mouth daily.    . potassium chloride SA (K-DUR,KLOR-CON) 20 MEQ tablet Take 40 mEq by mouth daily.     . prednisoLONE acetate (PRED FORTE) 1 % ophthalmic  suspension     . simvastatin (ZOCOR) 40 MG tablet Take 40 mg by mouth at bedtime.      . triamterene-hydrochlorothiazide (MAXZIDE) 75-50 MG per tablet TAKE 1/2 TABLET BY MOUTH DAILY    . XARELTO 20 MG TABS tablet TAKE 1 TABLET BY MOUTH EACH EVENING AS DIRECTED WITH DINNER 30 tablet 6   No current facility-administered medications on file prior to visit.    Allergies  Allergen Reactions  . Amoxicillin-Pot Clavulanate Diarrhea and Nausea Only  . Daypro [Oxaprozin] Hives  . Prednisone Itching and Other (See Comments)    leg swelling /dyuria   . Tegretol [Carbamazepine] Hives    Recent Results (from the past 2160  hour(s))  WOUND CULTURE     Status: Abnormal   Collection Time: 10/04/17  4:30 PM  Result Value Ref Range   MICRO NUMBER: [id number]    SPECIMEN QUALITY: ADEQUATE    SOURCE: 1ST TOE ON LEFT FOOT    STATUS: FINAL    GRAM STAIN:      No white blood cells seen No epithelial cells seen Few Gram negative bacilli   ISOLATE 1: Enterobacter cloacae complex (A)     Comment: Heavy growth of Enterobacter cloacae complex   ISOLATE 2: Streptococcus agalactiae (A)     Comment: Moderate growth of Streptococcus agalactiae      Susceptibility   Streptococcus agalactiae - AEROBIC CULT, GRAM STAIN POSITIVE 2    VANCOMYCIN 0.5 Sensitive     CLINDAMYCIN <=0.25 Sensitive     PENO - penicillin <=0.06 Sensitive     AMPICILLIN <=0.25 Sensitive     CEFOTAXIME <=0.12 Sensitive     CEFTRIAXONE <=0.12 Sensitive     LEVOFLOXACIN* 1 Sensitive      * For infections other than uncomplicated UTIcaused by E. coli, K. pneumoniae or P. mirabilis:Cefazolin is resistant if MIC > or = 8 mcg/mL.(Distinguishing susceptible versus intermediatefor isolates with MIC < or = 4 mcg/mL requiresadditional testing.)Legend:S = Susceptible  I = IntermediateR = Resistant  NS = Not susceptible* = Not tested  NR = Not reported**NN = See antimicrobic comments   Enterobacter cloacae complex - AEROBIC CULT, GRAM STAIN NEGATIVE 1    AMOX/CLAVULANIC >=32 Resistant     CEFAZOLIN* >=64 Resistant      * For infections other than uncomplicated UTIcaused by E. coli, K. pneumoniae or P. mirabilis:Cefazolin is resistant if MIC > or = 8 mcg/mL.(Distinguishing susceptible versus intermediatefor isolates with MIC < or = 4 mcg/mL requiresadditional testing.)    CEFEPIME <=1 Sensitive     CEFTRIAXONE >=64 Resistant     CIPROFLOXACIN <=0.25 Sensitive     LEVOFLOXACIN <=0.12 Sensitive     ERTAPENEM <=0.5 Sensitive     GENTAMICIN <=1 Sensitive     IMIPENEM <=0.25 Sensitive     PIP/TAZO >=128 Resistant     TOBRAMYCIN <=1 Sensitive     TRIMETH/SULFA <=20  Sensitive     Objective: There were no vitals filed for this visit.  General: Patient is awake, alert, oriented x 3 and in no acute distress.  Dermatology: Skin is warm and dry bilateral with a partial thickness ulceration present distal tuft of left 1st toe. Ulceration measures 0.1 cm x 0.2cm x 0.1 cm. There is a significant keratotic border with a granular base. The ulceration does not probe to bone. There is no malodor, no active drainage, no erythema, no edema. Left 1st toenail is growing out now attached. No acute signs of infection.   Vascular: Dorsalis Pedis  pulse =  1/4 Bilateral,  Posterior Tibial pulse = 1/4 Bilateral,  Capillary Fill Time < 5 seconds  Neurologic: Protective sensation absent to the level of lower 1/3 of leg using  the 5.07/10g [employer].  Musculosketal: No Pain with palpation to ulcerated area.   No results for input(s): GRAMSTAIN, LABORGA in the last 8760 hours.  Assessment and Plan:  Problem List Items Addressed This Visit    None    Visit Diagnoses    Toe ulcer, left, limited to breakdown of skin ([place])    -  Primary   Pain of toe of left foot       PVD (peripheral vascular disease) ([place])         -Examined patient and discussed the progression of the wound and treatment alternatives. - Excisionally dedbrided ulceration at left 1st toe to healthy bleeding borders removing nonviable tissue using a sterile chisel blade. Wound measures post debridement as above. Wound was debrided to the level of the dermis with viable wound base exposed to promote healing. Hemostasis was achieved with manuel pressure. Patient tolerated procedure well without any discomfort or anesthesia necessary for this wound debridement. -No additional antibiotics needed at this time since there is no acute infection -Applied Iodosorb and dry sterile dressing and instructed patient to continue with daily dressings at home consisting of same. -Refrain from soaking and  closed toed shoes at this time - Advised patient to go to the ER or return to office if the wound worsens or if constitutional symptoms are present. -Patient to return to office in 3 weeks for follow up care and evaluation or sooner if problems arise.  Landis Martins, DPM

## 2017-10-27 DIAGNOSIS — H353211 Exudative age-related macular degeneration, right eye, with active choroidal neovascularization: Secondary | ICD-10-CM | POA: Diagnosis not present

## 2017-10-31 ENCOUNTER — Ambulatory Visit
Admission: RE | Admit: 2017-10-31 | Discharge: 2017-10-31 | Disposition: A | Discharge status: Home / Self Care | Payer: Medicare Other | Source: Ambulatory Visit | Attending: Neurosurgery | Admitting: Neurosurgery

## 2017-10-31 DIAGNOSIS — M25551 Pain in right hip: Secondary | ICD-10-CM | POA: Diagnosis not present

## 2017-10-31 DIAGNOSIS — M7061 Trochanteric bursitis, right hip: Secondary | ICD-10-CM

## 2017-11-03 DIAGNOSIS — S76011A Strain of muscle, fascia and tendon of right hip, initial encounter: Secondary | ICD-10-CM | POA: Diagnosis not present

## 2017-11-03 DIAGNOSIS — M7061 Trochanteric bursitis, right hip: Secondary | ICD-10-CM | POA: Diagnosis not present

## 2017-11-03 DIAGNOSIS — M545 Low back pain: Secondary | ICD-10-CM | POA: Diagnosis not present

## 2017-11-04 DIAGNOSIS — M25551 Pain in right hip: Secondary | ICD-10-CM | POA: Diagnosis not present

## 2017-11-08 DIAGNOSIS — M25551 Pain in right hip: Secondary | ICD-10-CM | POA: Diagnosis not present

## 2017-11-08 DIAGNOSIS — M7631 Iliotibial band syndrome, right leg: Secondary | ICD-10-CM | POA: Diagnosis not present

## 2017-11-11 DIAGNOSIS — M25551 Pain in right hip: Secondary | ICD-10-CM | POA: Diagnosis not present

## 2017-11-11 DIAGNOSIS — M7631 Iliotibial band syndrome, right leg: Secondary | ICD-10-CM | POA: Diagnosis not present

## 2017-11-15 ENCOUNTER — Ambulatory Visit: Payer: Medicare Other | Admitting: Sports Medicine

## 2017-11-15 ENCOUNTER — Encounter: Payer: Self-pay | Admitting: Sports Medicine

## 2017-11-15 DIAGNOSIS — M79675 Pain in left toe(s): Secondary | ICD-10-CM | POA: Diagnosis not present

## 2017-11-15 DIAGNOSIS — I739 Peripheral vascular disease, unspecified: Secondary | ICD-10-CM | POA: Diagnosis not present

## 2017-11-15 DIAGNOSIS — S90415A Abrasion, left lesser toe(s), initial encounter: Secondary | ICD-10-CM

## 2017-11-15 NOTE — Progress Notes (Signed)
Subjective: Christina [name] is a 77 y.o. female patient seen in office for evaluation of ulceration of the Left 1st toe. Patient has a history of PVD on Xarelto.   Patient is changing the dressing using Iodosorb and reports that she peeled off some loose skin at the 1st toe and made it bleed on the bottom.  Patient denies nausea/fever/vomiting/chills/night sweats/shortness of breath/pain.   Patient Active Problem List   Diagnosis Date Noted  . Cervical spondylosis without myelopathy 08/23/2017  . Meningioma ([place]) 08/23/2017  . 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 Chronic seasonal allergic rhinitis 05/07/2016  . Tinnitus, bilateral 05/07/2016  . ETD (Eustachian tube dysfunction), bilateral 02/05/2016  . Otorrhagia of left ear 02/05/2016  . Atypical chest pain   . PAF (paroxysmal atrial fibrillation) ([place])   . Precordial pain   . Palpitations 09/16/2015  . Paroxysmal atrial fibrillation ([place]) 08/03/2010  . Hypertension 08/03/2010  . History of breast cancer 08/03/2010  . History of thyroid cancer 08/03/2010  . History of melanoma 08/03/2010  . 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 acetaminophen (TYLENOL) 500 MG tablet Take 500-1,000 mg by mouth daily as needed for mild pain or headache.    Marland Kitchen amoxicillin-clavulanate (AUGMENTIN) 875-125 MG tablet Take 1 tablet by mouth 2 (two) times daily. 28 tablet 0  . cetirizine (ZYRTEC) 10 MG tablet Take by mouth.    . cyclobenzaprine (FLEXERIL) 10 MG tablet Take by mouth.    . digoxin (LANOXIN) 0.125 MG tablet TAKE 1 TABLET BY MOUTH DAILY    . diltiazem (CARDIZEM CD) 120 MG 24 hr capsule TAKE ONE CAPSULE BY MOUTH DAILY 90 capsule 1  . diltiazem (CARDIZEM) 120 MG tablet Take by mouth.    . DOCOSAHEXAENOIC ACID PO Take by mouth.    . doxycycline (VIBRAMYCIN) 100 MG capsule Take 1 capsule (100 mg total) by mouth 2 (two) times  daily. 20 capsule 0  . Ezetimibe (ZETIA PO) Take 10 mg by mouth daily.    . fish oil-omega-3 fatty acids 1000 MG capsule Take 2 g by mouth daily.     . fluticasone (FLONASE) 50 MCG/ACT nasal spray Place 2 sprays into the nose daily.    . fluticasone (FLOVENT DISKUS) 50 MCG/BLIST diskus inhaler Inhale into the lungs.    . Glucosamine 500 MG TABS Take 1,000 mg by mouth daily.     . Glucosamine-Chondroitin 1500-1200 MG/30ML LIQD Take by mouth.    Marland Kitchen ipratropium (ATROVENT HFA) 17 MCG/ACT inhaler Inhale into the lungs.    Marland Kitchen ipratropium (ATROVENT) 0.03 % nasal spray Place 2 sprays into the nose 2 (two) times daily as needed for rhinitis.     Marland Kitchen levothyroxine (SYNTHROID, LEVOTHROID) 150 MCG tablet Take 150 mcg by mouth daily before breakfast. 150 mcg Mon - Sat 37.5 mcg on Sunday    . moxifloxacin (VIGAMOX) 0.5 % ophthalmic solution     . Multiple Vitamins-Minerals (PRESERVISION AREDS) CAPS Take by mouth.    . nebivolol (BYSTOLIC) 5 MG tablet TAKE 1/2 TABLET BY MOUTH DAILY (2.5 MG TOTAL)    . omega-3 acid ethyl esters (LOVAZA) 1 g capsule Take by mouth.    Marland Kitchen omeprazole (PRILOSEC) 20 MG capsule Take 20 mg by mouth daily.    . potassium chloride SA (K-DUR,KLOR-CON) 20 MEQ tablet Take 40 mEq by mouth daily.     . prednisoLONE acetate (PRED FORTE) 1 % ophthalmic suspension     .  simvastatin (ZOCOR) 40 MG tablet Take 40 mg by mouth at bedtime.      . triamterene-hydrochlorothiazide (MAXZIDE) 75-50 MG per tablet TAKE 1/2 TABLET BY MOUTH DAILY    . XARELTO 20 MG TABS tablet TAKE 1 TABLET BY MOUTH EACH EVENING AS DIRECTED WITH DINNER 30 tablet 6   No current facility-administered medications on file prior to visit.    Allergies  Allergen Reactions  . Amoxicillin-Pot Clavulanate Diarrhea and Nausea Only  . Daypro [Oxaprozin] Hives  . Prednisone Itching and Other (See Comments)    leg swelling /dyuria   . Tegretol [Carbamazepine] Hives    Recent Results (from the past 2160 hour(s))  WOUND CULTURE      Status: Abnormal   Collection Time: 10/04/17  4:30 PM  Result Value Ref Range   MICRO NUMBER: [id number]    SPECIMEN QUALITY: ADEQUATE    SOURCE: 1ST TOE ON LEFT FOOT    STATUS: FINAL    GRAM STAIN:      No white blood cells seen No epithelial cells seen Few Gram negative bacilli   ISOLATE 1: Enterobacter cloacae complex (A)     Comment: Heavy growth of Enterobacter cloacae complex   ISOLATE 2: Streptococcus agalactiae (A)     Comment: Moderate growth of Streptococcus agalactiae      Susceptibility   Streptococcus agalactiae - AEROBIC CULT, GRAM STAIN POSITIVE 2    VANCOMYCIN 0.5 Sensitive     CLINDAMYCIN <=0.25 Sensitive     PENO - penicillin <=0.06 Sensitive     AMPICILLIN <=0.25 Sensitive     CEFOTAXIME <=0.12 Sensitive     CEFTRIAXONE <=0.12 Sensitive     LEVOFLOXACIN* 1 Sensitive      * For infections other than uncomplicated UTIcaused by E. coli, K. pneumoniae or P. mirabilis:Cefazolin is resistant if MIC > or = 8 mcg/mL.(Distinguishing susceptible versus intermediatefor isolates with MIC < or = 4 mcg/mL requiresadditional testing.)Legend:S = Susceptible  I = IntermediateR = Resistant  NS = Not susceptible* = Not tested  NR = Not reported**NN = See antimicrobic comments   Enterobacter cloacae complex - AEROBIC CULT, GRAM STAIN NEGATIVE 1    AMOX/CLAVULANIC >=32 Resistant     CEFAZOLIN* >=64 Resistant      * For infections other than uncomplicated UTIcaused by E. coli, K. pneumoniae or P. mirabilis:Cefazolin is resistant if MIC > or = 8 mcg/mL.(Distinguishing susceptible versus intermediatefor isolates with MIC < or = 4 mcg/mL requiresadditional testing.)    CEFEPIME <=1 Sensitive     CEFTRIAXONE >=64 Resistant     CIPROFLOXACIN <=0.25 Sensitive     LEVOFLOXACIN <=0.12 Sensitive     ERTAPENEM <=0.5 Sensitive     GENTAMICIN <=1 Sensitive     IMIPENEM <=0.25 Sensitive     PIP/TAZO >=128 Resistant     TOBRAMYCIN <=1 Sensitive     TRIMETH/SULFA <=20 Sensitive      Objective: There were no vitals filed for this visit.  General: Patient is awake, alert, oriented x 3 and in no acute distress.  Dermatology: Skin is warm and dry bilateral with a now healed ulceration present distal tuft of left 1st toe. There is an abrasion to left plantar hallux . There is no malodor, no active drainage, no erythema, no edema. Left 1st toenail short and thick. No acute signs of infection.   Vascular: Dorsalis Pedis pulse =  1/4 Bilateral,  Posterior Tibial pulse = 1/4 Bilateral,  Capillary Fill Time < 5 seconds  Neurologic: Protective sensation absent to  the level of lower 1/3 of leg using  the 5.07/10g [employer].  Musculosketal: No Pain with palpation to ulcerated area.   No results for input(s): GRAMSTAIN, LABORGA in the last 8760 hours.  Assessment and Plan:  Problem List Items Addressed This Visit    None    Visit Diagnoses    Abrasion of toe of left foot, initial encounter    -  Primary   Pain of toe of left foot       PVD (peripheral vascular disease) ([place])         -Examined patient and discussed the progression of the wound and treatment alternatives. - Toe ulcer on left at tip is healed. Recommend antibiotic cream to abrasion at plantar 1st toe. -May try closed in shoes in 1 week -May file left hallux nail with [facility] board weekly or as needed - Advised patient to go to the ER or return to office if the wound worsens or if constitutional symptoms are present. -Patient to return to office in 3 weeks for follow up care/check and evaluation or sooner if problems arise.  Landis Martins, DPM

## 2017-11-16 DIAGNOSIS — M25551 Pain in right hip: Secondary | ICD-10-CM | POA: Diagnosis not present

## 2017-11-16 DIAGNOSIS — M7631 Iliotibial band syndrome, right leg: Secondary | ICD-10-CM | POA: Diagnosis not present

## 2017-11-18 DIAGNOSIS — M25551 Pain in right hip: Secondary | ICD-10-CM | POA: Diagnosis not present

## 2017-11-18 DIAGNOSIS — M7631 Iliotibial band syndrome, right leg: Secondary | ICD-10-CM | POA: Diagnosis not present

## 2017-11-19 DIAGNOSIS — Z23 Encounter for immunization: Secondary | ICD-10-CM | POA: Diagnosis not present

## 2017-11-21 DIAGNOSIS — M7631 Iliotibial band syndrome, right leg: Secondary | ICD-10-CM | POA: Diagnosis not present

## 2017-11-21 DIAGNOSIS — M25551 Pain in right hip: Secondary | ICD-10-CM | POA: Diagnosis not present

## 2017-11-22 DIAGNOSIS — H353221 Exudative age-related macular degeneration, left eye, with active choroidal neovascularization: Secondary | ICD-10-CM | POA: Diagnosis not present

## 2017-11-24 DIAGNOSIS — B351 Tinea unguium: Secondary | ICD-10-CM | POA: Diagnosis not present

## 2017-11-24 DIAGNOSIS — Z85828 Personal history of other malignant neoplasm of skin: Secondary | ICD-10-CM | POA: Diagnosis not present

## 2017-11-24 DIAGNOSIS — Z85820 Personal history of malignant melanoma of skin: Secondary | ICD-10-CM | POA: Diagnosis not present

## 2017-11-24 DIAGNOSIS — H353211 Exudative age-related macular degeneration, right eye, with active choroidal neovascularization: Secondary | ICD-10-CM | POA: Diagnosis not present

## 2017-12-03 ENCOUNTER — Other Ambulatory Visit: Payer: Self-pay | Admitting: Cardiovascular Disease

## 2017-12-05 NOTE — Telephone Encounter (Signed)
Xarelto 20mg  refill request received; pt is 77 yrs old, wt-74.4kg, Crea-1.00 from [facility] on 06/23/17, last seen by Dr. Angelena Form on 11/25/16 and has an appt scheduled on 12/08/17; will send in refill to requested pharmacy.

## 2017-12-06 ENCOUNTER — Ambulatory Visit: Payer: Medicare Other | Admitting: Sports Medicine

## 2017-12-06 ENCOUNTER — Encounter: Payer: Self-pay | Admitting: Sports Medicine

## 2017-12-06 DIAGNOSIS — I739 Peripheral vascular disease, unspecified: Secondary | ICD-10-CM

## 2017-12-06 DIAGNOSIS — S90415D Abrasion, left lesser toe(s), subsequent encounter: Secondary | ICD-10-CM

## 2017-12-06 DIAGNOSIS — M79675 Pain in left toe(s): Secondary | ICD-10-CM | POA: Diagnosis not present

## 2017-12-06 NOTE — Progress Notes (Signed)
Subjective: Christina [name] is a 77 y.o. female patient seen in office for evaluation of abrasion of the Left 1st toe. Patient has a history of PVD on Xarelto.   Patient reports that everything has remained healed and that there is no abrasion anymore at the bottom of her left great toe however does state that her nail is growing back thick and in layers.  Patient denies any pain to the toe or any difficulty with wearing closed in shoes.  Patient denies nausea/fever/vomiting/chills/night sweats/shortness of breath/pain.  No other issues noted.  Patient Active Problem List   Diagnosis Date Noted  . Cervical spondylosis without myelopathy 08/23/2017  . Meningioma ([place]) 08/23/2017  . 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 Chronic seasonal allergic rhinitis 05/07/2016  . Tinnitus, bilateral 05/07/2016  . ETD (Eustachian tube dysfunction), bilateral 02/05/2016  . Otorrhagia of left ear 02/05/2016  . Atypical chest pain   . PAF (paroxysmal atrial fibrillation) ([place])   . Precordial pain   . Palpitations 09/16/2015  . Paroxysmal atrial fibrillation ([place]) 08/03/2010  . Hypertension 08/03/2010  . History of breast cancer 08/03/2010  . History of thyroid cancer 08/03/2010  . History of melanoma 08/03/2010  . 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 acetaminophen (TYLENOL) 500 MG tablet Take 500-1,000 mg by mouth daily as needed for mild pain or headache.    Marland Kitchen amoxicillin-clavulanate (AUGMENTIN) 875-125 MG tablet Take 1 tablet by mouth 2 (two) times daily. 28 tablet 0  . cetirizine (ZYRTEC) 10 MG tablet Take by mouth.    . cyclobenzaprine (FLEXERIL) 10 MG tablet Take by mouth.    . digoxin (LANOXIN) 0.125 MG tablet TAKE 1 TABLET BY MOUTH DAILY    . diltiazem (CARDIZEM CD) 120 MG 24 hr capsule TAKE ONE CAPSULE BY MOUTH DAILY 90 capsule 1  . diltiazem (CARDIZEM) 120 MG tablet Take  by mouth.    . DOCOSAHEXAENOIC ACID PO Take by mouth.    . doxycycline (VIBRAMYCIN) 100 MG capsule Take 1 capsule (100 mg total) by mouth 2 (two) times daily. 20 capsule 0  . Ezetimibe (ZETIA PO) Take 10 mg by mouth daily.    . fish oil-omega-3 fatty acids 1000 MG capsule Take 2 g by mouth daily.     . fluticasone (FLONASE) 50 MCG/ACT nasal spray Place 2 sprays into the nose daily.    . fluticasone (FLOVENT DISKUS) 50 MCG/BLIST diskus inhaler Inhale into the lungs.    . Glucosamine 500 MG TABS Take 1,000 mg by mouth daily.     . Glucosamine-Chondroitin 1500-1200 MG/30ML LIQD Take by mouth.    Marland Kitchen ipratropium (ATROVENT HFA) 17 MCG/ACT inhaler Inhale into the lungs.    Marland Kitchen ipratropium (ATROVENT) 0.03 % nasal spray Place 2 sprays into the nose 2 (two) times daily as needed for rhinitis.     Marland Kitchen levothyroxine (SYNTHROID, LEVOTHROID) 150 MCG tablet Take 150 mcg by mouth daily before breakfast. 150 mcg Mon - Sat 37.5 mcg on Sunday    . moxifloxacin (VIGAMOX) 0.5 % ophthalmic solution     . Multiple Vitamins-Minerals (PRESERVISION AREDS) CAPS Take by mouth.    . nebivolol (BYSTOLIC) 5 MG tablet TAKE 1/2 TABLET BY MOUTH DAILY (2.5 MG TOTAL)    . omega-3 acid ethyl esters (LOVAZA) 1 g capsule Take by mouth.    Marland Kitchen omeprazole (PRILOSEC) 20 MG capsule Take 20 mg by mouth daily.    Marland Kitchen  potassium chloride SA (K-DUR,KLOR-CON) 20 MEQ tablet Take 40 mEq by mouth daily.     . prednisoLONE acetate (PRED FORTE) 1 % ophthalmic suspension     . simvastatin (ZOCOR) 40 MG tablet Take 40 mg by mouth at bedtime.      . triamterene-hydrochlorothiazide (MAXZIDE) 75-50 MG per tablet TAKE 1/2 TABLET BY MOUTH DAILY    . XARELTO 20 MG TABS tablet TAKE 1 TABLET BY MOUTH ONCE EVERY EVENING WITH DINNER 30 tablet 6   No current facility-administered medications on file prior to visit.    Allergies  Allergen Reactions  . Amoxicillin-Pot Clavulanate Diarrhea and Nausea Only  . Daypro [Oxaprozin] Hives  . Prednisone Itching and Other  (See Comments)    leg swelling /dyuria   . Tegretol [Carbamazepine] Hives    Recent Results (from the past 2160 hour(s))  WOUND CULTURE     Status: Abnormal   Collection Time: 10/04/17  4:30 PM  Result Value Ref Range   MICRO NUMBER: [id number]    SPECIMEN QUALITY: ADEQUATE    SOURCE: 1ST TOE ON LEFT FOOT    STATUS: FINAL    GRAM STAIN:      No white blood cells seen No epithelial cells seen Few Gram negative bacilli   ISOLATE 1: Enterobacter cloacae complex (A)     Comment: Heavy growth of Enterobacter cloacae complex   ISOLATE 2: Streptococcus agalactiae (A)     Comment: Moderate growth of Streptococcus agalactiae      Susceptibility   Streptococcus agalactiae - AEROBIC CULT, GRAM STAIN POSITIVE 2    VANCOMYCIN 0.5 Sensitive     CLINDAMYCIN <=0.25 Sensitive     PENO - penicillin <=0.06 Sensitive     AMPICILLIN <=0.25 Sensitive     CEFOTAXIME <=0.12 Sensitive     CEFTRIAXONE <=0.12 Sensitive     LEVOFLOXACIN* 1 Sensitive      * For infections other than uncomplicated UTIcaused by E. coli, K. pneumoniae or P. mirabilis:Cefazolin is resistant if MIC > or = 8 mcg/mL.(Distinguishing susceptible versus intermediatefor isolates with MIC < or = 4 mcg/mL requiresadditional testing.)Legend:S = Susceptible  I = IntermediateR = Resistant  NS = Not susceptible* = Not tested  NR = Not reported**NN = See antimicrobic comments   Enterobacter cloacae complex - AEROBIC CULT, GRAM STAIN NEGATIVE 1    AMOX/CLAVULANIC >=32 Resistant     CEFAZOLIN* >=64 Resistant      * For infections other than uncomplicated UTIcaused by E. coli, K. pneumoniae or P. mirabilis:Cefazolin is resistant if MIC > or = 8 mcg/mL.(Distinguishing susceptible versus intermediatefor isolates with MIC < or = 4 mcg/mL requiresadditional testing.)    CEFEPIME <=1 Sensitive     CEFTRIAXONE >=64 Resistant     CIPROFLOXACIN <=0.25 Sensitive     LEVOFLOXACIN <=0.12 Sensitive     ERTAPENEM <=0.5 Sensitive     GENTAMICIN <=1 Sensitive      IMIPENEM <=0.25 Sensitive     PIP/TAZO >=128 Resistant     TOBRAMYCIN <=1 Sensitive     TRIMETH/SULFA <=20 Sensitive     Objective: There were no vitals filed for this visit.  General: Patient is awake, alert, oriented x 3 and in no acute distress.  Dermatology: Skin is warm and dry bilateral with a now healed ulceration present distal tuft of left 1st toe. There is a healed abrasion to left plantar hallux.  Left hallux nail is thick and mycotic.  No acute signs of infection.   Vascular: Dorsalis Pedis pulse =  1/4 Bilateral,  Posterior Tibial pulse = 1/4 Bilateral,  Capillary Fill Time < 5 seconds  Neurologic: Protective sensation absent to the level of lower 1/3 of leg using  the 5.07/10g [employer].  Musculosketal: No Pain with palpation to ulcerated area.   No results for input(s): GRAMSTAIN, LABORGA in the last 8760 hours.  Assessment and Plan:  Problem List Items Addressed This Visit    None    Visit Diagnoses    Abrasion of toe of left foot, subsequent encounter    -  Primary   Healed   Pain of toe of left foot       Much improved   PVD (peripheral vascular disease) ([place])         -Examined patient and discussed the progression of the healed wound and healed abrasion at left great toe -Advised patient that nail at left great toe may be thickened secondary to the previous ulcer and infection and blistering that she had on the toenail and around the toe skin with ulceration itself advised patient to let the nail grow out however to practice keeping the nail file down to prevent the thickened nail to be snagged on close or socks advised patient to return to office if there is pain around the nail redness warmth swelling drainage or any signs of infection reoccurring -Patient to return to office in 3 3 months for final toe check or sooner if problems arise.  Landis Martins, DPM

## 2017-12-08 ENCOUNTER — Encounter: Payer: Self-pay | Admitting: Cardiovascular Disease

## 2017-12-08 ENCOUNTER — Ambulatory Visit: Payer: Medicare Other | Admitting: Cardiovascular Disease

## 2017-12-08 VITALS — BP 122/90 | HR 54 | Ht 69.0 in | Wt 161.1 lb

## 2017-12-08 DIAGNOSIS — I480 Paroxysmal atrial fibrillation: Secondary | ICD-10-CM

## 2017-12-08 DIAGNOSIS — I2510 Atherosclerotic heart disease of native coronary artery without angina pectoris: Secondary | ICD-10-CM | POA: Diagnosis not present

## 2017-12-08 NOTE — Progress Notes (Signed)
Chief Complaint  Patient presents with  . Follow-up    PAF    History of Present Illness: 77 yo female with history of paroxysmal atrial fibrillation, SVT, PVCs, HTN, breast cancer and mild CAD who is here for follow up. She has been known to have paroxysmal atrial fibrillation for years and was started on Xarelto in 2015. She was seen in the ED 11/22/14 with palpitations, nausea and SOB and was found to be in atrial fibrillation with RVR. She converted to sinus in the ED spontaneously. She reported more frequent episodes of palpitations in July 2017 with associated weakness and chest pain. Digoxin dose reduced in primary care due to elevated levels. 48 hour monitor July 2017 with PVCs, PACs, and several short runs of SVT. Cardiac cath 09/18/15 with mild CAD (10% mid RCA stenosis, 20% proximal LAD stenosis). Normal LV systolic function. Most recent echo August 2017 with normal LV size and function, no valve disease. Cardizem was added and she has tolerated.    She is here today for follow up. The patient denies any chest pain, dyspnea, palpitations, lower extremity edema, orthopnea, PND, dizziness, near syncope or syncope. Her eye issues are improving with injections. She has been feeling well overall. She has some fatigue.    Primary Care Physician: Crist Infante, MD   Past Medical History:  Diagnosis Date  . Atypical chest pain    a. 08/2015 Cath: LM nl, LAD 20ost, D1 small/nl, D2 mod, nl, RI small, nl, LCX nl, OM1 small, nl, OM2 mod nl, RCA 73m, EF 55-65%.  . Breast cancer ([place])    H/O LEFT MASTECTOMY IN 2000  . Diverticulitis   . Edema   . Esophageal reflux   . H/O: hysterectomy   . Hyperlipidemia   . Hypertension   . Hypokalemia   . Hypothyroidism   . Irregular heartbeat   . Melanoma ([place]) 1991   REMOVED FROM LEFT LEG IN 1991  . Neuropathy   . 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 PAF (paroxysmal atrial fibrillation) ([place])    a. CHA2DS2VASc = 3-->Xarelto since 2015;  b.  08/2015 48h holter in setting of palpitations-->PVC's.  . Palpitations    a. 08/2015 48h Holter: pvc's.  . 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 Post-menopausal   . PVCs (premature ventricular contractions)   . Shingles   . Thyroid cancer ([place])    s/p THYROIDECTOMY  . Whooping cough    as a child    Past Surgical History:  Procedure Laterality Date  .  tarsal tunnel release  2002   Right/Left  tarsal tunnel release  . CARDIAC CATHETERIZATION  04/21/07   GLOBAL ESTIMATED EF IS 60 %; NORMAL LV FUNCTION; NORMAL CORONARY ARTERIES; BORDERLINE INCREASE IN THE AORTIC ROOT  . CARDIAC CATHETERIZATION N/A 09/18/2015   Procedure: Left Heart Cath and Coronary Angiography;  Surgeon: Burnell Blanks, MD;  Location: [facility];  Service: Cardiovascular;  Laterality: N/A;  . COLONOSCOPY    . CYSTOSCOPY  2007   anterior repair of cystocele with mesh  . MASTECTOMY  2000   LEFT BREAST  . MELANOMA EXCISION  1991   REMOVED FROM LEFT LEG  . NASAL SINUS SURGERY    . THYROIDECTOMY      Current Outpatient Medications  Medication Sig Dispense Refill  . acetaminophen (TYLENOL) 500 MG tablet Take 500-1,000 mg by mouth daily as needed for mild pain or headache.    . cetirizine (ZYRTEC) 10 MG tablet Take  by mouth.    . cyclobenzaprine (FLEXERIL) 10 MG tablet Take by mouth.    . digoxin (LANOXIN) 0.125 MG tablet TAKE 1 TABLET BY MOUTH DAILY    . diltiazem (CARDIZEM CD) 120 MG 24 hr capsule TAKE ONE CAPSULE BY MOUTH DAILY 90 capsule 1  . DOCOSAHEXAENOIC ACID PO Take by mouth.    . Ezetimibe (ZETIA PO) Take 10 mg by mouth daily.    . fish oil-omega-3 fatty acids 1000 MG capsule Take 2 g by mouth daily.     . fluticasone (FLONASE) 50 MCG/ACT nasal spray Place 2 sprays into the nose daily.    . fluticasone (FLOVENT DISKUS) 50 MCG/BLIST diskus inhaler Inhale into the lungs.    . Glucosamine-Chondroitin 1500-1200 MG/30ML LIQD Take by mouth.    Marland Kitchen ipratropium (ATROVENT HFA) 17 MCG/ACT inhaler Inhale into  the lungs.    Marland Kitchen ipratropium (ATROVENT) 0.03 % nasal spray Place 2 sprays into the nose 2 (two) times daily as needed for rhinitis.     Marland Kitchen levothyroxine (SYNTHROID, LEVOTHROID) 150 MCG tablet Take 150 mcg by mouth daily before breakfast. 150 mcg Mon - Sat 37.5 mcg on Sunday    . Multiple Vitamins-Minerals (PRESERVISION AREDS) CAPS Take by mouth.    . nebivolol (BYSTOLIC) 5 MG tablet TAKE 1/2 TABLET BY MOUTH DAILY (2.5 MG TOTAL)    . omega-3 acid ethyl esters (LOVAZA) 1 g capsule Take by mouth.    Marland Kitchen omeprazole (PRILOSEC) 20 MG capsule Take 20 mg by mouth daily.    . potassium chloride SA (K-DUR,KLOR-CON) 20 MEQ tablet Take 40 mEq by mouth daily.     . simvastatin (ZOCOR) 40 MG tablet Take 40 mg by mouth at bedtime.      . triamterene-hydrochlorothiazide (MAXZIDE) 75-50 MG per tablet TAKE 1/2 TABLET BY MOUTH DAILY    . XARELTO 20 MG TABS tablet TAKE 1 TABLET BY MOUTH ONCE EVERY EVENING WITH DINNER 30 tablet 6   No current facility-administered medications for this visit.     Allergies  Allergen Reactions  . Amoxicillin-Pot Clavulanate Diarrhea and Nausea Only  . Daypro [Oxaprozin] Hives  . Prednisone Itching and Other (See Comments)    leg swelling /dyuria   . Tegretol [Carbamazepine] Hives    Social History   Socioeconomic History  . Marital status: Married    Spouse name: Not on file  . Number of children: Not on file  . Years of education: Not on file  . Highest education level: Not on file  Occupational History  . Occupation: RETIRED    Fish farm manager: [employer]: [employer]  Social Needs  . Financial resource strain: Not on file  . Food insecurity:    Worry: Not on file    Inability: Not on file  . Transportation needs:    Medical: Not on file    Non-medical: Not on file  Tobacco Use  . Smoking status: Never Smoker  . Smokeless tobacco: Never Used  Substance and Sexual Activity  . Alcohol use: Yes    Comment: RARE  . Drug use: No  . Sexual  activity: Not on file  Lifestyle  . Physical activity:    Days per week: Not on file    Minutes per session: Not on file  . Stress: Not on file  Relationships  . Social connections:    Talks on phone: Not on file    Gets together: Not on file    Attends religious service: Not on  file    Active member of club or organization: Not on file    Attends meetings of clubs or organizations: Not on file    Relationship status: Not on file  . Intimate partner violence:    Fear of current or ex partner: Not on file    Emotionally abused: Not on file    Physically abused: Not on file    Forced sexual activity: Not on file  Other Topics Concern  . Not on file  Social History Narrative   RETIRED FROM [employer]   LIVES @ HOME WITH HER HUSBAND   SHE HAS CO-AUTHORED BOOKS ON IV ADDITIVES   ETOH RARELY   NON-SMOKER          Family History  Problem Relation Age of Onset  . Hypertension Father   . Macular degeneration Father   . Breast cancer Neg Hx     Review of Systems:  As stated in the HPI and otherwise negative.   BP 122/90   Pulse (!) 54   Ht 5\' 9"  (1.753 m)   Wt 161 lb 1.9 oz (73.1 kg)   SpO2 98%   BMI 23.79 kg/m   Physical Examination:  General: Well developed, well nourished, NAD  HEENT: OP clear, mucus membranes moist  SKIN: warm, dry. No rashes. Neuro: No focal deficits  Musculoskeletal: Muscle strength 5/5 all ext  Psychiatric: Mood and affect normal  Neck: No JVD, no carotid bruits, no thyromegaly, no lymphadenopathy.  Lungs:Clear bilaterally, no wheezes, rhonci, crackles Cardiovascular: Regular rate and rhythm. No murmurs, gallops or rubs. Abdomen:Soft. Bowel sounds present. Non-tender.  Extremities: No lower extremity edema. Pulses are 2 + in the bilateral DP/PT.  Echo August 2017: Left ventricle: The cavity size was normal. Wall thickness was   normal. Systolic function was normal. The estimated ejection   fraction was in the range of 55% to 60%.  Wall motion was normal;   there were no regional wall motion abnormalities. Left   ventricular diastolic function parameters were normal. - Atrial septum: No defect or patent foramen ovale was identified.  EKG:  EKG is  ordered today. The ekg ordered today demonstrates Sinus brady, rate 54 bpm  Recent Labs: No results found for requested labs within last 8760 hours.   Lipid Panel No results found for: CHOL, TRIG, HDL, CHOLHDL, VLDL, LDLCALC, LDLDIRECT   Wt Readings from Last 3 Encounters:  12/08/17 161 lb 1.9 oz (73.1 kg)  11/25/16 161 lb 12.8 oz (73.4 kg)  04/09/16 162 lb 12.8 oz (73.8 kg)     Other studies Reviewed: Additional studies/ records that were reviewed today include: . Review of the above records demonstrates:    Assessment and Plan:   1. Atrial fibrillation, paroxysmal/PVCs/SVT: She has had no awareness of palpitations. She is in sinus today. Will continue Cardizem, Bystolic and Xarelto.    2.CAD without angina: Cath July 2017 with mild CAD. Continue statin and beta blocker. No ASA since she is on Xarelto.    Current medicines are reviewed at length with the patient today.  The patient does not have concerns regarding medicines.  The following changes have been made:  no change  Labs/ tests ordered today include:   No orders of the defined types were placed in this encounter.   Disposition:   FU with me in 12  months  Signed, Lauree Chandler, MD 12/08/2017 1:22 PM    [place], [place]  [address] Phone: [phone]; Fax: [phone]

## 2017-12-08 NOTE — Patient Instructions (Signed)

## 2017-12-09 NOTE — Addendum Note (Signed)
Addended by: Jones Broom on: 12/09/2017 08:08 AM   Modules accepted: Orders

## 2017-12-19 DIAGNOSIS — M25551 Pain in right hip: Secondary | ICD-10-CM | POA: Diagnosis not present

## 2017-12-20 DIAGNOSIS — H43812 Vitreous degeneration, left eye: Secondary | ICD-10-CM | POA: Diagnosis not present

## 2017-12-20 DIAGNOSIS — H353211 Exudative age-related macular degeneration, right eye, with active choroidal neovascularization: Secondary | ICD-10-CM | POA: Diagnosis not present

## 2017-12-20 DIAGNOSIS — H353221 Exudative age-related macular degeneration, left eye, with active choroidal neovascularization: Secondary | ICD-10-CM | POA: Diagnosis not present

## 2017-12-22 DIAGNOSIS — H353211 Exudative age-related macular degeneration, right eye, with active choroidal neovascularization: Secondary | ICD-10-CM | POA: Diagnosis not present

## 2017-12-29 DIAGNOSIS — I480 Paroxysmal atrial fibrillation: Secondary | ICD-10-CM | POA: Diagnosis not present

## 2017-12-29 DIAGNOSIS — K219 Gastro-esophageal reflux disease without esophagitis: Secondary | ICD-10-CM | POA: Diagnosis not present

## 2017-12-29 DIAGNOSIS — M859 Disorder of bone density and structure, unspecified: Secondary | ICD-10-CM | POA: Diagnosis not present

## 2017-12-29 DIAGNOSIS — F418 Other specified anxiety disorders: Secondary | ICD-10-CM | POA: Diagnosis not present

## 2017-12-29 DIAGNOSIS — S0990XD Unspecified injury of head, subsequent encounter: Secondary | ICD-10-CM | POA: Diagnosis not present

## 2018-01-17 DIAGNOSIS — H353221 Exudative age-related macular degeneration, left eye, with active choroidal neovascularization: Secondary | ICD-10-CM | POA: Diagnosis not present

## 2018-01-17 DIAGNOSIS — H353211 Exudative age-related macular degeneration, right eye, with active choroidal neovascularization: Secondary | ICD-10-CM | POA: Diagnosis not present

## 2018-01-18 DIAGNOSIS — H353211 Exudative age-related macular degeneration, right eye, with active choroidal neovascularization: Secondary | ICD-10-CM | POA: Diagnosis not present

## 2018-01-30 DIAGNOSIS — M25551 Pain in right hip: Secondary | ICD-10-CM | POA: Diagnosis not present

## 2018-01-31 DIAGNOSIS — Z6824 Body mass index (BMI) 24.0-24.9, adult: Secondary | ICD-10-CM | POA: Diagnosis not present

## 2018-01-31 DIAGNOSIS — R413 Other amnesia: Secondary | ICD-10-CM | POA: Diagnosis not present

## 2018-01-31 DIAGNOSIS — F418 Other specified anxiety disorders: Secondary | ICD-10-CM | POA: Diagnosis not present

## 2018-01-31 DIAGNOSIS — I10 Essential (primary) hypertension: Secondary | ICD-10-CM | POA: Diagnosis not present

## 2018-02-07 ENCOUNTER — Ambulatory Visit: Payer: Medicare Other | Admitting: Sports Medicine

## 2018-02-07 ENCOUNTER — Encounter: Payer: Self-pay | Admitting: Sports Medicine

## 2018-02-07 DIAGNOSIS — M79675 Pain in left toe(s): Secondary | ICD-10-CM | POA: Diagnosis not present

## 2018-02-07 DIAGNOSIS — S90415D Abrasion, left lesser toe(s), subsequent encounter: Secondary | ICD-10-CM | POA: Diagnosis not present

## 2018-02-07 DIAGNOSIS — G629 Polyneuropathy, unspecified: Secondary | ICD-10-CM

## 2018-02-07 DIAGNOSIS — I739 Peripheral vascular disease, unspecified: Secondary | ICD-10-CM

## 2018-02-07 DIAGNOSIS — H353211 Exudative age-related macular degeneration, right eye, with active choroidal neovascularization: Secondary | ICD-10-CM | POA: Diagnosis not present

## 2018-02-07 DIAGNOSIS — H353221 Exudative age-related macular degeneration, left eye, with active choroidal neovascularization: Secondary | ICD-10-CM | POA: Diagnosis not present

## 2018-02-07 NOTE — Progress Notes (Signed)
Subjective: Christina [name] is a 77 y.o. female patient seen in office for evaluation of abrasion of the Left 1st toe. Patient has a history of PVD on Xarelto and a history of neuropathy.  Patient reports that she has some new pain that is on the left second toe however patient reports that everything has remained healed at her left first toe.  Reports that there is still some dark bloody areas to the first toe but otherwise doing good with no problems or issues.  Patient denies any pain to the toe or any difficulty with wearing closed in shoes.  Patient denies nausea/fever/vomiting/chills/night sweats/shortness of breath/pain.  No other issues noted.  Patient Active Problem List   Diagnosis Date Noted  . Cervical spondylosis without myelopathy 08/23/2017  . Meningioma ([place]) 08/23/2017  . 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 Chronic seasonal allergic rhinitis 05/07/2016  . Tinnitus, bilateral 05/07/2016  . ETD (Eustachian tube dysfunction), bilateral 02/05/2016  . Otorrhagia of left ear 02/05/2016  . Atypical chest pain   . PAF (paroxysmal atrial fibrillation) ([place])   . Precordial pain   . Palpitations 09/16/2015  . Paroxysmal atrial fibrillation ([place]) 08/03/2010  . Hypertension 08/03/2010  . History of breast cancer 08/03/2010  . History of thyroid cancer 08/03/2010  . History of melanoma 08/03/2010  . 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 acetaminophen (TYLENOL) 500 MG tablet Take 500-1,000 mg by mouth daily as needed for mild pain or headache.    . cetirizine (ZYRTEC) 10 MG tablet Take by mouth.    . cyclobenzaprine (FLEXERIL) 10 MG tablet Take by mouth.    . digoxin (LANOXIN) 0.125 MG tablet TAKE 1 TABLET BY MOUTH DAILY    . diltiazem (CARDIZEM CD) 120 MG 24 hr capsule TAKE ONE CAPSULE BY MOUTH DAILY 90 capsule 1  . DOCOSAHEXAENOIC ACID PO Take by mouth.    .  Ezetimibe (ZETIA PO) Take 10 mg by mouth daily.    . fish oil-omega-3 fatty acids 1000 MG capsule Take 2 g by mouth daily.     . fluticasone (FLONASE) 50 MCG/ACT nasal spray Place 2 sprays into the nose daily.    . fluticasone (FLOVENT DISKUS) 50 MCG/BLIST diskus inhaler Inhale into the lungs.    . Glucosamine-Chondroitin 1500-1200 MG/30ML LIQD Take by mouth.    Marland Kitchen ipratropium (ATROVENT HFA) 17 MCG/ACT inhaler Inhale into the lungs.    Marland Kitchen ipratropium (ATROVENT) 0.03 % nasal spray Place 2 sprays into the nose 2 (two) times daily as needed for rhinitis.     Marland Kitchen levothyroxine (SYNTHROID, LEVOTHROID) 150 MCG tablet Take 150 mcg by mouth daily before breakfast. 150 mcg Mon - Sat 37.5 mcg on Sunday    . Multiple Vitamins-Minerals (PRESERVISION AREDS) CAPS Take by mouth.    . nebivolol (BYSTOLIC) 5 MG tablet TAKE 1/2 TABLET BY MOUTH DAILY (2.5 MG TOTAL)    . omega-3 acid ethyl esters (LOVAZA) 1 g capsule Take by mouth.    Marland Kitchen omeprazole (PRILOSEC) 20 MG capsule Take 20 mg by mouth daily.    . potassium chloride SA (K-DUR,KLOR-CON) 20 MEQ tablet Take 40 mEq by mouth daily.     . simvastatin (ZOCOR) 40 MG tablet Take 40 mg by mouth at bedtime.      . triamterene-hydrochlorothiazide (MAXZIDE) 75-50 MG per tablet TAKE 1/2 TABLET BY MOUTH DAILY    . XARELTO 20 MG TABS tablet TAKE 1  TABLET BY MOUTH ONCE EVERY EVENING WITH DINNER 30 tablet 6   No current facility-administered medications on file prior to visit.    Allergies  Allergen Reactions  . Amoxicillin-Pot Clavulanate Diarrhea and Nausea Only  . Daypro [Oxaprozin] Hives  . Prednisone Itching and Other (See Comments)    leg swelling /dyuria   . Tegretol [Carbamazepine] Hives    No results found for this or any previous visit (from the past 2160 hour(s)).  Objective: There were no vitals filed for this visit.  General: Patient is awake, alert, oriented x 3 and in no acute distress.  Dermatology: Left first toe remains healed there is dried blood  at the distal tuft there is also new abrasion or dried blood at the left second toe likely from patient toenails and toes rubbing in shoes relating pressure.  Left hallux nail is thick and mycotic.  No acute signs of infection.   Vascular: Dorsalis Pedis pulse =  1/4 Bilateral,  Posterior Tibial pulse = 1/4 Bilateral,  Capillary Fill Time < 5 seconds  Neurologic: Protective sensation absent to the level of lower 1/3 of leg using  the 5.07/10g [employer].  Musculosketal: No Pain with palpation to to left great toe however there is mild pain to the left second toe at the area of new abrasion.  No results for input(s): GRAMSTAIN, LABORGA in the last 8760 hours.  Assessment and Plan:  Problem List Items Addressed This Visit    None    Visit Diagnoses    Abrasion of toe of left foot, subsequent encounter    -  Primary   Pain of toe of left foot       PVD (peripheral vascular disease) ([place])       Neuropathy         -Examined patient  -Mechanically debrided left second toenail and callus skin at first and second toe using sterile chisel blade and advised patient that her hematoma or dry blood will slowly grow out over time and to avoid shoes that rub or irritate the toes because of her neuropathy likely she is getting rubbing in shoes and not realizing it -Patient to return to office in 3 months for final toe check or sooner if problems arise.  Landis Martins, DPM

## 2018-02-28 ENCOUNTER — Ambulatory Visit (INDEPENDENT_AMBULATORY_CARE_PROVIDER_SITE_OTHER): Payer: Medicare Other | Admitting: Sports Medicine

## 2018-02-28 ENCOUNTER — Encounter: Payer: Self-pay | Admitting: Sports Medicine

## 2018-02-28 DIAGNOSIS — I739 Peripheral vascular disease, unspecified: Secondary | ICD-10-CM

## 2018-02-28 DIAGNOSIS — M79675 Pain in left toe(s): Secondary | ICD-10-CM | POA: Diagnosis not present

## 2018-02-28 DIAGNOSIS — G629 Polyneuropathy, unspecified: Secondary | ICD-10-CM | POA: Diagnosis not present

## 2018-02-28 DIAGNOSIS — S90415D Abrasion, left lesser toe(s), subsequent encounter: Secondary | ICD-10-CM | POA: Diagnosis not present

## 2018-02-28 NOTE — Progress Notes (Signed)
Subjective: CACIE GASKINS is a 78 y.o. female patient seen in office for evaluation of abrasion of the Left 1st and second toe. Patient has a history of PVD on Xarelto and a history of neuropathy.  Patient reports that her toes are doing fine has a little soreness on the second toe but otherwise she feels like they are doing better.  Patient denies any recurrence of opening or active bleeding.  Patient denies nausea/fever/vomiting/chills/night sweats/shortness of breath/pain.  No other issues noted.  Patient Active Problem List   Diagnosis Date Noted  . Cervical spondylosis without myelopathy 08/23/2017  . Meningioma ([place]) 08/23/2017  . 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 Chronic seasonal allergic rhinitis 05/07/2016  . Tinnitus, bilateral 05/07/2016  . ETD (Eustachian tube dysfunction), bilateral 02/05/2016  . Otorrhagia of left ear 02/05/2016  . Atypical chest pain   . PAF (paroxysmal atrial fibrillation) ([place])   . Precordial pain   . Palpitations 09/16/2015  . Paroxysmal atrial fibrillation ([place]) 08/03/2010  . Hypertension 08/03/2010  . History of breast cancer 08/03/2010  . History of thyroid cancer 08/03/2010  . History of melanoma 08/03/2010  . 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 acetaminophen (TYLENOL) 500 MG tablet Take 500-1,000 mg by mouth daily as needed for mild pain or headache.    . cetirizine (ZYRTEC) 10 MG tablet Take by mouth.    . cyclobenzaprine (FLEXERIL) 10 MG tablet Take by mouth.    . digoxin (LANOXIN) 0.125 MG tablet TAKE 1 TABLET BY MOUTH DAILY    . diltiazem (CARDIZEM CD) 120 MG 24 hr capsule TAKE ONE CAPSULE BY MOUTH DAILY 90 capsule 1  . DOCOSAHEXAENOIC ACID PO Take by mouth.    . Ezetimibe (ZETIA PO) Take 10 mg by mouth daily.    . fish oil-omega-3 fatty acids 1000 MG capsule Take 2 g by mouth daily.     . fluticasone (FLONASE)  50 MCG/ACT nasal spray Place 2 sprays into the nose daily.    . fluticasone (FLOVENT DISKUS) 50 MCG/BLIST diskus inhaler Inhale into the lungs.    . Glucosamine-Chondroitin 1500-1200 MG/30ML LIQD Take by mouth.    Marland Kitchen ipratropium (ATROVENT HFA) 17 MCG/ACT inhaler Inhale into the lungs.    Marland Kitchen ipratropium (ATROVENT) 0.03 % nasal spray Place 2 sprays into the nose 2 (two) times daily as needed for rhinitis.     Marland Kitchen levothyroxine (SYNTHROID, LEVOTHROID) 150 MCG tablet Take 150 mcg by mouth daily before breakfast. 150 mcg Mon - Sat 37.5 mcg on Sunday    . Multiple Vitamins-Minerals (PRESERVISION AREDS) CAPS Take by mouth.    . nebivolol (BYSTOLIC) 5 MG tablet TAKE 1/2 TABLET BY MOUTH DAILY (2.5 MG TOTAL)    . omega-3 acid ethyl esters (LOVAZA) 1 g capsule Take by mouth.    Marland Kitchen omeprazole (PRILOSEC) 20 MG capsule Take 20 mg by mouth daily.    . potassium chloride SA (K-DUR,KLOR-CON) 20 MEQ tablet Take 40 mEq by mouth daily.     . simvastatin (ZOCOR) 40 MG tablet Take 40 mg by mouth at bedtime.      . triamterene-hydrochlorothiazide (MAXZIDE) 75-50 MG per tablet TAKE 1/2 TABLET BY MOUTH DAILY    . XARELTO 20 MG TABS tablet TAKE 1 TABLET BY MOUTH ONCE EVERY EVENING WITH DINNER 30 tablet 6   No current facility-administered medications on file prior to visit.    Allergies  Allergen Reactions  .  Amoxicillin-Pot Clavulanate Diarrhea and Nausea Only  . Daypro [Oxaprozin] Hives  . Prednisone Itching and Other (See Comments)    leg swelling /dyuria   . Tegretol [Carbamazepine] Hives    No results found for this or any previous visit (from the past 2160 hour(s)).  Objective: There were no vitals filed for this visit.  General: Patient is awake, alert, oriented x 3 and in no acute distress.  Dermatology: Left first toe remains healed there is dried blood at the distal tuft there is also  dried blood at the left second toe likely from pressure due to rubbing since patient has neuropathy.  Left hallux and  second toenail  is thick and mycotic.  No acute signs of infection.   Vascular: Dorsalis Pedis pulse =  1/4 Bilateral,  Posterior Tibial pulse = 1/4 Bilateral,  Capillary Fill Time < 5 seconds  Neurologic: Protective sensation absent to the level of lower 1/3 of leg using  the 5.07/10g [employer].  Musculosketal: No Pain with palpation to to left great toe however there is mild at medial aspect of distal tuft of left second toe  No results for input(s): GRAMSTAIN, LABORGA in the last 8760 hours.  Assessment and Plan:  Problem List Items Addressed This Visit    None    Visit Diagnoses    Abrasion of toe of left foot, subsequent encounter    -  Primary   Pain of toe of left foot       Neuropathy       PVD (peripheral vascular disease) ([place])         -Examined patient  -Mechanically debrided left second toenail and callus skin at first and second toe using sterile chisel blade and advised patient that her hematoma or dry blood will slowly grow out over time and to avoid shoes that rub or irritate the toes because of her neuropathy likely she is getting rubbing in shoes and not realizing it like before and advised patient that these areas are pre-ulcerative in nature and must be careful to prevent re-ulceration -Patient to return to office in 4 months for final toe check or sooner if problems arise.  Landis Martins, DPM

## 2018-03-01 DIAGNOSIS — S76011A Strain of muscle, fascia and tendon of right hip, initial encounter: Secondary | ICD-10-CM | POA: Diagnosis not present

## 2018-03-01 DIAGNOSIS — D329 Benign neoplasm of meninges, unspecified: Secondary | ICD-10-CM | POA: Diagnosis not present

## 2018-03-06 DIAGNOSIS — H353221 Exudative age-related macular degeneration, left eye, with active choroidal neovascularization: Secondary | ICD-10-CM | POA: Diagnosis not present

## 2018-03-07 DIAGNOSIS — H353211 Exudative age-related macular degeneration, right eye, with active choroidal neovascularization: Secondary | ICD-10-CM | POA: Diagnosis not present

## 2018-03-29 DIAGNOSIS — Z85828 Personal history of other malignant neoplasm of skin: Secondary | ICD-10-CM | POA: Diagnosis not present

## 2018-03-29 DIAGNOSIS — L819 Disorder of pigmentation, unspecified: Secondary | ICD-10-CM | POA: Diagnosis not present

## 2018-03-29 DIAGNOSIS — D2261 Melanocytic nevi of right upper limb, including shoulder: Secondary | ICD-10-CM | POA: Diagnosis not present

## 2018-03-29 DIAGNOSIS — Z85820 Personal history of malignant melanoma of skin: Secondary | ICD-10-CM | POA: Diagnosis not present

## 2018-04-10 DIAGNOSIS — H353211 Exudative age-related macular degeneration, right eye, with active choroidal neovascularization: Secondary | ICD-10-CM | POA: Diagnosis not present

## 2018-04-21 DIAGNOSIS — M25561 Pain in right knee: Secondary | ICD-10-CM | POA: Diagnosis not present

## 2018-05-02 DIAGNOSIS — H353221 Exudative age-related macular degeneration, left eye, with active choroidal neovascularization: Secondary | ICD-10-CM | POA: Diagnosis not present

## 2018-05-15 DIAGNOSIS — N329 Bladder disorder, unspecified: Secondary | ICD-10-CM | POA: Diagnosis not present

## 2018-05-15 DIAGNOSIS — K5900 Constipation, unspecified: Secondary | ICD-10-CM | POA: Diagnosis not present

## 2018-05-18 DIAGNOSIS — N815 Vaginal enterocele: Secondary | ICD-10-CM | POA: Diagnosis not present

## 2018-05-18 DIAGNOSIS — N952 Postmenopausal atrophic vaginitis: Secondary | ICD-10-CM | POA: Diagnosis not present

## 2018-05-24 DIAGNOSIS — N815 Vaginal enterocele: Secondary | ICD-10-CM | POA: Diagnosis not present

## 2018-06-27 ENCOUNTER — Ambulatory Visit: Payer: Medicare Other | Admitting: Sports Medicine

## 2018-06-27 DIAGNOSIS — H353211 Exudative age-related macular degeneration, right eye, with active choroidal neovascularization: Secondary | ICD-10-CM | POA: Diagnosis not present

## 2018-06-27 DIAGNOSIS — H353221 Exudative age-related macular degeneration, left eye, with active choroidal neovascularization: Secondary | ICD-10-CM | POA: Diagnosis not present

## 2018-07-18 DIAGNOSIS — Z4689 Encounter for fitting and adjustment of other specified devices: Secondary | ICD-10-CM | POA: Diagnosis not present

## 2018-07-18 DIAGNOSIS — N815 Vaginal enterocele: Secondary | ICD-10-CM | POA: Diagnosis not present

## 2018-07-20 DIAGNOSIS — H5211 Myopia, right eye: Secondary | ICD-10-CM | POA: Diagnosis not present

## 2018-07-24 DIAGNOSIS — E7849 Other hyperlipidemia: Secondary | ICD-10-CM | POA: Diagnosis not present

## 2018-07-24 DIAGNOSIS — M859 Disorder of bone density and structure, unspecified: Secondary | ICD-10-CM | POA: Diagnosis not present

## 2018-07-24 DIAGNOSIS — I10 Essential (primary) hypertension: Secondary | ICD-10-CM | POA: Diagnosis not present

## 2018-07-24 DIAGNOSIS — Z0000 Encounter for general adult medical examination without abnormal findings: Secondary | ICD-10-CM | POA: Insufficient documentation

## 2018-07-24 DIAGNOSIS — C73 Malignant neoplasm of thyroid gland: Secondary | ICD-10-CM | POA: Diagnosis not present

## 2018-07-25 DIAGNOSIS — I10 Essential (primary) hypertension: Secondary | ICD-10-CM | POA: Diagnosis not present

## 2018-07-25 DIAGNOSIS — R82998 Other abnormal findings in urine: Secondary | ICD-10-CM | POA: Diagnosis not present

## 2018-08-01 DIAGNOSIS — Z0000 Encounter for general adult medical examination without abnormal findings: Secondary | ICD-10-CM | POA: Diagnosis not present

## 2018-08-01 DIAGNOSIS — H3530 Unspecified macular degeneration: Secondary | ICD-10-CM | POA: Diagnosis not present

## 2018-08-01 DIAGNOSIS — I480 Paroxysmal atrial fibrillation: Secondary | ICD-10-CM | POA: Diagnosis not present

## 2018-08-01 DIAGNOSIS — R413 Other amnesia: Secondary | ICD-10-CM | POA: Diagnosis not present

## 2018-08-08 DIAGNOSIS — H353211 Exudative age-related macular degeneration, right eye, with active choroidal neovascularization: Secondary | ICD-10-CM | POA: Diagnosis not present

## 2018-08-16 ENCOUNTER — Other Ambulatory Visit: Payer: Self-pay | Admitting: Cardiovascular Disease

## 2018-08-16 NOTE — Telephone Encounter (Signed)
Prescription refill request for Xarelto received.   Last office visit: Dr. Angelena Form (12-08-2017) Weight:73.1kg  (12-08-2017) Age: 78 y.o. Scr: 1.0 mg/dl  ([facility] 07-24-2018) CrCl: 54 ml/min  Prescription refill request sent.

## 2018-08-23 ENCOUNTER — Other Ambulatory Visit: Payer: Self-pay | Admitting: Internal Medicine

## 2018-08-23 DIAGNOSIS — Z1231 Encounter for screening mammogram for malignant neoplasm of breast: Secondary | ICD-10-CM

## 2018-09-26 DIAGNOSIS — Z85820 Personal history of malignant melanoma of skin: Secondary | ICD-10-CM | POA: Diagnosis not present

## 2018-09-26 DIAGNOSIS — H353221 Exudative age-related macular degeneration, left eye, with active choroidal neovascularization: Secondary | ICD-10-CM | POA: Diagnosis not present

## 2018-09-26 DIAGNOSIS — Z85828 Personal history of other malignant neoplasm of skin: Secondary | ICD-10-CM | POA: Diagnosis not present

## 2018-09-26 DIAGNOSIS — L820 Inflamed seborrheic keratosis: Secondary | ICD-10-CM | POA: Diagnosis not present

## 2018-09-26 DIAGNOSIS — H353211 Exudative age-related macular degeneration, right eye, with active choroidal neovascularization: Secondary | ICD-10-CM | POA: Diagnosis not present

## 2018-10-03 ENCOUNTER — Other Ambulatory Visit: Payer: Self-pay

## 2018-10-03 ENCOUNTER — Ambulatory Visit: Payer: Medicare Other | Admitting: Sports Medicine

## 2018-10-03 ENCOUNTER — Encounter: Payer: Self-pay | Admitting: Sports Medicine

## 2018-10-03 VITALS — Temp 97.1°F

## 2018-10-03 DIAGNOSIS — I739 Peripheral vascular disease, unspecified: Secondary | ICD-10-CM

## 2018-10-03 DIAGNOSIS — L84 Corns and callosities: Secondary | ICD-10-CM

## 2018-10-03 DIAGNOSIS — G629 Polyneuropathy, unspecified: Secondary | ICD-10-CM | POA: Diagnosis not present

## 2018-10-03 NOTE — Progress Notes (Signed)
Subjective: Christina [name] is a 78 y.o. female patient who presents to office for evaluation of Left foot pain secondary to callus skin. Patient complains of pain at the lesion present Left foot at the ball and noticing some blood. Patient has tried pumice stone with no relief in symptoms. Patient denies any other pedal complaints. Patient is still on Xarelto like before with no changes in medications.  Patient Active Problem List   Diagnosis Date Noted  . Cervical spondylosis without myelopathy 08/23/2017  . Meningioma ([place]) 08/23/2017  . 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 Chronic seasonal allergic rhinitis 05/07/2016  . Tinnitus, bilateral 05/07/2016  . ETD (Eustachian tube dysfunction), bilateral 02/05/2016  . Otorrhagia of left ear 02/05/2016  . Atypical chest pain   . PAF (paroxysmal atrial fibrillation) ([place])   . Precordial pain   . Palpitations 09/16/2015  . Paroxysmal atrial fibrillation ([place]) 08/03/2010  . Hypertension 08/03/2010  . History of breast cancer 08/03/2010  . History of thyroid cancer 08/03/2010  . History of melanoma 08/03/2010  . Tarsal tunnel syndrome 08/03/2010    Current Outpatient Medications on File Prior to Visit  Medication Sig Dispense Refill  . cetirizine (ZYRTEC) 10 MG tablet Take by mouth.    . diltiazem (CARDIZEM CD) 120 MG 24 hr capsule TAKE ONE CAPSULE BY MOUTH DAILY 90 capsule 1  . Ezetimibe (ZETIA PO) Take 10 mg by mouth daily.    . fish oil-omega-3 fatty acids 1000 MG capsule Take 2 g by mouth daily.     . fluticasone (FLONASE) 50 MCG/ACT nasal spray Place 2 sprays into the nose daily.    . fluticasone (FLOVENT DISKUS) 50 MCG/BLIST diskus inhaler Inhale into the lungs.    . Glucosamine-Chondroitin 1500-1200 MG/30ML LIQD Take by mouth.    Marland Kitchen ipratropium (ATROVENT HFA) 17 MCG/ACT inhaler Inhale into the lungs.    Marland Kitchen ipratropium (ATROVENT) 0.03 % nasal spray Place 2 sprays into the nose 2 (two) times daily as  needed for rhinitis.     Marland Kitchen levothyroxine (SYNTHROID, LEVOTHROID) 150 MCG tablet Take 150 mcg by mouth daily before breakfast. 150 mcg Mon - Sat 37.5 mcg on Sunday    . Multiple Vitamins-Minerals (PRESERVISION AREDS) CAPS Take by mouth.    . nebivolol (BYSTOLIC) 5 MG tablet TAKE 1/2 TABLET BY MOUTH DAILY (2.5 MG TOTAL)    . omega-3 acid ethyl esters (LOVAZA) 1 g capsule Take by mouth.    Marland Kitchen omeprazole (PRILOSEC) 20 MG capsule Take 20 mg by mouth daily.    . potassium chloride SA (K-DUR,KLOR-CON) 20 MEQ tablet Take 40 mEq by mouth daily.     . simvastatin (ZOCOR) 40 MG tablet Take 40 mg by mouth at bedtime.      . triamterene-hydrochlorothiazide (MAXZIDE) 75-50 MG per tablet TAKE 1/2 TABLET BY MOUTH DAILY    . XARELTO 20 MG TABS tablet TAKE 1 TABLET BY MOUTH ONCE EVERY EVENING WITH DINNER 30 tablet 6  . acetaminophen (TYLENOL) 500 MG tablet Take 500-1,000 mg by mouth daily as needed for mild pain or headache.    . cyclobenzaprine (FLEXERIL) 10 MG tablet Take by mouth.    . digoxin (LANOXIN) 0.125 MG tablet TAKE 1 TABLET BY MOUTH DAILY    . DOCOSAHEXAENOIC ACID PO Take by mouth.     No current facility-administered medications on file prior to visit.     Allergies  Allergen Reactions  . Carbamazepine Hives, Other (See Comments) and Rash  . Oxaprozin Hives, Other (See  Comments) and Rash  . Amoxicillin-Pot Clavulanate Diarrhea and Nausea Only  . Prednisolone   . Prednisone Itching and Other (See Comments)    leg swelling /dyuria     Objective:  General: Alert and oriented x3 in no acute distress  Dermatology: Keratotic lesion present sub met 5 bilateral L>R with dry heme and skin lines transversing the lesions, pain is present with direct pressure to the lesion with a central nucleated core noted, no webspace macerations, no ecchymosis bilateral, all nails x 10 are short and thick but well manicured.  Vascular: Dorsalis Pedis 1/4 and Posterior Tibial pedal pulses 0/4, Capillary Fill Time  3 seconds, + pedal hair growth bilateral, 1+ pitting edema bilateral lower extremities, Temperature gradient within normal limits.  Neurology: Protective sensation absent secondary to history of neuropathy.  Musculoskeletal: Mild tenderness with palpation at the keratotic lesion site on L>R, Muscular strength 5/5 in all groups without pain or limitation on range of motion.Hammertoe and prominent 5th met head bilateral L>R.   Assessment and Plan: Problem List Items Addressed This Visit    None    Visit Diagnoses    Pre-ulcerative calluses    -  Primary   Neuropathy       PVD (peripheral vascular disease) ([place])          -Complete examination performed -Discussed treatment options -Parred keratoic lesion using a chisel blade x2; treated the area on left with offloading pad and antibiotic cream and advised patient to do the same -Advised good supportive shoes and inserts and advised patient if she is doing better may benefit from custom orthotics. If area is worsening may benefit from met head resection  -Patient to return to office in 3-4 weeks or sooner if condition worsens.  Landis Martins, DPM

## 2018-10-04 ENCOUNTER — Ambulatory Visit
Admission: RE | Admit: 2018-10-04 | Discharge: 2018-10-04 | Disposition: A | Discharge status: Home / Self Care | Payer: Medicare Other | Source: Ambulatory Visit | Attending: Internal Medicine | Admitting: Internal Medicine

## 2018-10-04 DIAGNOSIS — Z1231 Encounter for screening mammogram for malignant neoplasm of breast: Secondary | ICD-10-CM

## 2018-10-09 ENCOUNTER — Telehealth: Payer: Self-pay | Admitting: Cardiovascular Disease

## 2018-10-09 NOTE — Telephone Encounter (Signed)
I spoke with pt. She is calling to schedule yearly October follow up. States she does not need sooner appt.  I scheduled pt to see Dr Angelena Form on October 21,2020 at 9:40

## 2018-10-09 NOTE — Telephone Encounter (Signed)
New message:    Patient calling needing a sooner appt than 12/20 that is what her recall says. Please call patient for a sooner appt.

## 2018-10-25 ENCOUNTER — Ambulatory Visit: Payer: Medicare Other | Admitting: Obstetrics and Gynecology

## 2018-10-31 ENCOUNTER — Encounter: Payer: Self-pay | Admitting: Sports Medicine

## 2018-10-31 ENCOUNTER — Other Ambulatory Visit: Payer: Self-pay

## 2018-10-31 ENCOUNTER — Ambulatory Visit: Payer: Medicare Other | Admitting: Sports Medicine

## 2018-10-31 DIAGNOSIS — G629 Polyneuropathy, unspecified: Secondary | ICD-10-CM | POA: Diagnosis not present

## 2018-10-31 DIAGNOSIS — I739 Peripheral vascular disease, unspecified: Secondary | ICD-10-CM

## 2018-10-31 DIAGNOSIS — L84 Corns and callosities: Secondary | ICD-10-CM

## 2018-10-31 NOTE — Progress Notes (Signed)
Subjective: Christina [name] is a 78 y.o. female patient who presents to office for evaluation of Left foot pain secondary to callus skin. Patient admits that the callus is sore, 8/10 and that area is solid and hard. Patient continues to use stone and padding with no relief in symptoms. Patient denies any other pedal complaints. Patient is still on Xarelto like before with no changes in medications.  Patient Active Problem List   Diagnosis Date Noted  . Cervical spondylosis without myelopathy 08/23/2017  . Meningioma ([place]) 08/23/2017  . 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 Chronic seasonal allergic rhinitis 05/07/2016  . Tinnitus, bilateral 05/07/2016  . ETD (Eustachian tube dysfunction), bilateral 02/05/2016  . Otorrhagia of left ear 02/05/2016  . Atypical chest pain   . PAF (paroxysmal atrial fibrillation) ([place])   . Precordial pain   . Palpitations 09/16/2015  . Paroxysmal atrial fibrillation ([place]) 08/03/2010  . Hypertension 08/03/2010  . History of breast cancer 08/03/2010  . History of thyroid cancer 08/03/2010  . History of melanoma 08/03/2010  . Tarsal tunnel syndrome 08/03/2010    Current Outpatient Medications on File Prior to Visit  Medication Sig Dispense Refill  . acetaminophen (TYLENOL) 500 MG tablet Take 500-1,000 mg by mouth daily as needed for mild pain or headache.    . cetirizine (ZYRTEC) 10 MG tablet Take by mouth.    . cyclobenzaprine (FLEXERIL) 10 MG tablet Take by mouth.    . digoxin (LANOXIN) 0.125 MG tablet TAKE 1 TABLET BY MOUTH DAILY    . diltiazem (CARDIZEM CD) 120 MG 24 hr capsule TAKE ONE CAPSULE BY MOUTH DAILY 90 capsule 1  . DOCOSAHEXAENOIC ACID PO Take by mouth.    . Ezetimibe (ZETIA PO) Take 10 mg by mouth daily.    . fish oil-omega-3 fatty acids 1000 MG capsule Take 2 g by mouth daily.     . fluticasone (FLONASE) 50 MCG/ACT nasal spray Place 2 sprays into the nose daily.    . fluticasone (FLOVENT DISKUS) 50  MCG/BLIST diskus inhaler Inhale into the lungs.    . Glucosamine-Chondroitin 1500-1200 MG/30ML LIQD Take by mouth.    Marland Kitchen ipratropium (ATROVENT HFA) 17 MCG/ACT inhaler Inhale into the lungs.    Marland Kitchen ipratropium (ATROVENT) 0.03 % nasal spray Place 2 sprays into the nose 2 (two) times daily as needed for rhinitis.     Marland Kitchen levothyroxine (SYNTHROID, LEVOTHROID) 150 MCG tablet Take 150 mcg by mouth daily before breakfast. 150 mcg Mon - Sat 37.5 mcg on Sunday    . Multiple Vitamins-Minerals (PRESERVISION AREDS) CAPS Take by mouth.    . nebivolol (BYSTOLIC) 5 MG tablet TAKE 1/2 TABLET BY MOUTH DAILY (2.5 MG TOTAL)    . omega-3 acid ethyl esters (LOVAZA) 1 g capsule Take by mouth.    Marland Kitchen omeprazole (PRILOSEC) 20 MG capsule Take 20 mg by mouth daily.    . potassium chloride SA (K-DUR,KLOR-CON) 20 MEQ tablet Take 40 mEq by mouth daily.     . simvastatin (ZOCOR) 40 MG tablet Take 40 mg by mouth at bedtime.      . triamterene-hydrochlorothiazide (MAXZIDE) 75-50 MG per tablet TAKE 1/2 TABLET BY MOUTH DAILY    . XARELTO 20 MG TABS tablet TAKE 1 TABLET BY MOUTH ONCE EVERY EVENING WITH DINNER 30 tablet 6   No current facility-administered medications on file prior to visit.     Allergies  Allergen Reactions  . Carbamazepine Hives, Other (See Comments) and Rash  . Oxaprozin Hives, Other (See  Comments) and Rash  . Amoxicillin-Pot Clavulanate Diarrhea and Nausea Only  . Prednisolone   . Prednisone Itching and Other (See Comments)    leg swelling /dyuria     Objective:  General: Alert and oriented x3 in no acute distress  Dermatology: Keratotic lesion present sub met 5 bilateral L>R with dry heme and skin lines transversing the lesions, pain is present with direct pressure to the lesion with a central nucleated core noted, no webspace macerations, no ecchymosis bilateral, all nails x 10 are short and thick but well manicured.  Vascular: Dorsalis Pedis 1/4 and Posterior Tibial pedal pulses 0/4, Capillary Fill  Time 3 seconds, + pedal hair growth bilateral, 1+ pitting edema bilateral lower extremities, Temperature gradient within normal limits.  Neurology: Protective sensation absent secondary to history of neuropathy.  Musculoskeletal: Mild tenderness with palpation at the keratotic lesion site on left only today, Muscular strength 5/5 in all groups without pain or limitation on range of motion.Hammertoe and prominent 5th met head bilateral L>R.   Assessment and Plan: Problem List Items Addressed This Visit    None    Visit Diagnoses    Pre-ulcerative calluses    -  Primary   Neuropathy       PVD (peripheral vascular disease) ([place])          -Complete examination performed -Re-Discussed treatment options -Parred keratoic lesion using a chisel blade sub met 5 on left at no charge -Advised good supportive shoes  -Patient to see Liliane Channel for custom orthotics to offload callus areas -Patient to return to office for orthotics or sooner if condition worsens.  Landis Martins, DPM

## 2018-11-14 DIAGNOSIS — H353211 Exudative age-related macular degeneration, right eye, with active choroidal neovascularization: Secondary | ICD-10-CM | POA: Diagnosis not present

## 2018-11-22 DIAGNOSIS — H9190 Unspecified hearing loss, unspecified ear: Secondary | ICD-10-CM | POA: Diagnosis not present

## 2018-11-22 DIAGNOSIS — N329 Bladder disorder, unspecified: Secondary | ICD-10-CM | POA: Diagnosis not present

## 2018-11-22 DIAGNOSIS — R413 Other amnesia: Secondary | ICD-10-CM | POA: Diagnosis not present

## 2018-11-22 DIAGNOSIS — D320 Benign neoplasm of cerebral meninges: Secondary | ICD-10-CM | POA: Diagnosis not present

## 2018-12-12 NOTE — Progress Notes (Signed)
Chief Complaint  Patient presents with  . Follow-up    PAF    History of Present Illness: 78 yo female with history of paroxysmal atrial fibrillation, SVT, PVCs, HTN, breast cancer and mild CAD who is here for follow up. She has been known to have paroxysmal atrial fibrillation for years and was started on Xarelto in 2015. She was seen in the ED 11/22/14 with palpitations, nausea and SOB and was found to be in atrial fibrillation with RVR. She converted to sinus in the ED spontaneously. She reported more frequent episodes of palpitations in July 2017 with associated weakness and chest pain. 48 hour monitor July 2017 with PVCs, PACs, and several short runs of SVT. Cardiac cath 09/18/15 with mild CAD (10% mid RCA stenosis, 20% proximal LAD stenosis). Normal LV systolic function. Most recent echo August 2017 with normal LV size and function, no valve disease. Cardizem was added and she has tolerated.    She is here today for follow up. The patient denies any chest pain, dyspnea, palpitations, lower extremity edema, orthopnea, PND, dizziness, near syncope or syncope.    Primary Care Physician: Crist Infante, MD   Past Medical History:  Diagnosis Date  . Atypical chest pain    a. 08/2015 Cath: LM nl, LAD 20ost, D1 small/nl, D2 mod, nl, RI small, nl, LCX nl, OM1 small, nl, OM2 mod nl, RCA 53m, EF 55-65%.  . Breast cancer ([place])    H/O LEFT MASTECTOMY IN 2000  . Diverticulitis   . Edema   . Esophageal reflux   . H/O: hysterectomy   . Hyperlipidemia   . Hypertension   . Hypokalemia   . Hypothyroidism   . Irregular heartbeat   . Melanoma ([place]) 1991   REMOVED FROM LEFT LEG IN 1991  . Neuropathy   . 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 PAF (paroxysmal atrial fibrillation) ([place])    a. CHA2DS2VASc = 3-->Xarelto since 2015;  b. 08/2015 48h holter in setting of palpitations-->PVC's.  . Palpitations    a. 08/2015 48h Holter: pvc's.  . 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 Post-menopausal   . PVCs  (premature ventricular contractions)   . Shingles   . Thyroid cancer ([place])    s/p THYROIDECTOMY  . Whooping cough    as a child    Past Surgical History:  Procedure Laterality Date  .  tarsal tunnel release  2002   Right/Left  tarsal tunnel release  . CARDIAC CATHETERIZATION  04/21/07   GLOBAL ESTIMATED EF IS 60 %; NORMAL LV FUNCTION; NORMAL CORONARY ARTERIES; BORDERLINE INCREASE IN THE AORTIC ROOT  . CARDIAC CATHETERIZATION N/A 09/18/2015   Procedure: Left Heart Cath and Coronary Angiography;  Surgeon: Burnell Blanks, MD;  Location: [facility];  Service: Cardiovascular;  Laterality: N/A;  . COLONOSCOPY    . CYSTOSCOPY  2007   anterior repair of cystocele with mesh  . MASTECTOMY  2000   LEFT BREAST  . MELANOMA EXCISION  1991   REMOVED FROM LEFT LEG  . NASAL SINUS SURGERY    . THYROIDECTOMY      Current Outpatient Medications  Medication Sig Dispense Refill  . acetaminophen (TYLENOL) 500 MG tablet Take 500-1,000 mg by mouth daily as needed for mild pain or headache.    . Calcium Citrate (CITRACAL PO) Take 2 tablets by mouth 2 (two) times daily.    . cetirizine (ZYRTEC) 10 MG tablet Take by mouth.    . diltiazem (CARDIZEM CD) 120  MG 24 hr capsule TAKE ONE CAPSULE BY MOUTH DAILY 90 capsule 1  . Ezetimibe (ZETIA PO) Take 10 mg by mouth daily.    . fish oil-omega-3 fatty acids 1000 MG capsule Take 2 g by mouth daily.     . fluticasone (FLONASE) 50 MCG/ACT nasal spray Place 2 sprays into the nose daily.    . fluticasone (FLOVENT DISKUS) 50 MCG/BLIST diskus inhaler Inhale into the lungs.    Marland Kitchen galantamine (RAZADYNE ER) 8 MG 24 hr capsule Take 8 mg by mouth daily with breakfast.    . Glucosamine-Chondroitin 1500-1200 MG/30ML LIQD Take by mouth.    Marland Kitchen ipratropium (ATROVENT HFA) 17 MCG/ACT inhaler Inhale into the lungs.    Marland Kitchen ipratropium (ATROVENT) 0.03 % nasal spray Place 2 sprays into the nose 2 (two) times daily as needed for rhinitis.     Marland Kitchen levothyroxine (SYNTHROID,  LEVOTHROID) 150 MCG tablet Take 150 mcg by mouth daily before breakfast. 150 mcg Mon - Sat 37.5 mcg on Sunday    . Multiple Vitamins-Minerals (PRESERVISION AREDS) CAPS Take by mouth.    . nebivolol (BYSTOLIC) 5 MG tablet TAKE 1/2 TABLET BY MOUTH DAILY (2.5 MG TOTAL)    . omeprazole (PRILOSEC) 20 MG capsule Take 20 mg by mouth daily.    . potassium chloride SA (K-DUR,KLOR-CON) 20 MEQ tablet Take 40 mEq by mouth daily.     . simvastatin (ZOCOR) 40 MG tablet Take 40 mg by mouth at bedtime.      . triamterene-hydrochlorothiazide (MAXZIDE) 75-50 MG per tablet TAKE 1/2 TABLET BY MOUTH DAILY    . XARELTO 20 MG TABS tablet TAKE 1 TABLET BY MOUTH ONCE EVERY EVENING WITH DINNER 30 tablet 6  . furosemide (LASIX) 20 MG tablet Take one tablet by mouth as needed.  Take as directed. 30 tablet 3   No current facility-administered medications for this visit.     Allergies  Allergen Reactions  . Carbamazepine Hives, Other (See Comments) and Rash  . Oxaprozin Hives, Other (See Comments) and Rash  . Amoxicillin-Pot Clavulanate Diarrhea and Nausea Only  . Prednisolone   . Prednisone Itching and Other (See Comments)    leg swelling /dyuria     Social History   Socioeconomic History  . Marital status: Married    Spouse name: Not on file  . Number of children: Not on file  . Years of education: Not on file  . Highest education level: Not on file  Occupational History  . Occupation: RETIRED    Fish farm manager: [place]: [employer]  Social Needs  . Financial resource strain: Not on file  . Food insecurity    Worry: Not on file    Inability: Not on file  . Transportation needs    Medical: Not on file    Non-medical: Not on file  Tobacco Use  . Smoking status: Never Smoker  . Smokeless tobacco: Never Used  Substance and Sexual Activity  . Alcohol use: Yes    Comment: RARE  . Drug use: No  . Sexual activity: Not on file  Lifestyle  . Physical activity    Days per week: Not  on file    Minutes per session: Not on file  . Stress: Not on file  Relationships  . Social Herbalist on phone: Not on file    Gets together: Not on file    Attends religious service: Not on file    Active member of club or  organization: Not on file    Attends meetings of clubs or organizations: Not on file    Relationship status: Not on file  . Intimate partner violence    Fear of current or ex partner: Not on file    Emotionally abused: Not on file    Physically abused: Not on file    Forced sexual activity: Not on file  Other Topics Concern  . Not on file  Social History Narrative   RETIRED FROM [employer]   LIVES @ HOME WITH HER HUSBAND   SHE HAS CO-AUTHORED BOOKS ON IV ADDITIVES   ETOH RARELY   NON-SMOKER          Family History  Problem Relation Age of Onset  . Hypertension Father   . Macular degeneration Father   . Breast cancer Neg Hx     Review of Systems:  As stated in the HPI and otherwise negative.   BP 118/72   Pulse 60   Ht 5\' 9"  (1.753 m)   Wt 154 lb 9.6 oz (70.1 kg)   SpO2 98%   BMI 22.83 kg/m   Physical Examination:  General: Well developed, well nourished, NAD  HEENT: OP clear, mucus membranes moist  SKIN: warm, dry. No rashes. Neuro: No focal deficits  Musculoskeletal: Muscle strength 5/5 all ext  Psychiatric: Mood and affect normal  Neck: No JVD, no carotid bruits, no thyromegaly, no lymphadenopathy.  Lungs:Clear bilaterally, no wheezes, rhonci, crackles Cardiovascular: Regular rate and rhythm. No murmurs, gallops or rubs. Abdomen:Soft. Bowel sounds present. Non-tender.  Extremities: No lower extremity edema. Pulses are 2 + in the bilateral DP/PT.  Echo August 2017: Left ventricle: The cavity size was normal. Wall thickness was   normal. Systolic function was normal. The estimated ejection   fraction was in the range of 55% to 60%. Wall motion was normal;   there were no regional wall motion abnormalities. Left    ventricular diastolic function parameters were normal. - Atrial septum: No defect or patent foramen ovale was identified.  EKG:  EKG is ordered today. The ekg ordered today demonstrates NSR, rate 60 bpm.   Recent Labs: No results found for requested labs within last 8760 hours.   Lipid Panel No results found for: CHOL, TRIG, HDL, CHOLHDL, VLDL, LDLCALC, LDLDIRECT   Wt Readings from Last 3 Encounters:  12/13/18 154 lb 9.6 oz (70.1 kg)  12/08/17 161 lb 1.9 oz (73.1 kg)  11/25/16 161 lb 12.8 oz (73.4 kg)     Other studies Reviewed: Additional studies/ records that were reviewed today include: . Review of the above records demonstrates:    Assessment and Plan:   1. Atrial fibrillation, paroxysmal/PVCs/SVT: No palpitations. Sinus today. Continue Cardizem, Digoxin, Bystolic and Xarelto.     2.CAD without angina: Cath July 2017 with mild CAD. She has no chest pain. Will continue beta blocker and statin. No ASA since she is on Xarelto.    3. Chronic diastolic CHF: Will give her lasix 20 mg to use as needed.   Current medicines are reviewed at length with the patient today.  The patient does not have concerns regarding medicines.  The following changes have been made:  no change  Labs/ tests ordered today include:   Orders Placed This Encounter  Procedures  . EKG 12-Lead    Disposition:   FU with me in 12  months  Signed, Lauree Chandler, MD 12/13/2018 10:21 AM    [facility]  [address], [place], [place]  [address] Phone: [phone]; Fax: [phone]

## 2018-12-13 ENCOUNTER — Other Ambulatory Visit: Payer: Self-pay

## 2018-12-13 ENCOUNTER — Encounter: Payer: Self-pay | Admitting: Cardiovascular Disease

## 2018-12-13 ENCOUNTER — Ambulatory Visit: Payer: Medicare Other | Admitting: Cardiovascular Disease

## 2018-12-13 VITALS — BP 118/72 | HR 60 | Ht 69.0 in | Wt 154.6 lb

## 2018-12-13 DIAGNOSIS — I2510 Atherosclerotic heart disease of native coronary artery without angina pectoris: Secondary | ICD-10-CM

## 2018-12-13 DIAGNOSIS — I480 Paroxysmal atrial fibrillation: Secondary | ICD-10-CM | POA: Diagnosis not present

## 2018-12-13 DIAGNOSIS — I5032 Chronic diastolic (congestive) heart failure: Secondary | ICD-10-CM | POA: Diagnosis not present

## 2018-12-13 MED ORDER — FUROSEMIDE 20 MG PO TABS: ORAL_TABLET | ORAL | 3 refills | Status: DC

## 2018-12-13 NOTE — Patient Instructions (Signed)
Medication Instructions:  Your physician has recommended you make the following change in your medication:  1.) lasix 20 mg by mouth as needed   *If you need a refill on your cardiac medications before your next appointment, please call your pharmacy*  Lab Work: none If you have labs (blood work) drawn today and your tests are completely normal, you will receive your results only by: Marland Kitchen MyChart Message (if you have MyChart) OR . A paper copy in the mail If you have any lab test that is abnormal or we need to change your treatment, we will call you to review the results.  Testing/Procedures: none  Follow-Up: At [facility], you and your health needs are our priority.  As part of our continuing mission to provide you with exceptional heart care, we have created designated Provider Care Teams.  These Care Teams include your primary Cardiologist (physician) and Advanced Practice Providers (APPs -  Physician Assistants and Nurse Practitioners) who all work together to provide you with the care you need, when you need it.  Your next appointment:   12 months  The format for your next appointment:   In Person  Provider:   Lauree Chandler, MD  Other Instructions

## 2018-12-19 DIAGNOSIS — H353211 Exudative age-related macular degeneration, right eye, with active choroidal neovascularization: Secondary | ICD-10-CM | POA: Diagnosis not present

## 2018-12-19 DIAGNOSIS — H353221 Exudative age-related macular degeneration, left eye, with active choroidal neovascularization: Secondary | ICD-10-CM | POA: Diagnosis not present

## 2019-01-26 ENCOUNTER — Ambulatory Visit: Payer: Medicare Other | Admitting: Cardiovascular Disease

## 2019-01-30 DIAGNOSIS — H353211 Exudative age-related macular degeneration, right eye, with active choroidal neovascularization: Secondary | ICD-10-CM | POA: Diagnosis not present

## 2019-02-02 DIAGNOSIS — D320 Benign neoplasm of cerebral meninges: Secondary | ICD-10-CM | POA: Diagnosis not present

## 2019-02-02 DIAGNOSIS — S0990XD Unspecified injury of head, subsequent encounter: Secondary | ICD-10-CM | POA: Diagnosis not present

## 2019-02-02 DIAGNOSIS — N329 Bladder disorder, unspecified: Secondary | ICD-10-CM | POA: Diagnosis not present

## 2019-02-02 DIAGNOSIS — F418 Other specified anxiety disorders: Secondary | ICD-10-CM | POA: Diagnosis not present

## 2019-02-13 DIAGNOSIS — E038 Other specified hypothyroidism: Secondary | ICD-10-CM | POA: Diagnosis not present

## 2019-02-13 DIAGNOSIS — E7849 Other hyperlipidemia: Secondary | ICD-10-CM | POA: Diagnosis not present

## 2019-02-26 ENCOUNTER — Other Ambulatory Visit: Payer: Self-pay | Admitting: Cardiovascular Disease

## 2019-02-26 NOTE — Telephone Encounter (Signed)
Pt last saw Dr Angelena Form 12/13/18, last labs 02/14/19 Creat 1.1 at [facility] per [facility], age 79, weight 70.1kg, CrCl 47.4, based on CrCl pt is not on appropriate dosage of Xarelto.  With CrCl 15-50 pt should be on Xarelto 15mg  QD.  Will forward a staff message to Dr Angelena Form to see if a dosage change is appropriate. Will await his response.

## 2019-02-27 MED ORDER — RIVAROXABAN 15 MG PO TABS: 15.0000 mg | ORAL_TABLET | Freq: Every day | ORAL | 6 refills | Status: DC

## 2019-02-27 NOTE — Telephone Encounter (Signed)
-----   Message from Burnell Blanks, MD sent at 02/26/2019  4:33 PM EST ----- Ok with me to change to Xarelto 15 mg daily. Thanks, chris ----- Message ----- From: Brynda Peon, RN Sent: 02/26/2019   2:31 PM EST To: Burnell Blanks, MD  Pt last saw Dr Angelena Form 12/13/18, last labs 02/14/19 Creat 1.1 at [facility] per [facility], age 79, weight 70.1kg, CrCl 47.4, based on CrCl pt is not on appropriate dosage of Xarelto.  With CrCl 15-50 pt should be on Xarelto 15mg  QD.  Will forward a staff message to Dr Angelena Form to see if a dosage change is appropriate. Please advise.  Thanks.

## 2019-02-27 NOTE — Telephone Encounter (Signed)
Called spoke with pt, advised of Xarelto dosage change from 20mg  QD to mg QD.  Pt verbalized understanding and thanked me for letting her know.  Will send in new rx for Xarelto 15mg  QD.

## 2019-03-13 DIAGNOSIS — H353221 Exudative age-related macular degeneration, left eye, with active choroidal neovascularization: Secondary | ICD-10-CM | POA: Diagnosis not present

## 2019-03-13 DIAGNOSIS — H353211 Exudative age-related macular degeneration, right eye, with active choroidal neovascularization: Secondary | ICD-10-CM | POA: Diagnosis not present

## 2019-03-14 ENCOUNTER — Ambulatory Visit: Payer: Medicare Other | Attending: Internal Medicine

## 2019-03-14 DIAGNOSIS — Z23 Encounter for immunization: Secondary | ICD-10-CM | POA: Insufficient documentation

## 2019-03-14 NOTE — Progress Notes (Signed)
   Covid-19 Vaccination Clinic  Name:  Christina [name]    MRN: [id number] DOB: 07-22-1940  03/14/2019  Christina [name] was observed post Covid-19 immunization for 15 minutes without incidence. She was provided with Vaccine Information Sheet and instruction to access the V-Safe system.   Christina [name] was instructed to call 911 with any severe reactions post vaccine: Marland Kitchen Difficulty breathing  . Swelling of your face and throat  . A fast heartbeat  . A bad rash all over your body  . Dizziness and weakness    Immunizations Administered    Name Date Dose VIS Date Route   Pfizer COVID-19 Vaccine 03/14/2019  1:43 PM 0.3 mL 02/02/2019 Intramuscular   Manufacturer: [place]   Lot: [id number]   [place]: [id number]

## 2019-04-01 ENCOUNTER — Ambulatory Visit: Payer: Medicare Other | Attending: Internal Medicine

## 2019-04-01 DIAGNOSIS — Z23 Encounter for immunization: Secondary | ICD-10-CM | POA: Insufficient documentation

## 2019-04-01 NOTE — Progress Notes (Signed)
   Covid-19 Vaccination Clinic  Name:  Christina [name]    MRN: [id number] DOB: 11/18/1940  04/01/2019  [name] was observed post Covid-19 immunization for 15 minutes without incidence. She was provided with Vaccine Information Sheet and instruction to access the V-Safe system.   [name] was instructed to call 911 with any severe reactions post vaccine: Marland Kitchen Difficulty breathing  . Swelling of your face and throat  . A fast heartbeat  . A bad rash all over your body  . Dizziness and weakness    Immunizations Administered    Name Date Dose VIS Date Route   Pfizer COVID-19 Vaccine 04/01/2019  8:10 AM 0.3 mL 02/02/2019 Intramuscular   Manufacturer: [place]   Lot: [id number]   [place]: [id number]

## 2019-04-04 DIAGNOSIS — R159 Full incontinence of feces: Secondary | ICD-10-CM | POA: Insufficient documentation

## 2019-04-19 DIAGNOSIS — D6859 Other primary thrombophilia: Secondary | ICD-10-CM | POA: Insufficient documentation

## 2019-04-24 DIAGNOSIS — H353211 Exudative age-related macular degeneration, right eye, with active choroidal neovascularization: Secondary | ICD-10-CM | POA: Diagnosis not present

## 2019-05-07 DIAGNOSIS — D6869 Other thrombophilia: Secondary | ICD-10-CM | POA: Diagnosis not present

## 2019-05-07 DIAGNOSIS — N329 Bladder disorder, unspecified: Secondary | ICD-10-CM | POA: Diagnosis not present

## 2019-05-07 DIAGNOSIS — R159 Full incontinence of feces: Secondary | ICD-10-CM | POA: Diagnosis not present

## 2019-05-07 DIAGNOSIS — M5432 Sciatica, left side: Secondary | ICD-10-CM | POA: Insufficient documentation

## 2019-05-07 DIAGNOSIS — D320 Benign neoplasm of cerebral meninges: Secondary | ICD-10-CM | POA: Diagnosis not present

## 2019-05-15 ENCOUNTER — Encounter: Payer: Self-pay | Admitting: Sports Medicine

## 2019-05-15 ENCOUNTER — Ambulatory Visit: Payer: Medicare Other | Admitting: Sports Medicine

## 2019-05-15 ENCOUNTER — Other Ambulatory Visit: Payer: Self-pay

## 2019-05-15 ENCOUNTER — Ambulatory Visit (INDEPENDENT_AMBULATORY_CARE_PROVIDER_SITE_OTHER): Payer: Medicare Other

## 2019-05-15 ENCOUNTER — Other Ambulatory Visit: Payer: Self-pay | Admitting: Sports Medicine

## 2019-05-15 VITALS — Temp 97.3°F

## 2019-05-15 DIAGNOSIS — L02612 Cutaneous abscess of left foot: Secondary | ICD-10-CM | POA: Diagnosis not present

## 2019-05-15 DIAGNOSIS — L97522 Non-pressure chronic ulcer of other part of left foot with fat layer exposed: Secondary | ICD-10-CM

## 2019-05-15 DIAGNOSIS — L03032 Cellulitis of left toe: Secondary | ICD-10-CM | POA: Diagnosis not present

## 2019-05-15 DIAGNOSIS — I739 Peripheral vascular disease, unspecified: Secondary | ICD-10-CM

## 2019-05-15 DIAGNOSIS — G629 Polyneuropathy, unspecified: Secondary | ICD-10-CM

## 2019-05-15 DIAGNOSIS — L84 Corns and callosities: Secondary | ICD-10-CM

## 2019-05-15 MED ORDER — CLINDAMYCIN HCL 300 MG PO CAPS: 300.0000 mg | ORAL_CAPSULE | Freq: Three times a day (TID) | ORAL | 0 refills | Status: DC

## 2019-05-15 NOTE — Progress Notes (Signed)
Subjective: Christina [name] is a 79 y.o. female patient who presents to office for evaluation of Left foot. Patient reports that her husband has been changing the dressing on her left foot because she started to notice some bleeding around the callus about 4 months ago reports that over time the callus area has been coming more sore and has been having to wrap it every day and trying to keep it clean and filed down as needed.  Patient denies nausea vomiting fever chills but does admit to some increased pain and swelling to the left foot.  No other pedal complaints noted.  Patient Active Problem List   Diagnosis Date Noted  . Cervical spondylosis without myelopathy 08/23/2017  . Meningioma ([place]) 08/23/2017  . 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 Chronic seasonal allergic rhinitis 05/07/2016  . Tinnitus, bilateral 05/07/2016  . ETD (Eustachian tube dysfunction), bilateral 02/05/2016  . Otorrhagia of left ear 02/05/2016  . Atypical chest pain   . PAF (paroxysmal atrial fibrillation) ([place])   . Precordial pain   . Palpitations 09/16/2015  . Paroxysmal atrial fibrillation ([place]) 08/03/2010  . Hypertension 08/03/2010  . History of breast cancer 08/03/2010  . History of thyroid cancer 08/03/2010  . History of melanoma 08/03/2010  . Tarsal tunnel syndrome 08/03/2010    Current Outpatient Medications on File Prior to Visit  Medication Sig Dispense Refill  . diltiazem (TIAZAC) 120 MG 24 hr capsule Take by mouth.    Marland Kitchen acetaminophen (TYLENOL) 500 MG tablet Take 500-1,000 mg by mouth daily as needed for mild pain or headache.    . Calcium Citrate (CITRACAL PO) Take 2 tablets by mouth 2 (two) times daily.    . cetirizine (ZYRTEC) 10 MG tablet Take by mouth.    . diltiazem (CARDIZEM CD) 120 MG 24 hr capsule TAKE ONE CAPSULE BY MOUTH DAILY 90 capsule 1  . ezetimibe (ZETIA) 10 MG tablet Take 10 mg by mouth daily.    . fish oil-omega-3 fatty acids 1000 MG capsule Take 2  g by mouth daily.     . fluticasone (FLONASE) 50 MCG/ACT nasal spray Place 2 sprays into the nose daily.    . fluticasone (FLOVENT DISKUS) 50 MCG/BLIST diskus inhaler Inhale into the lungs.    Marland Kitchen galantamine (RAZADYNE ER) 16 MG 24 hr capsule     . Glucosamine-Chondroitin 1500-1200 MG/30ML LIQD Take by mouth.    Marland Kitchen ipratropium (ATROVENT HFA) 17 MCG/ACT inhaler Inhale into the lungs.    Marland Kitchen ipratropium (ATROVENT) 0.03 % nasal spray Place 2 sprays into the nose 2 (two) times daily as needed for rhinitis.     Marland Kitchen levothyroxine (SYNTHROID, LEVOTHROID) 150 MCG tablet Take 150 mcg by mouth daily before breakfast. 150 mcg Mon - Sat 37.5 mcg on Sunday    . Misc Natural Products (GLUCOSAMINE CHOND CMP TRIPLE) TABS Take by mouth.    . Multiple Vitamins-Minerals (PRESERVISION AREDS) CAPS Take by mouth.    . nebivolol (BYSTOLIC) 5 MG tablet TAKE 1/2 TABLET BY MOUTH DAILY (2.5 MG TOTAL)    . omeprazole (PRILOSEC) 20 MG capsule Take 20 mg by mouth daily.    . potassium chloride SA (K-DUR,KLOR-CON) 20 MEQ tablet Take 40 mEq by mouth daily.     . Rivaroxaban (XARELTO) 15 MG TABS tablet Take 1 tablet (15 mg total) by mouth daily with supper. 30 tablet 6  . simvastatin (ZOCOR) 40 MG tablet Take 40 mg by mouth at bedtime.      . triamterene-hydrochlorothiazide (  MAXZIDE-25) 37.5-25 MG tablet Take 1 tablet by mouth daily.     No current facility-administered medications on file prior to visit.    Allergies  Allergen Reactions  . Carbamazepine Hives, Other (See Comments) and Rash  . Oxaprozin Hives, Other (See Comments) and Rash  . Amoxicillin-Pot Clavulanate Diarrhea and Nausea Only  . Prednisolone   . Prednisone Itching and Other (See Comments)    leg swelling /dyuria     Objective:  General: Alert and oriented x3 in no acute distress  Dermatology: Keratotic lesion present sub met 4-5 on the left foot once the area was debrided there was a full thickness ulceration present that measured 1.5 x 1.5 cm with a  granular base and significant malodor clear to bloody drainage mild swelling no erythema, all nails x 10 are short and thick but well manicured.     Vascular: Dorsalis Pedis 1/4 and Posterior Tibial pedal pulses 0/4, Capillary Fill Time 3 seconds, + pedal hair growth bilateral, 1+ pitting edema bilateral lower extremities, Temperature gradient within normal limits.  Neurology: Protective sensation absent secondary to history of neuropathy.  Musculoskeletal: Mild tenderness with palpation at the now ulcerated callus on the bottom of the left foot.  Muscular strength 5/5 in all groups without pain or limitation on range of motion.Hammertoe and prominent 5th met head bilateral L>R.   X-rays show no acute osseous finding no gas in soft tissues  Assessment and Plan: Problem List Items Addressed This Visit    None    Visit Diagnoses    Cellulitis and abscess of toe of left foot    -  Primary   Relevant Orders   WOUND CULTURE   Pre-ulcerative calluses       Relevant Orders   DG Foot Complete Left   WOUND CULTURE   Foot ulcer, left, with fat layer exposed ([place])       Relevant Orders   WOUND CULTURE   Neuropathy       PVD (peripheral vascular disease) ([facility])       Relevant Medications   diltiazem (TIAZAC) 120 MG 24 hr capsule   ezetimibe (ZETIA) 10 MG tablet   triamterene-hydrochlorothiazide (MAXZIDE-25) 37.5-25 MG tablet     -Complete examination performed -Re-Discussed treatment options -X-rays reviewed - Excisionally dedbrided ulceration at left plantar forefoot to healthy bleeding borders removing nonviable tissue using a sterile chisel blade. Wound measures post debridement as above. Wound was debrided to the level of the dermis with viable wound base exposed to promote healing. Hemostasis was achieved with manuel pressure. Patient tolerated procedure well without any discomfort or anesthesia necessary for this wound debridement.  -Wound culture obtained -Started patient on oral  clindamycin until culture results are available -Applied Iodosorb and dry sterile dressing and instructed patient to continue with daily dressings at home consisting of the same daily- Advised patient to go to the ER or return to office if the wound worsens or if constitutional symptoms are present. -Return to office in 2 weeks for wound care.  Landis Martins, DPM

## 2019-05-15 NOTE — Patient Instructions (Signed)
Cleanse wound with saline wound wash or soapy water (dial soap and water in a spray bottle), pat dry and then apply idosorb cream and guaze dressing once daily

## 2019-05-16 ENCOUNTER — Telehealth: Payer: Self-pay | Admitting: *Deleted

## 2019-05-16 MED ORDER — CADEXOMER IODINE 0.9 % EX GEL: 1.0000 "application " | Freq: Every day | CUTANEOUS | 2 refills | Status: DC | PRN

## 2019-05-16 NOTE — Telephone Encounter (Signed)
Left message informing pt's husband, Richardson Landry pt was given a tube and the ones called in could be refills.

## 2019-05-16 NOTE — Telephone Encounter (Signed)
Pt husband, Christina [name] states Dr. Cannon Kettle wanted pt to clean wound with saline spray and apply Iodosorb. Iodosorb is $123.00/tube and they need a prescription.

## 2019-05-16 NOTE — Telephone Encounter (Signed)
Richardson Landry called and I informed pt's husband, Richardson Landry of Dr. Leeanne Rio statement of 3:09pm.

## 2019-05-16 NOTE — Telephone Encounter (Signed)
I informed pt's husband, Richardson Landry the Iodosorb had been ordered at [facility].

## 2019-05-16 NOTE — Telephone Encounter (Signed)
I gave her a tube of the medication when she was in office on yesterday to get her started -Dr. Chauncey Cruel

## 2019-05-17 ENCOUNTER — Encounter: Payer: Self-pay | Admitting: Gastroenterology

## 2019-05-18 LAB — WOUND CULTURE
MICRO NUMBER:: 10284440
SPECIMEN QUALITY:: ADEQUATE

## 2019-05-18 LAB — HOUSE ACCOUNT TRACKING

## 2019-06-05 ENCOUNTER — Other Ambulatory Visit: Payer: Self-pay

## 2019-06-05 ENCOUNTER — Ambulatory Visit: Payer: Medicare Other | Admitting: Sports Medicine

## 2019-06-05 ENCOUNTER — Encounter: Payer: Self-pay | Admitting: Sports Medicine

## 2019-06-05 DIAGNOSIS — I739 Peripheral vascular disease, unspecified: Secondary | ICD-10-CM

## 2019-06-05 DIAGNOSIS — G629 Polyneuropathy, unspecified: Secondary | ICD-10-CM

## 2019-06-05 DIAGNOSIS — L02612 Cutaneous abscess of left foot: Secondary | ICD-10-CM

## 2019-06-05 DIAGNOSIS — L84 Corns and callosities: Secondary | ICD-10-CM

## 2019-06-05 DIAGNOSIS — L97521 Non-pressure chronic ulcer of other part of left foot limited to breakdown of skin: Secondary | ICD-10-CM | POA: Diagnosis not present

## 2019-06-05 DIAGNOSIS — L03032 Cellulitis of left toe: Secondary | ICD-10-CM

## 2019-06-05 DIAGNOSIS — B351 Tinea unguium: Secondary | ICD-10-CM

## 2019-06-05 DIAGNOSIS — M79609 Pain in unspecified limb: Secondary | ICD-10-CM

## 2019-06-05 DIAGNOSIS — H353211 Exudative age-related macular degeneration, right eye, with active choroidal neovascularization: Secondary | ICD-10-CM | POA: Diagnosis not present

## 2019-06-05 NOTE — Progress Notes (Signed)
Subjective: Christina [name] is a 79 y.o. female patient who returns to office for follow up evaluation of Left foot ulceration, reports that her husband has been helping to change the dressing and it has been doing better no pain but did have an episode of bleeding twice. Patient also wants to know why her left 1st and 2nd toenails are so thick. Denies constitutional symptoms or any other pedal complaints at this time.  Patient Active Problem List   Diagnosis Date Noted  . Cervical spondylosis without myelopathy 08/23/2017  . Meningioma ([place]) 08/23/2017  . 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 Chronic seasonal allergic rhinitis 05/07/2016  . Tinnitus, bilateral 05/07/2016  . ETD (Eustachian tube dysfunction), bilateral 02/05/2016  . Otorrhagia of left ear 02/05/2016  . Atypical chest pain   . PAF (paroxysmal atrial fibrillation) ([place])   . Precordial pain   . Palpitations 09/16/2015  . Paroxysmal atrial fibrillation ([place]) 08/03/2010  . Hypertension 08/03/2010  . History of breast cancer 08/03/2010  . History of thyroid cancer 08/03/2010  . History of melanoma 08/03/2010  . Tarsal tunnel syndrome 08/03/2010    Current Outpatient Medications on File Prior to Visit  Medication Sig Dispense Refill  . acetaminophen (TYLENOL) 500 MG tablet Take 500-1,000 mg by mouth daily as needed for mild pain or headache.    . cadexomer iodine (IODOSORB) 0.9 % gel Apply 1 application topically daily as needed for wound care. 40 g 2  . Calcium Citrate (CITRACAL PO) Take 2 tablets by mouth 2 (two) times daily.    . cetirizine (ZYRTEC) 10 MG tablet Take by mouth.    . clindamycin (CLEOCIN) 300 MG capsule Take 1 capsule (300 mg total) by mouth 3 (three) times daily. 30 capsule 0  . diltiazem (CARDIZEM CD) 120 MG 24 hr capsule TAKE ONE CAPSULE BY MOUTH DAILY 90 capsule 1  . diltiazem (TIAZAC) 120 MG 24 hr capsule Take by mouth.    . ezetimibe (ZETIA) 10 MG tablet Take 10 mg  by mouth daily.    . fish oil-omega-3 fatty acids 1000 MG capsule Take 2 g by mouth daily.     . fluticasone (FLONASE) 50 MCG/ACT nasal spray Place 2 sprays into the nose daily.    . fluticasone (FLOVENT DISKUS) 50 MCG/BLIST diskus inhaler Inhale into the lungs.    Marland Kitchen galantamine (RAZADYNE ER) 16 MG 24 hr capsule     . Glucosamine-Chondroitin 1500-1200 MG/30ML LIQD Take by mouth.    Marland Kitchen ipratropium (ATROVENT HFA) 17 MCG/ACT inhaler Inhale into the lungs.    Marland Kitchen ipratropium (ATROVENT) 0.03 % nasal spray Place 2 sprays into the nose 2 (two) times daily as needed for rhinitis.     Marland Kitchen levothyroxine (SYNTHROID, LEVOTHROID) 150 MCG tablet Take 150 mcg by mouth daily before breakfast. 150 mcg Mon - Sat 37.5 mcg on Sunday    . Misc Natural Products (GLUCOSAMINE CHOND CMP TRIPLE) TABS Take by mouth.    . Multiple Vitamins-Minerals (PRESERVISION AREDS) CAPS Take by mouth.    . nebivolol (BYSTOLIC) 5 MG tablet TAKE 1/2 TABLET BY MOUTH DAILY (2.5 MG TOTAL)    . omeprazole (PRILOSEC) 20 MG capsule Take 20 mg by mouth daily.    . potassium chloride SA (K-DUR,KLOR-CON) 20 MEQ tablet Take 40 mEq by mouth daily.     . Rivaroxaban (XARELTO) 15 MG TABS tablet Take 1 tablet (15 mg total) by mouth daily with supper. 30 tablet 6  . simvastatin (ZOCOR) 40 MG tablet Take  40 mg by mouth at bedtime.      . triamterene-hydrochlorothiazide (MAXZIDE-25) 37.5-25 MG tablet Take 1 tablet by mouth daily.     No current facility-administered medications on file prior to visit.    Allergies  Allergen Reactions  . Carbamazepine Hives, Other (See Comments) and Rash  . Oxaprozin Hives, Other (See Comments) and Rash  . Amoxicillin-Pot Clavulanate Diarrhea and Nausea Only  . Prednisolone   . Prednisone Itching and Other (See Comments)    leg swelling /dyuria     Objective:  General: Alert and oriented x3 in no acute distress  Dermatology: There is a partial thickness ulceration present plantar left foot that measured 0.5 x  0.5 cm with a granular base with minimal bloody drainage mild swelling no erythema, all nails x 10 are thick with left 1st and 2nd toenails most involved.    Vascular: Dorsalis Pedis 1/4 and Posterior Tibial pedal pulses 0/4, Capillary Fill Time 3 seconds, + pedal hair growth bilateral, 1+ pitting edema bilateral lower extremities, Temperature gradient within normal limits.  Neurology: Protective sensation absent secondary to history of neuropathy.  Musculoskeletal: Mild tenderness with palpation at the ulcer left foot, Muscular strength 5/5 in all groups without pain or limitation on range of motion.Hammertoe and prominent 5th met head bilateral L>R.   Assessment and Plan: Problem List Items Addressed This Visit    None    Visit Diagnoses    Foot ulcer, left, limited to breakdown of skin ([place])    -  Primary   Pre-ulcerative calluses       Cellulitis and abscess of toe of left foot       Neuropathy       PVD (peripheral vascular disease) ([place])       Pain due to onychomycosis of nail         -Complete examination performed -Re-Discussed treatment options for left foot ulcer -Excisionally dedbrided ulceration at left plantar forefoot to healthy bleeding borders removing nonviable tissue using a sterile chisel blade. Wound measures post debridement as above. Wound was debrided to the level of the dermis with viable wound base exposed to promote healing. Hemostasis was achieved with manuel pressure. Patient tolerated procedure well without any discomfort or anesthesia necessary for this wound debridement.  -Applied Iodosorb and dry sterile dressing and instructed patient to continue with daily dressings at home consisting of the same daily like before; gave patient a tube of Iodosorb today while she was in office for her husband to use for dressing changes - Advised patient to go to the ER or return to office if the wound worsens or if constitutional symptoms are present. -At no additional  charge debrided left 1st and 2nd toenails using sterile nail nipper and recommended patient to use vicks vapor rub to the nails after filing each night  -Return to office in 3 weeks for wound care.  Landis Martins, DPM

## 2019-06-06 DIAGNOSIS — Z85820 Personal history of malignant melanoma of skin: Secondary | ICD-10-CM | POA: Diagnosis not present

## 2019-06-06 DIAGNOSIS — D2261 Melanocytic nevi of right upper limb, including shoulder: Secondary | ICD-10-CM | POA: Diagnosis not present

## 2019-06-06 DIAGNOSIS — D692 Other nonthrombocytopenic purpura: Secondary | ICD-10-CM | POA: Diagnosis not present

## 2019-06-06 DIAGNOSIS — Z85828 Personal history of other malignant neoplasm of skin: Secondary | ICD-10-CM | POA: Diagnosis not present

## 2019-06-20 DIAGNOSIS — K469 Unspecified abdominal hernia without obstruction or gangrene: Secondary | ICD-10-CM | POA: Diagnosis not present

## 2019-06-20 DIAGNOSIS — N3281 Overactive bladder: Secondary | ICD-10-CM | POA: Diagnosis not present

## 2019-06-20 DIAGNOSIS — R159 Full incontinence of feces: Secondary | ICD-10-CM | POA: Diagnosis not present

## 2019-06-26 ENCOUNTER — Ambulatory Visit: Payer: Medicare Other | Admitting: Sports Medicine

## 2019-06-26 ENCOUNTER — Other Ambulatory Visit: Payer: Self-pay

## 2019-06-26 ENCOUNTER — Encounter: Payer: Self-pay | Admitting: Sports Medicine

## 2019-06-26 VITALS — Temp 97.7°F

## 2019-06-26 DIAGNOSIS — L97521 Non-pressure chronic ulcer of other part of left foot limited to breakdown of skin: Secondary | ICD-10-CM

## 2019-06-26 DIAGNOSIS — M79672 Pain in left foot: Secondary | ICD-10-CM

## 2019-06-26 DIAGNOSIS — I739 Peripheral vascular disease, unspecified: Secondary | ICD-10-CM

## 2019-06-26 DIAGNOSIS — G629 Polyneuropathy, unspecified: Secondary | ICD-10-CM

## 2019-06-26 NOTE — Progress Notes (Signed)
Subjective: Christina [name] is a 79 y.o. female patient who returns to office for follow up evaluation of Left foot ulceration, reports that her husband has been helping to change the dressing and it has been doing better able to walk on it without it bothering her and has not had any episodes of bleeding. Denies constitutional symptoms or any other pedal complaints at this time.  Patient Active Problem List   Diagnosis Date Noted  . Cervical spondylosis without myelopathy 08/23/2017  . Meningioma ([place]) 08/23/2017  . 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 Chronic seasonal allergic rhinitis 05/07/2016  . Tinnitus, bilateral 05/07/2016  . ETD (Eustachian tube dysfunction), bilateral 02/05/2016  . Otorrhagia of left ear 02/05/2016  . Atypical chest pain   . PAF (paroxysmal atrial fibrillation) ([place])   . Precordial pain   . Palpitations 09/16/2015  . Paroxysmal atrial fibrillation ([place]) 08/03/2010  . Hypertension 08/03/2010  . History of breast cancer 08/03/2010  . History of thyroid cancer 08/03/2010  . History of melanoma 08/03/2010  . Tarsal tunnel syndrome 08/03/2010    Current Outpatient Medications on File Prior to Visit  Medication Sig Dispense Refill  . acetaminophen (TYLENOL) 500 MG tablet Take 500-1,000 mg by mouth daily as needed for mild pain or headache.    . cadexomer iodine (IODOSORB) 0.9 % gel Apply 1 application topically daily as needed for wound care. 40 g 2  . Calcium Citrate (CITRACAL PO) Take 2 tablets by mouth 2 (two) times daily.    . cetirizine (ZYRTEC) 10 MG tablet Take by mouth.    . clindamycin (CLEOCIN) 300 MG capsule Take 1 capsule (300 mg total) by mouth 3 (three) times daily. 30 capsule 0  . diltiazem (CARDIZEM CD) 120 MG 24 hr capsule TAKE ONE CAPSULE BY MOUTH DAILY 90 capsule 1  . diltiazem (TIAZAC) 120 MG 24 hr capsule Take by mouth.    . ezetimibe (ZETIA) 10 MG tablet Take 10 mg by mouth daily.    . fish oil-omega-3  fatty acids 1000 MG capsule Take 2 g by mouth daily.     . fluticasone (FLONASE) 50 MCG/ACT nasal spray Place 2 sprays into the nose daily.    . fluticasone (FLOVENT DISKUS) 50 MCG/BLIST diskus inhaler Inhale into the lungs.    Marland Kitchen galantamine (RAZADYNE ER) 16 MG 24 hr capsule     . Glucosamine-Chondroitin 1500-1200 MG/30ML LIQD Take by mouth.    Marland Kitchen ipratropium (ATROVENT HFA) 17 MCG/ACT inhaler Inhale into the lungs.    Marland Kitchen ipratropium (ATROVENT) 0.03 % nasal spray Place 2 sprays into the nose 2 (two) times daily as needed for rhinitis.     Marland Kitchen levothyroxine (SYNTHROID, LEVOTHROID) 150 MCG tablet Take 150 mcg by mouth daily before breakfast. 150 mcg Mon - Sat 37.5 mcg on Sunday    . Misc Natural Products (GLUCOSAMINE CHOND CMP TRIPLE) TABS Take by mouth.    . Multiple Vitamins-Minerals (PRESERVISION AREDS) CAPS Take by mouth.    . nebivolol (BYSTOLIC) 5 MG tablet TAKE 1/2 TABLET BY MOUTH DAILY (2.5 MG TOTAL)    . omeprazole (PRILOSEC) 20 MG capsule Take 20 mg by mouth daily.    . potassium chloride SA (K-DUR,KLOR-CON) 20 MEQ tablet Take 40 mEq by mouth daily.     . Rivaroxaban (XARELTO) 15 MG TABS tablet Take 1 tablet (15 mg total) by mouth daily with supper. 30 tablet 6  . simvastatin (ZOCOR) 20 MG tablet Take 20 mg by mouth at bedtime.    Marland Kitchen  triamterene-hydrochlorothiazide (MAXZIDE-25) 37.5-25 MG tablet Take 1 tablet by mouth daily.     No current facility-administered medications on file prior to visit.    Allergies  Allergen Reactions  . Carbamazepine Hives, Other (See Comments) and Rash  . Oxaprozin Hives, Other (See Comments) and Rash  . Amoxicillin-Pot Clavulanate Diarrhea and Nausea Only  . Prednisolone   . Prednisone Itching and Other (See Comments)    leg swelling /dyuria     Objective:  General: Alert and oriented x3 in no acute distress  Dermatology: There is a partial thickness ulceration present plantar left foot that measured 0.5 x 0.4 cm with keratotic border and a granular  base with minimal bloody drainage mild swelling no erythema, all nails x 10 are short and thick with left 1st and 2nd toenails most involved.    Vascular: Dorsalis Pedis 1/4 and Posterior Tibial pedal pulses 0/4, Capillary Fill Time 3 seconds, + pedal hair growth bilateral, 1+ pitting edema bilateral lower extremities, Temperature gradient within normal limits.  Neurology: Protective sensation absent secondary to history of neuropathy.  Musculoskeletal: Mild tenderness with palpation at the ulcer left foot, Muscular strength 5/5 in all groups without pain or limitation on range of motion.Hammertoe and prominent 5th met head bilateral L>R.   Assessment and Plan: Problem List Items Addressed This Visit    None    Visit Diagnoses    Foot ulcer, left, limited to breakdown of skin ([place])    -  Primary   Neuropathy       PVD (peripheral vascular disease) ([place])       Relevant Medications   simvastatin (ZOCOR) 20 MG tablet   Left foot pain         -Complete examination performed -Re-Discussed treatment options for left foot ulcer again and continued care -Excisionally dedbrided ulceration at left plantar forefoot to healthy bleeding borders removing nonviable tissue using a sterile chisel blade. Wound measures post debridement as above. Wound was debrided to the level of the dermis with viable wound base exposed to promote healing. Hemostasis was achieved with manuel pressure. Patient tolerated procedure well without any discomfort or anesthesia necessary for this wound debridement.  -Applied Iodosorb and dry sterile dressing and instructed patient to continue with daily dressings at home consisting of the same daily like before; gave patient a tube of Iodosorb today while she was in office for her husband to use for dressing changes since she is almost out of previous tube -Applied offloading padding to shoe on left - Advised patient to go to the ER or return to office if the wound worsens or if  constitutional symptoms are present. -Return to office in 3 to 4 weeks for wound care.  Landis Martins, DPM

## 2019-07-03 ENCOUNTER — Ambulatory Visit: Payer: Medicare Other | Admitting: Gastroenterology

## 2019-07-03 ENCOUNTER — Encounter: Payer: Self-pay | Admitting: Gastroenterology

## 2019-07-03 ENCOUNTER — Other Ambulatory Visit: Payer: Self-pay

## 2019-07-03 ENCOUNTER — Telehealth: Payer: Self-pay

## 2019-07-03 VITALS — BP 132/84 | HR 63 | Temp 97.0°F | Ht 68.0 in | Wt 158.1 lb

## 2019-07-03 DIAGNOSIS — R194 Change in bowel habit: Secondary | ICD-10-CM

## 2019-07-03 DIAGNOSIS — R159 Full incontinence of feces: Secondary | ICD-10-CM

## 2019-07-03 DIAGNOSIS — Z7901 Long term (current) use of anticoagulants: Secondary | ICD-10-CM

## 2019-07-03 MED ORDER — SUTAB 1479-225-188 MG PO TABS: 1.0000 | ORAL_TABLET | Freq: Once | ORAL | 0 refills | Status: AC

## 2019-07-03 NOTE — Telephone Encounter (Signed)
   Primary Cardiologist: Lauree Chandler, MD  Chart reviewed as part of pre-operative protocol coverage. Patient was contacted 07/03/2019 in reference to pre-operative risk assessment for pending surgery as outlined below.  Christina [name] was last seen on 12/13/18 by Dr Angelena Form. H/o paroxysmal atrial fibrillation, SVT, PVCs, HTN, breast cancer, SVT, PACs, PVCs, chronic dCHF, and mild CAD by cath 2017. RCRI 0.9% indicating low risk of CV complications. She affirms she's doing well, constantly "on the go," able to complete over 4 METs without cardiac symptoms. Therefore, based on ACC/AHA guidelines, the patient would be at acceptable risk for the planned procedure without further cardiovascular testing.   Will route to pharm for input on Xarelto then plan to send to GI.  Charlie Pitter, PA-C 07/03/2019, 10:00 AM

## 2019-07-03 NOTE — Telephone Encounter (Signed)
Patient with diagnosis of A Fib on Xarelto for anticoagulation.    Procedure: colonscopy Date of procedure: 07/09/19  CHADS2-VASc score of  6 (CHF, HTN, CAD, AGE x 2, female)  CrCl 48 mL/min   Per office protocol, patient can hold Xarelto for 2 days prior to procedure.

## 2019-07-03 NOTE — Telephone Encounter (Signed)
Due to patient's latest GFR of 48, requesting clearance to hold Xarelto for 3 days. Please advise

## 2019-07-03 NOTE — Patient Instructions (Addendum)
If you are age 78 or older, your body mass index should be between 23-30. Your Body mass index is 24.04 kg/m. If this is out of the aforementioned range listed, please consider follow up with your Primary Care Provider.  If you are age 40 or younger, your body mass index should be between 19-25. Your Body mass index is 24.04 kg/m. If this is out of the aformentioned range listed, please consider follow up with your Primary Care Provider.   You have been scheduled for a colonoscopy. Please follow written instructions given to you at your visit today.  Please pick up your prep supplies at the pharmacy within the next 1-3 days. If you use inhalers (even only as needed), please bring them with you on the day of your procedure. Your physician has requested that you go to www.startemmi.com and enter the access code given to you at your visit today. This web site gives a general overview about your procedure. However, you should still follow specific instructions given to you by our office regarding your preparation for the procedure.  You will be contacted by our office prior to your procedure for directions on holding your Xarelto.  If you do not hear from our office 1 week prior to your scheduled procedure, please call [phone] to discuss.   Please bring the Universal SUTAB Codes handout sheet we are giving you today to the pharmacy when picking up your SUTAB bowel prep.  You may have to remind the pharmacy to run the codes  in order to bring the price down to around $40 depending on your insurance.  Thank you for entrusting me with your care and for choosing [facility], Dr. [place] Cellar

## 2019-07-03 NOTE — Progress Notes (Signed)
HPI :  79 year old [name] with a history of atrial fibrillation on Xarelto, history of thyroid cancer, history of melanoma, reported history of diverticulitis, referred by Dr. Hardie Shackleton, MD for altered bowel habits..  Patient states she has had problems with her bowels now for over the past year at least, and getting progressively worse over time.  She has significant urgency and frequency of her bowels that bother her on a daily basis.  She states she may have a few bowel movements within an hour, sense of incomplete evacuation and urgency.  She has had a few episodes of fecal incontinence in light of these issues.  She states she will feel okay for a few hours afterwards and then may have additional episodes throughout the day.  She feels that her bowel habits are now running her life.  She states her stools are sometimes loose, however more often there small balls of stools.  She has used a stool softener in the past which gave her diarrhea and increased frequency and she did not think it helped.  She has had a few episodes of scant bleeding but nothing significant or persistent.  She denies any weight loss.  She has tried some fiber supplements in the past without any benefit.  She denies any abdominal pains but occasional cramping.  She is eating okay.  No new medication changes over the past year that she endorses to be related to this.  She denies any NSAID use.  She denies any family history of IBD, colon cancer, or celiac disease.  She takes low-dose omeprazole 20 mg once a day for reflux and states it works pretty well to control her symptoms.  She has been on this for a while.  Her last colonoscopy was in 2014 which did not show any abnormalities, good prep noted, per Dr. Oletta Lamas.  No polyps.  Labs from December 22 show BUN 29, creatinine 1.1, GFR Christina.  Hemoglobin 14.8, MCV 89, white blood cell count 8.8, platelet 231.  LFTs normal, TSH and free T4 within normal range.   Colonoscopy 05/18/12 -  normal exam - good prep - Dr. Laurence Spates   Past Medical History:  Diagnosis Date  . Atypical chest pain    a. 08/2015 Cath: LM nl, LAD 20ost, D1 small/nl, D2 mod, nl, RI small, nl, LCX nl, OM1 small, nl, OM2 mod nl, RCA 39m, EF 55-65%.  . Breast cancer ([place])    H/O LEFT MASTECTOMY IN 2000  . Diverticulitis   . Edema   . Esophageal reflux   . [name] bladder prolapse   . H/O: hysterectomy   . Hyperlipidemia   . Hypertension   . Hypokalemia   . Hypothyroidism   . Irregular heartbeat   . Melanoma ([name]) 1991   REMOVED FROM LEFT LEG IN 1991  . Neuropathy   . 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 PAF (paroxysmal atrial fibrillation) ([place])    a. CHA2DS2VASc = 3-->Xarelto since 2015;  b. 08/2015 48h holter in setting of palpitations-->PVC's.  . Palpitations    a. 08/2015 48h Holter: pvc's.  . 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 Post-menopausal   . PVCs (premature ventricular contractions)   . Shingles   . Thyroid cancer ([place])    s/p THYROIDECTOMY  . Whooping cough    as a child     Past Surgical History:  Procedure Laterality Date  .  tarsal tunnel release  2002   Right/Left  tarsal tunnel release  .  CARDIAC CATHETERIZATION  04/21/07   GLOBAL ESTIMATED EF IS 60 %; NORMAL LV FUNCTION; NORMAL CORONARY ARTERIES; BORDERLINE INCREASE IN THE AORTIC ROOT  . CARDIAC CATHETERIZATION N/A 09/18/2015   Procedure: Left Heart Cath and Coronary Angiography;  Surgeon: Burnell Blanks, MD;  Location: [facility];  Service: Cardiovascular;  Laterality: N/A;  . COLONOSCOPY    . CYSTOSCOPY  2007   anterior repair of cystocele with mesh  . MASTECTOMY  2000   LEFT BREAST  . MELANOMA EXCISION  1991   REMOVED FROM LEFT LEG  . NASAL SINUS SURGERY    . THYROIDECTOMY     Family History  Problem Relation Age of Onset  . Pancreatitis Mother   . Heart disease Mother   . Kidney disease Mother   . Hypertension Father   . Macular degeneration Father   . Heart disease Father   .  Breast cancer Neg Hx   . Colon cancer Neg Hx   . Esophageal cancer Neg Hx   . Pancreatic cancer Neg Hx   . Stomach cancer Neg Hx    Social History   Tobacco Use  . Smoking status: Never Smoker  . Smokeless tobacco: Never Used  Substance Use Topics  . Alcohol use: Yes    Comment: RARE  . Drug use: No   Current Outpatient Medications  Medication Sig Dispense Refill  . acetaminophen (TYLENOL) 500 MG tablet Take 500-1,000 mg by mouth daily as needed for mild pain or headache.    . Calcium Citrate (CITRACAL PO) Take 2 tablets by mouth 2 (two) times daily.    . cetirizine (ZYRTEC) 10 MG tablet Take by mouth.    . diltiazem (CARDIZEM CD) 120 MG 24 hr capsule TAKE ONE CAPSULE BY MOUTH DAILY 90 capsule 1  . ezetimibe (ZETIA) 10 MG tablet Take 10 mg by mouth daily.    . fish oil-omega-3 fatty acids 1000 MG capsule Take 2 g by mouth daily.     . fluticasone (FLONASE) 50 MCG/ACT nasal spray Place 2 sprays into the nose daily.    Marland Kitchen ipratropium (ATROVENT) 0.03 % nasal spray Place 2 sprays into the nose 2 (two) times daily as needed for rhinitis.     Marland Kitchen levothyroxine (SYNTHROID, LEVOTHROID) 150 MCG tablet Take 150 mcg by mouth daily before breakfast. 150 mcg Mon - Sat 37.5 mcg on Sunday    . Misc Natural Products (GLUCOSAMINE CHOND CMP TRIPLE) TABS Take by mouth.    . Multiple Vitamins-Minerals (PRESERVISION AREDS) CAPS Take by mouth.    . nebivolol (BYSTOLIC) 5 MG tablet TAKE 1 TABLET BY MOUTH DAILY    . omeprazole (PRILOSEC) 20 MG capsule Take 20 mg by mouth daily.    . potassium chloride SA (K-DUR,KLOR-CON) 20 MEQ tablet Take 40 mEq by mouth daily.     . Probiotic Product (ALIGN PO) Take 1 tablet by mouth daily.    . rivaroxaban (XARELTO) 20 MG TABS tablet Take 20 mg by mouth daily with supper.    . simvastatin (ZOCOR) 20 MG tablet Take 20 mg by mouth at bedtime.    . triamterene-hydrochlorothiazide (MAXZIDE) 75-50 MG tablet Take 1 tablet by mouth daily.      No current facility-administered  medications for this visit.   Allergies  Allergen Reactions  . Carbamazepine Hives, Other (See Comments) and Rash  . Oxaprozin Hives, Other (See Comments) and Rash  . Amoxicillin-Pot Clavulanate Diarrhea and Nausea Only  . Prednisolone   . Prednisone Itching and Other (  See Comments)    leg swelling /dyuria      Review of Systems: All systems reviewed and negative except where noted in HPI.    Labs per HPI  Physical Exam: BP 132/84 (BP Location: Left Arm, Patient Position: Sitting, Cuff Size: Normal)   Pulse 63   Temp (!) 97 F (36.1 C)   Ht 5\' 8"  (1.727 m)   Wt 158 lb 2 oz (71.7 kg)   BMI 24.04 kg/m  Constitutional: Pleasant,well-developed, [name] in no acute distress. HEENT: Normocephalic and atraumatic. Conjunctivae are normal. No scleral icterus. Neck supple.  Cardiovascular: Normal rate, regular rhythm.  Pulmonary/chest: Effort normal and breath sounds normal. No wheezing, rales or rhonchi. Abdominal: Soft, nondistended, nontender.  There are no masses palpable.  Extremities: no edema Lymphadenopathy: No cervical adenopathy noted. Neurological: Alert and oriented to person place and time. Skin: Skin is warm and dry. No rashes noted. Psychiatric: Normal mood and affect. Behavior is normal.   ASSESSMENT AND PLAN: 79 year old [name] here for new patient assessment the following:  Change in bowel habits / fecal incontinence / anticoagulated - history as above, greater than 1 year of altered bowel habits that has progressively worsened with increased frequency with urgency leading to occasional incontinence.  This is impacting her quality of life.  Use of fiber and stool softeners have not helped, led to increased frequency.  Discussed options with the patient.  Her last colonoscopy was in 2014.  I think a colonoscopy at this point given her persistent symptoms is reasonable, rule out polyp/mass lesion, rule out colitis, including microscopic colitis.  I discussed risk and  benefits of colonoscopy and anesthesia with her and she wanted to proceed.  We will reach out to her cardiologist to see if it is okay to hold her Xarelto for 3 days prior to the exam given GFR Christina.  She agreed with the plan, all questions answered, further recommendations pending the results and her course.  [place] Cellar, MD [facility]  CC: Crist Infante, MD

## 2019-07-03 NOTE — Telephone Encounter (Signed)
[facility] Pre-operative Risk Assessment     Request for surgical clearance:     Endoscopy Procedure  What type of surgery is being performed?     COLONOSCOPY  When is this surgery scheduled?     07-09-19  What type of clearance is required ?   Pharmacy  Are there any medications that need to be held prior to surgery and how long? [place]  3 DAYS  Practice name and name of physician performing surgery?  [facility]  What is your office phone and fax number?      Phone- [phone]  Fax- [phone]  ATTN: Tia Alert, CMA  Anesthesia type (None, local, MAC, general) ?      MAC  Thank you!

## 2019-07-04 NOTE — Telephone Encounter (Signed)
Lm on cell phone that patient could hold her Xarelto for 3 days prior to her procedures.  Asked her to call and leave a message for me or Jan that she received and understood this information

## 2019-07-04 NOTE — Telephone Encounter (Signed)
Patient does not have a history of stroke. Her CHADS2-VASc is boarderline high, but less than 7. Ok to hold 3 days. Please resume as soon as safely possible.

## 2019-07-05 NOTE — Telephone Encounter (Signed)
Spoke to pt.  She understands to hold Xarelto starting tomorrow, 5-14, for colon on Monday, 5-17.

## 2019-07-09 ENCOUNTER — Other Ambulatory Visit: Payer: Self-pay

## 2019-07-09 ENCOUNTER — Ambulatory Visit (AMBULATORY_SURGERY_CENTER): Payer: Medicare Other | Admitting: Gastroenterology

## 2019-07-09 ENCOUNTER — Encounter: Payer: Self-pay | Admitting: Gastroenterology

## 2019-07-09 VITALS — BP 119/65 | HR 49 | Temp 96.8°F | Resp 11 | Ht 68.0 in | Wt 158.0 lb

## 2019-07-09 DIAGNOSIS — R159 Full incontinence of feces: Secondary | ICD-10-CM | POA: Diagnosis not present

## 2019-07-09 DIAGNOSIS — K219 Gastro-esophageal reflux disease without esophagitis: Secondary | ICD-10-CM | POA: Diagnosis not present

## 2019-07-09 DIAGNOSIS — D123 Benign neoplasm of transverse colon: Secondary | ICD-10-CM | POA: Diagnosis not present

## 2019-07-09 DIAGNOSIS — R194 Change in bowel habit: Secondary | ICD-10-CM

## 2019-07-09 DIAGNOSIS — D120 Benign neoplasm of cecum: Secondary | ICD-10-CM | POA: Diagnosis not present

## 2019-07-09 DIAGNOSIS — I10 Essential (primary) hypertension: Secondary | ICD-10-CM | POA: Diagnosis not present

## 2019-07-09 DIAGNOSIS — D122 Benign neoplasm of ascending colon: Secondary | ICD-10-CM | POA: Diagnosis not present

## 2019-07-09 DIAGNOSIS — K648 Other hemorrhoids: Secondary | ICD-10-CM

## 2019-07-09 DIAGNOSIS — D125 Benign neoplasm of sigmoid colon: Secondary | ICD-10-CM

## 2019-07-09 MED ORDER — SODIUM CHLORIDE 0.9 % IV SOLN: 500.0000 mL | Freq: Once | INTRAVENOUS | Status: DC

## 2019-07-09 NOTE — Op Note (Addendum)
[facility] Patient Name: Christina [name] Procedure Date: 07/09/2019 8:29 AM MRN: [id number] Endoscopist: Remo Lipps P. Havery Moros , MD Age: 79 Referring MD:  Date of Birth: 1940-12-30 Gender: Female Account #: [id number] Procedure:                Colonoscopy Indications:              Change in bowel habits Medicines:                Monitored Anesthesia Care Procedure:                Pre-Anesthesia Assessment:                           - Prior to the procedure, a History and Physical                            was performed, and patient medications and                            allergies were reviewed. The patient's tolerance of                            previous anesthesia was also reviewed. The risks                            and benefits of the procedure and the sedation                            options and risks were discussed with the patient.                            All questions were answered, and informed consent                            was obtained. Prior Anticoagulants: The patient has                            taken Xarelto (rivaroxaban), last dose was 3 days                            prior to procedure. ASA Grade Assessment: II - A                            patient with mild systemic disease. After reviewing                            the risks and benefits, the patient was deemed in                            satisfactory condition to undergo the procedure.                           After obtaining informed consent, the colonoscope  was passed under direct vision. Throughout the                            procedure, the patient's blood pressure, pulse, and                            oxygen saturations were monitored continuously. The                            Colonoscope was introduced through the anus and                            advanced to the the terminal ileum, with                            identification of the  appendiceal orifice and IC                            valve. The colonoscopy was performed without                            difficulty. The patient tolerated the procedure                            well. The quality of the bowel preparation was                            adequate. The terminal ileum, ileocecal valve,                            appendiceal orifice, and rectum were photographed. Scope In: 8:55:17 AM Scope Out: 9:19:01 AM Scope Withdrawal Time: 0 hours 20 minutes 2 seconds  Total Procedure Duration: 0 hours 23 minutes 44 seconds  Findings:                 The perianal and digital rectal examinations were                            normal.                           The terminal ileum appeared normal. (limited views                            obtained)                           A diminutive polyp was found in the cecum. The                            polyp was sessile. The polyp was removed with a                            cold biopsy forceps. Resection and retrieval were  complete.                           Two sessile polyps were found in the ascending                            colon. The polyps were 3 to 6 mm in size. These                            polyps were removed with a cold snare. Resection                            and retrieval were complete.                           A 3 mm polyp was found in the transverse colon. The                            polyp was sessile. The polyp was removed with a                            cold snare. Resection and retrieval were complete.                           A 6 mm polyp was found in the sigmoid colon. The                            polyp was pedunculated. The polyp was removed with                            a hot snare. Resection and retrieval were complete.                           The colon was tortuous.                           Internal hemorrhoids were found during                             retroflexion. The hemorrhoids were small.                           The exam was otherwise without abnormality. No                            obvious inflammatory changes. Rectal vault was                            small, retroflexed views not obtained.                           Biopsies for histology were taken with a cold  forceps from the right colon, left colon and                            transverse colon for evaluation of microscopic                            colitis. Complications:            No immediate complications. Estimated blood loss:                            Minimal. Estimated Blood Loss:     Estimated blood loss was minimal. Impression:               - The examined portion of the ileum was normal.                           - One diminutive polyp in the cecum, removed with a                            cold biopsy forceps. Resected and retrieved.                           - Two 3 to 6 mm polyps in the ascending colon,                            removed with a cold snare. Resected and retrieved.                           - One 3 mm polyp in the transverse colon, removed                            with a cold snare. Resected and retrieved.                           - One 6 mm polyp in the sigmoid colon, removed with                            a hot snare. Resected and retrieved.                           - Tortuous colon.                           - Internal hemorrhoids.                           - The examination was otherwise normal.                           - Biopsies were taken with a cold forceps from the                            right colon, left colon and transverse colon for  evaluation of microscopic colitis. Recommendation:           - Patient has a contact number available for                            emergencies. The signs and symptoms of potential                            delayed complications  were discussed with the                            patient. Return to normal activities tomorrow.                            Written discharge instructions were provided to the                            patient.                           - Resume previous diet.                           - Continue present medications.                           - Resume Xarelto tomorrow                           - Await pathology results with further                            recommendations. Remo Lipps P. Rever Pichette, MD 07/09/2019 9:26:14 AM This report has been signed electronically.

## 2019-07-09 NOTE — Progress Notes (Signed)
Called to room to assist during endoscopic procedure.  Patient ID and intended procedure confirmed with present staff. Received instructions for my participation in the procedure from the performing physician.  

## 2019-07-09 NOTE — Progress Notes (Signed)
Vitals-KA Temp-LC  Pt's states no medical or surgical changes since previsit or office visit.

## 2019-07-09 NOTE — Progress Notes (Signed)
Report to PACU, RN, vss, BBS= Clear.  

## 2019-07-09 NOTE — Patient Instructions (Signed)
Discharge instructions given. Handouts on polyps and hemorrhoids. Resume previous medications. Resume Xarelto tomorrow. YOU HAD AN ENDOSCOPIC PROCEDURE TODAY AT [facility]:   Refer to the procedure report that was given to you for any specific questions about what was found during the examination.  If the procedure report does not answer your questions, please call your gastroenterologist to clarify.  If you requested that your care partner not be given the details of your procedure findings, then the procedure report has been included in a sealed envelope for you to review at your convenience later.  YOU SHOULD EXPECT: Some feelings of bloating in the abdomen. Passage of more gas than usual.  Walking can help get rid of the air that was put into your GI tract during the procedure and reduce the bloating. If you had a lower endoscopy (such as a colonoscopy or flexible sigmoidoscopy) you may notice spotting of blood in your stool or on the toilet paper. If you underwent a bowel prep for your procedure, you may not have a normal bowel movement for a few days.  Please Note:  You might notice some irritation and congestion in your nose or some drainage.  This is from the oxygen used during your procedure.  There is no need for concern and it should clear up in a day or so.  SYMPTOMS TO REPORT IMMEDIATELY:   Following lower endoscopy (colonoscopy or flexible sigmoidoscopy):  Excessive amounts of blood in the stool  Significant tenderness or worsening of abdominal pains  Swelling of the abdomen that is new, acute  Fever of 100F or higher   For urgent or emergent issues, a gastroenterologist can be reached at any hour by calling [phone]. Do not use MyChart messaging for urgent concerns.    DIET:  We do recommend a small meal at first, but then you may proceed to your regular diet.  Drink plenty of fluids but you should avoid alcoholic beverages for 24 hours.  ACTIVITY:   You should plan to take it easy for the rest of today and you should NOT DRIVE or use heavy machinery until tomorrow (because of the sedation medicines used during the test).    FOLLOW UP: Our staff will call the number listed on your records 48-72 hours following your procedure to check on you and address any questions or concerns that you may have regarding the information given to you following your procedure. If we do not reach you, we will leave a message.  We will attempt to reach you two times.  During this call, we will ask if you have developed any symptoms of COVID 19. If you develop any symptoms (ie: fever, flu-like symptoms, shortness of breath, cough etc.) before then, please call [phone].  If you test positive for Covid 19 in the 2 weeks post procedure, please call and report this information to [place].    If any biopsies were taken you will be contacted by phone or by letter within the next 1-3 weeks.  Please call us at [phone] if you have not heard about the biopsies in 3 weeks.    SIGNATURES/CONFIDENTIALITY: You and/or your care partner have signed paperwork which will be entered into your electronic medical record.  These signatures attest to the fact that that the information above on your After Visit Summary has been reviewed and is understood.  Full responsibility of the confidentiality of this discharge information lies with you and/or your care-partner.

## 2019-07-11 ENCOUNTER — Telehealth: Payer: Self-pay | Admitting: *Deleted

## 2019-07-11 NOTE — Telephone Encounter (Signed)
First follow up call attempt.  Message left on answering machine. 

## 2019-07-11 NOTE — Telephone Encounter (Signed)
  Follow up Call-  Call back number 07/09/2019  Post procedure Call Back phone  # [phone]  Permission to leave phone message Yes  Some recent data might be hidden     Patient questions:  Do you have a fever, pain , or abdominal swelling? No. Pain Score  0 *  Have you tolerated food without any problems? Yes.    Have you been able to return to your normal activities? Yes.    Do you have any questions about your discharge instructions: Diet   No. Medications  No. Follow up visit  No.  Do you have questions or concerns about your Care? No.  Actions: * If pain score is 4 or above: No action needed, pain <4.  1. Have you developed a fever since your procedure? no  2.   Have you had an respiratory symptoms (SOB or cough) since your procedure? no  3.   Have you tested positive for COVID 19 since your procedure no  4.   Have you had any family members/close contacts diagnosed with the COVID 19 since your procedure?  no   If yes to any of these questions please route to Joylene John, RN and Erenest Rasher, RN

## 2019-07-12 ENCOUNTER — Other Ambulatory Visit: Payer: Self-pay

## 2019-07-12 DIAGNOSIS — R197 Diarrhea, unspecified: Secondary | ICD-10-CM

## 2019-07-12 NOTE — Telephone Encounter (Signed)
Spoke with patient and clarified Dr. Doyne Keel message reguarding Imodium and labs ordered.  Pt. Verbalized understanding.

## 2019-07-12 NOTE — Telephone Encounter (Signed)
Pt stated that she had received a recovery call asking if she takes imodium.  She stated that she does not take imodium and would like to discuss.

## 2019-07-17 DIAGNOSIS — H353211 Exudative age-related macular degeneration, right eye, with active choroidal neovascularization: Secondary | ICD-10-CM | POA: Diagnosis not present

## 2019-07-17 DIAGNOSIS — H353221 Exudative age-related macular degeneration, left eye, with active choroidal neovascularization: Secondary | ICD-10-CM | POA: Diagnosis not present

## 2019-07-18 ENCOUNTER — Other Ambulatory Visit (INDEPENDENT_AMBULATORY_CARE_PROVIDER_SITE_OTHER): Payer: Medicare Other

## 2019-07-18 DIAGNOSIS — R197 Diarrhea, unspecified: Secondary | ICD-10-CM

## 2019-07-18 LAB — IGA: IgA: 287 mg/dL (ref 68–378)

## 2019-07-19 LAB — TISSUE TRANSGLUTAMINASE, IGA: (tTG) Ab, IgA: <1 U/mL

## 2019-07-24 ENCOUNTER — Ambulatory Visit: Payer: Medicare Other | Admitting: Sports Medicine

## 2019-07-26 DIAGNOSIS — H1133 Conjunctival hemorrhage, bilateral: Secondary | ICD-10-CM | POA: Diagnosis not present

## 2019-07-26 DIAGNOSIS — Z961 Presence of intraocular lens: Secondary | ICD-10-CM | POA: Diagnosis not present

## 2019-07-26 DIAGNOSIS — H353230 Exudative age-related macular degeneration, bilateral, stage unspecified: Secondary | ICD-10-CM | POA: Diagnosis not present

## 2019-07-27 DIAGNOSIS — E038 Other specified hypothyroidism: Secondary | ICD-10-CM | POA: Diagnosis not present

## 2019-07-27 DIAGNOSIS — Z0000 Encounter for general adult medical examination without abnormal findings: Secondary | ICD-10-CM | POA: Diagnosis not present

## 2019-07-27 DIAGNOSIS — M859 Disorder of bone density and structure, unspecified: Secondary | ICD-10-CM | POA: Diagnosis not present

## 2019-07-27 DIAGNOSIS — E7849 Other hyperlipidemia: Secondary | ICD-10-CM | POA: Diagnosis not present

## 2019-07-31 ENCOUNTER — Encounter: Payer: Self-pay | Admitting: Sports Medicine

## 2019-07-31 ENCOUNTER — Ambulatory Visit: Payer: Medicare Other | Admitting: Sports Medicine

## 2019-07-31 ENCOUNTER — Other Ambulatory Visit: Payer: Self-pay

## 2019-07-31 ENCOUNTER — Telehealth: Payer: Self-pay | Admitting: *Deleted

## 2019-07-31 VITALS — Temp 96.9°F

## 2019-07-31 DIAGNOSIS — L97521 Non-pressure chronic ulcer of other part of left foot limited to breakdown of skin: Secondary | ICD-10-CM

## 2019-07-31 DIAGNOSIS — I739 Peripheral vascular disease, unspecified: Secondary | ICD-10-CM

## 2019-07-31 DIAGNOSIS — G629 Polyneuropathy, unspecified: Secondary | ICD-10-CM

## 2019-07-31 DIAGNOSIS — M79672 Pain in left foot: Secondary | ICD-10-CM

## 2019-07-31 MED ORDER — CLINDAMYCIN HCL 300 MG PO CAPS
300.0000 mg | ORAL_CAPSULE | Freq: Three times a day (TID) | ORAL | 0 refills | Status: DC
Start: 2019-07-31 — End: 2019-12-17

## 2019-07-31 NOTE — Patient Instructions (Signed)
After shower pat dry, apply Prisma sponge (cut a small piece to cover the wound) and then apply guaze and coban wrap once daily

## 2019-07-31 NOTE — Progress Notes (Signed)
Subjective: Christina [name] is a 79 y.o. female patient who returns to office for follow up evaluation of Left foot ulceration, reports that her husband has been helping to change like before using Iodosorb but is concerned because it is still bleeding and has not healed with some increased pain to the area. Denies constitutional symptoms or any other pedal complaints at this time.  Patient Active Problem List   Diagnosis Date Noted  . Cervical spondylosis without myelopathy 08/23/2017  . Meningioma ([place]) 08/23/2017  . 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 Chronic seasonal allergic rhinitis 05/07/2016  . Tinnitus, bilateral 05/07/2016  . ETD (Eustachian tube dysfunction), bilateral 02/05/2016  . Otorrhagia of left ear 02/05/2016  . Atypical chest pain   . PAF (paroxysmal atrial fibrillation) ([place])   . Precordial pain   . Palpitations 09/16/2015  . Paroxysmal atrial fibrillation ([place]) 08/03/2010  . Hypertension 08/03/2010  . History of breast cancer 08/03/2010  . History of thyroid cancer 08/03/2010  . History of melanoma 08/03/2010  . Tarsal tunnel syndrome 08/03/2010    Current Outpatient Medications on File Prior to Visit  Medication Sig Dispense Refill  . acetaminophen (TYLENOL) 500 MG tablet Take 500-1,000 mg by mouth daily as needed for mild pain or headache.    . Calcium Citrate (CITRACAL PO) Take 2 tablets by mouth 2 (two) times daily.    . cetirizine (ZYRTEC) 10 MG tablet Take by mouth.    . diltiazem (CARDIZEM CD) 120 MG 24 hr capsule TAKE ONE CAPSULE BY MOUTH DAILY 90 capsule 1  . ezetimibe (ZETIA) 10 MG tablet Take 10 mg by mouth daily.    . fish oil-omega-3 fatty acids 1000 MG capsule Take 2 g by mouth daily.     . fluticasone (FLONASE) 50 MCG/ACT nasal spray Place 2 sprays into the nose daily.    Marland Kitchen galantamine (RAZADYNE ER) 16 MG 24 hr capsule Take 16 mg by mouth daily.    Marland Kitchen ipratropium (ATROVENT) 0.03 % nasal spray Place 2 sprays  into the nose 2 (two) times daily as needed for rhinitis.     Marland Kitchen levothyroxine (SYNTHROID, LEVOTHROID) 150 MCG tablet Take 150 mcg by mouth daily before breakfast. 150 mcg Mon - Sat 37.5 mcg on Sunday    . Misc Natural Products (GLUCOSAMINE CHOND CMP TRIPLE) TABS Take by mouth.    . Multiple Vitamins-Minerals (PRESERVISION AREDS) CAPS Take by mouth.    . nebivolol (BYSTOLIC) 5 MG tablet TAKE 1 TABLET BY MOUTH DAILY    . omeprazole (PRILOSEC) 20 MG capsule Take 20 mg by mouth daily.    . potassium chloride SA (K-DUR,KLOR-CON) 20 MEQ tablet Take 40 mEq by mouth daily.     . Probiotic Product (ALIGN PO) Take 1 tablet by mouth daily.    . simvastatin (ZOCOR) 20 MG tablet Take 20 mg by mouth at bedtime.    Marland Kitchen SUTAB [phone] MG TABS     . triamterene-hydrochlorothiazide (MAXZIDE) 75-50 MG tablet Take 1 tablet by mouth daily.     Alveda Reasons 15 MG TABS tablet Take 15 mg by mouth daily.     No current facility-administered medications on file prior to visit.    Allergies  Allergen Reactions  . Carbamazepine Hives, Other (See Comments) and Rash  . Oxaprozin Hives, Other (See Comments) and Rash  . Amoxicillin-Pot Clavulanate Diarrhea and Nausea Only  . Prednisolone   . Prednisone Itching and Other (See Comments)    leg swelling /dyuria  Objective:  General: Alert and oriented x3 in no acute distress  Dermatology: There is a partial thickness ulceration present plantar left foot that measured 0.7 x 0.6 cm with keratotic border and a granular base with minimal bloody drainage, pet hair in wound, + mild odor, mild swelling no erythema, all nails x 10 are short and thick with left 1st and 2nd toenails most involved.    Vascular: Dorsalis Pedis 1/4 and Posterior Tibial pedal pulses 0/4, Capillary Fill Time 3 seconds, + pedal hair growth bilateral, 1+ pitting edema bilateral lower extremities, Temperature gradient within normal limits.  Neurology: Protective sensation absent secondary to  history of neuropathy.  Musculoskeletal: Mild tenderness with palpation at the ulcer left foot, Muscular strength 5/5 in all groups without pain or limitation on range of motion.Hammertoe and prominent 5th met head bilateral L>R.   Assessment and Plan: Problem List Items Addressed This Visit    None    Visit Diagnoses    Foot ulcer, left, limited to breakdown of skin ([place])    -  Primary   Neuropathy       PVD (peripheral vascular disease) ([place])       Relevant Medications   XARELTO 15 MG TABS tablet   Left foot pain          -Complete examination performed -Re-Discussed treatment options for left foot ulcer again and continued care -Excisionally dedbrided ulceration at left plantar forefoot to healthy bleeding borders removing nonviable tissue using a sterile chisel blade. Wound measures post debridement as above. Wound was debrided to the level of the dermis with viable wound base exposed to promote healing. Hemostasis was achieved with manuel pressure. Patient tolerated procedure well without any discomfort or anesthesia necessary for this wound debridement.  -Advised patient to avoid walking barefoot because there is significant amount pet hair in the wound at today's visit -Applied Prisma and dry sterile dressing and instructed patient to continue with daily dressings at home consisting of the same daily; gave patient some supplies to get started of Prisma -Dispensed a surgical shoe advised Pegasys however this product is on backorder we will apply the Pegasys next visit when we have it -For the odor and preventive measures prescribed clindamycin for patient to take as instructed - Advised patient to go to the ER or return to office if the wound worsens or if constitutional symptoms are present. -Return to office in 2-3 weeks for wound care, x-rays and dispensing of peg assist in her surgical shoe at next visit.  If her wound still fails to continue to improve we will also perform ABIs  in office for further evaluation.  Landis Martins, DPM

## 2019-07-31 NOTE — Telephone Encounter (Signed)
Pt's husband, Richardson Landry states pt's antibiotic is not at the pharmacy.

## 2019-07-31 NOTE — Telephone Encounter (Signed)
I reviewed the Medication orders and the Clindamycin was not at the pharmacy. Left message informing pt and husband the medication had been sent to the [facility] in [place], [place] on [address] today at 9:31am.

## 2019-08-01 DIAGNOSIS — R159 Full incontinence of feces: Secondary | ICD-10-CM | POA: Diagnosis not present

## 2019-08-01 DIAGNOSIS — K469 Unspecified abdominal hernia without obstruction or gangrene: Secondary | ICD-10-CM | POA: Diagnosis not present

## 2019-08-01 DIAGNOSIS — N3281 Overactive bladder: Secondary | ICD-10-CM | POA: Diagnosis not present

## 2019-08-01 DIAGNOSIS — K5900 Constipation, unspecified: Secondary | ICD-10-CM | POA: Diagnosis not present

## 2019-08-03 DIAGNOSIS — R159 Full incontinence of feces: Secondary | ICD-10-CM | POA: Diagnosis not present

## 2019-08-03 DIAGNOSIS — Z0000 Encounter for general adult medical examination without abnormal findings: Secondary | ICD-10-CM | POA: Diagnosis not present

## 2019-08-03 DIAGNOSIS — N329 Bladder disorder, unspecified: Secondary | ICD-10-CM | POA: Diagnosis not present

## 2019-08-03 DIAGNOSIS — D6869 Other thrombophilia: Secondary | ICD-10-CM | POA: Diagnosis not present

## 2019-08-03 DIAGNOSIS — R82998 Other abnormal findings in urine: Secondary | ICD-10-CM | POA: Diagnosis not present

## 2019-08-21 ENCOUNTER — Ambulatory Visit (INDEPENDENT_AMBULATORY_CARE_PROVIDER_SITE_OTHER): Payer: Medicare Other

## 2019-08-21 ENCOUNTER — Ambulatory Visit: Payer: Medicare Other | Admitting: Sports Medicine

## 2019-08-21 ENCOUNTER — Other Ambulatory Visit: Payer: Self-pay

## 2019-08-21 ENCOUNTER — Ambulatory Visit (INDEPENDENT_AMBULATORY_CARE_PROVIDER_SITE_OTHER): Payer: Medicare Other | Admitting: Sports Medicine

## 2019-08-21 ENCOUNTER — Encounter: Payer: Self-pay | Admitting: Sports Medicine

## 2019-08-21 DIAGNOSIS — L97521 Non-pressure chronic ulcer of other part of left foot limited to breakdown of skin: Secondary | ICD-10-CM

## 2019-08-21 DIAGNOSIS — I739 Peripheral vascular disease, unspecified: Secondary | ICD-10-CM | POA: Diagnosis not present

## 2019-08-21 DIAGNOSIS — G629 Polyneuropathy, unspecified: Secondary | ICD-10-CM | POA: Diagnosis not present

## 2019-08-21 NOTE — Progress Notes (Signed)
Subjective: Christina [name] is a 79 y.o. female patient who returns to office for follow up evaluation of Left foot ulceration, reports that her husband has been helping to change like before using Prisma and reports that there is no improvement area has still been bleeding randomly especially when she gets up to go to the bathroom at bedtime has also been applying Prisma and a Band-Aid and walking around at night when using bathroom without shoe and has been using shoe even in the garden. Denies constitutional symptoms or any other pedal complaints at this time.  Patient reports that her friend is waiting in the car for her that is visiting from out of town.  Patient Active Problem List   Diagnosis Date Noted  . Cervical spondylosis without myelopathy 08/23/2017  . Meningioma ([place]) 08/23/2017  . 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 Chronic seasonal allergic rhinitis 05/07/2016  . Tinnitus, bilateral 05/07/2016  . ETD (Eustachian tube dysfunction), bilateral 02/05/2016  . Otorrhagia of left ear 02/05/2016  . Atypical chest pain   . PAF (paroxysmal atrial fibrillation) ([place])   . Precordial pain   . Palpitations 09/16/2015  . Paroxysmal atrial fibrillation ([place]) 08/03/2010  . Hypertension 08/03/2010  . History of breast cancer 08/03/2010  . History of thyroid cancer 08/03/2010  . History of melanoma 08/03/2010  . Tarsal tunnel syndrome 08/03/2010    Current Outpatient Medications on File Prior to Visit  Medication Sig Dispense Refill  . acetaminophen (TYLENOL) 500 MG tablet Take 500-1,000 mg by mouth daily as needed for mild pain or headache.    . Calcium Citrate (CITRACAL PO) Take 2 tablets by mouth 2 (two) times daily.    . cetirizine (ZYRTEC) 10 MG tablet Take by mouth.    . clindamycin (CLEOCIN) 300 MG capsule Take 1 capsule (300 mg total) by mouth 3 (three) times daily. 30 capsule 0  . diltiazem (CARDIZEM CD) 120 MG 24 hr capsule TAKE ONE  CAPSULE BY MOUTH DAILY 90 capsule 1  . ezetimibe (ZETIA) 10 MG tablet Take 10 mg by mouth daily.    . fish oil-omega-3 fatty acids 1000 MG capsule Take 2 g by mouth daily.     . fluticasone (FLONASE) 50 MCG/ACT nasal spray Place 2 sprays into the nose daily.    Marland Kitchen galantamine (RAZADYNE ER) 16 MG 24 hr capsule Take 16 mg by mouth daily.    Marland Kitchen ipratropium (ATROVENT) 0.03 % nasal spray Place 2 sprays into the nose 2 (two) times daily as needed for rhinitis.     Marland Kitchen levothyroxine (SYNTHROID, LEVOTHROID) 150 MCG tablet Take 150 mcg by mouth daily before breakfast. 150 mcg Mon - Sat 37.5 mcg on Sunday    . Misc Natural Products (GLUCOSAMINE CHOND CMP TRIPLE) TABS Take by mouth.    . Multiple Vitamins-Minerals (PRESERVISION AREDS) CAPS Take by mouth.    . nebivolol (BYSTOLIC) 5 MG tablet TAKE 1 TABLET BY MOUTH DAILY    . omeprazole (PRILOSEC) 20 MG capsule Take 20 mg by mouth daily.    . potassium chloride SA (K-DUR,KLOR-CON) 20 MEQ tablet Take 40 mEq by mouth daily.     . Probiotic Product (ALIGN PO) Take 1 tablet by mouth daily.    . simvastatin (ZOCOR) 20 MG tablet Take 20 mg by mouth at bedtime.    Marland Kitchen SUTAB [phone] MG TABS     . triamterene-hydrochlorothiazide (MAXZIDE) 75-50 MG tablet Take 1 tablet by mouth daily.     Alveda Reasons 15 MG  TABS tablet Take 15 mg by mouth daily.     No current facility-administered medications on file prior to visit.    Allergies  Allergen Reactions  . Carbamazepine Hives, Other (See Comments) and Rash  . Oxaprozin Hives, Other (See Comments) and Rash  . Amoxicillin-Pot Clavulanate Diarrhea and Nausea Only  . Prednisolone   . Prednisone Itching and Other (See Comments)    leg swelling /dyuria     Objective:  General: Alert and oriented x3 in no acute distress  Dermatology: There is a partial thickness ulceration present plantar left foot that measured 0.5 x 0.5 cm with keratotic border and a granular base with minimal bloody drainage, dirt on the bottom of  the foot, no odor, mild swelling no erythema, all nails x 10 are short and thick with left 1st and 2nd toenails most involved.    Vascular: Dorsalis Pedis 1/4 and Posterior Tibial pedal pulses 0/4, Capillary Fill Time 3 seconds, + pedal hair growth bilateral, 1+ pitting edema bilateral lower extremities, Temperature gradient within normal limits.  Neurology: Protective sensation absent secondary to history of neuropathy.  Musculoskeletal: Mild tenderness with palpation at the ulcer left foot, Muscular strength 5/5 in all groups without pain or limitation on range of motion.Hammertoe and prominent 5th met head bilateral L>R.   Assessment and Plan: Problem List Items Addressed This Visit    None    Visit Diagnoses    Foot ulcer, left, limited to breakdown of skin ([place])    -  Primary   Relevant Orders   DG Foot Complete Left (Completed)   PAD (peripheral artery disease) ([place])       Relevant Orders   POCT ABI Screening for [place] No Charge   Neuropathy       PVD (peripheral vascular disease) ([place])          -Complete examination performed -X-rays reviewed revealing no acute osseous finding at area of ulceration -Re-Discussed treatment options for left foot ulcer again and continued care -Excisionally dedbrided ulceration at left plantar forefoot to healthy bleeding borders removing nonviable tissue using a sterile chisel blade. Wound measures post debridement as above. Wound was debrided to the level of the dermis with viable wound base exposed to promote healing. Hemostasis was achieved with manuel pressure. Patient tolerated procedure well without any discomfort or anesthesia necessary for this wound debridement.  -Advised patient to avoid walking barefoot or walking around with very minimal dressing were offloading advised patient to continue with using the shoe even in the house and to keep it close by her bed at night to put on when she goes to the bathroom -Applied Prisma and dry  sterile dressing and instructed patient to continue with daily dressings at home consisting of the same daily; gave patient some supplies Prisma as well as gauze padding and Coban to help better address the left foot ulceration daily as instructed -Continue with surgical shoe and applied additional offloading padding to shoe patient also had mud in the shoe and advised patient that it is very important for her to protect her foot and to stay off the area as much as possible to allow for healing -Continue with clindamycin until completed - Advised patient to go to the ER or return to office if the wound worsens or if constitutional symptoms are present. -Patient to follow-up with vascular regarding PAD as noted on ABIs performed in office today -Return to office in 2 weeks for continued wound care or sooner if problems or  issues arise.  Landis Martins, DPM

## 2019-08-23 ENCOUNTER — Telehealth: Payer: Self-pay | Admitting: *Deleted

## 2019-08-23 DIAGNOSIS — L97521 Non-pressure chronic ulcer of other part of left foot limited to breakdown of skin: Secondary | ICD-10-CM

## 2019-08-23 DIAGNOSIS — I739 Peripheral vascular disease, unspecified: Secondary | ICD-10-CM

## 2019-08-23 NOTE — Telephone Encounter (Signed)
Faxed orders to [facility]. 

## 2019-08-23 NOTE — Telephone Encounter (Signed)
-----   Message from Christina [name], [place] sent at 08/21/2019 10:45 PM EDT ----- Regarding: Abnormal ABI on left in office Make sure patient has vascular f/u  ABI in office suggest pad on the left and 1.3 on the right

## 2019-08-28 DIAGNOSIS — H353211 Exudative age-related macular degeneration, right eye, with active choroidal neovascularization: Secondary | ICD-10-CM | POA: Diagnosis not present

## 2019-08-28 DIAGNOSIS — H353221 Exudative age-related macular degeneration, left eye, with active choroidal neovascularization: Secondary | ICD-10-CM | POA: Diagnosis not present

## 2019-08-29 ENCOUNTER — Other Ambulatory Visit: Payer: Self-pay | Admitting: Cardiovascular Disease

## 2019-08-29 ENCOUNTER — Other Ambulatory Visit (HOSPITAL_COMMUNITY): Payer: Self-pay | Admitting: Sports Medicine

## 2019-08-29 DIAGNOSIS — L97901 Non-pressure chronic ulcer of unspecified part of unspecified lower leg limited to breakdown of skin: Secondary | ICD-10-CM

## 2019-08-29 NOTE — Telephone Encounter (Signed)
Xarelto 15mg  refill request received. Pt is 79 years old, weight-71.7kg, Crea-0.90 on 07/27/2019 via [place] at [facility], last seen by Dr. Angelena Form on 12/13/2018, Diagnosis-Afib, CrCl-58.66ml/min.  Xarelto dose was changed to 15mg  on 02/26/2019 as CrCl was 47.66ml/min at that time, now pt qualifies for 20mg  dose agian. Will send a message to Dr. Angelena Form regarding this.

## 2019-08-30 MED ORDER — APIXABAN 5 MG PO TABS
5.0000 mg | ORAL_TABLET | Freq: Two times a day (BID) | ORAL | 2 refills | Status: DC
Start: 2019-08-30 — End: 2020-06-09

## 2019-08-30 NOTE — Telephone Encounter (Signed)
   Burnell Blanks, MD  Ramond Dial, RPH-CPP; Rodman Key, RN; P Cv Div [facility] Triage [employer]. I will ask nursing to review options with her regarding either Xarelto 20 mg daily now or changing her to Eliquis 5 mg po BID. Gerald Stabs   ----- Message -----  From: Ramond Dial, RPH-CPP  Sent: 08/29/2019  4:44 PM EDT  To: Burnell Blanks, MD   Linus Orn, I think she should be increased back to 20mg . The other option would be to change her to Eliquis to avoid the back and forth of dosing.  ----- Message -----  From: Burnell Blanks, MD  Sent: 08/29/2019  4:16 PM EDT  To: Marcos Eke, RN, *   It seems like we should go back to the 20 mg dose. What do you think Melissa or Megan?  Gerald Stabs

## 2019-08-30 NOTE — Telephone Encounter (Signed)
Pt notified.  She would like to stop Xarelto 15 mg now and make change to Eliquis 5 mg twice daily today.  Will send prescription to [place]

## 2019-08-30 NOTE — Telephone Encounter (Signed)
I spoke with patient. She would like to change to Eliquis 5 mg twice daily. She just picked up prescription for Xarelto 15 mg yesterday.  30 day supply.  She is asking if OK to continue Xarelto 15 mg until she finishes this bottle.  She is willing to make change now if recommended.

## 2019-08-30 NOTE — Telephone Encounter (Signed)
I placed call to patient and left message to call office 

## 2019-08-30 NOTE — Telephone Encounter (Signed)
She is technically being slightly under dosed. She has been fine up until this point, but there is a slight increased risk by being on the lower dose. I understand her desire not to waste her $. It is up to the patient, as long as she understand their is a risk, although low, of stroke if she finishes out the lower dose.

## 2019-09-03 ENCOUNTER — Other Ambulatory Visit: Payer: Self-pay | Admitting: Internal Medicine

## 2019-09-03 DIAGNOSIS — Z1231 Encounter for screening mammogram for malignant neoplasm of breast: Secondary | ICD-10-CM

## 2019-09-04 ENCOUNTER — Ambulatory Visit: Payer: Medicare Other | Admitting: Sports Medicine

## 2019-09-04 ENCOUNTER — Encounter: Payer: Self-pay | Admitting: Sports Medicine

## 2019-09-04 ENCOUNTER — Other Ambulatory Visit: Payer: Self-pay

## 2019-09-04 DIAGNOSIS — G629 Polyneuropathy, unspecified: Secondary | ICD-10-CM

## 2019-09-04 DIAGNOSIS — L97521 Non-pressure chronic ulcer of other part of left foot limited to breakdown of skin: Secondary | ICD-10-CM

## 2019-09-04 DIAGNOSIS — I739 Peripheral vascular disease, unspecified: Secondary | ICD-10-CM

## 2019-09-04 NOTE — Progress Notes (Signed)
Subjective: Christina [name] is a 79 y.o. female patient who returns to office for follow up evaluation of Left foot ulceration, reports that her husband has been helping to change like before using Prisma and reports that there is less bleeding.  Denies constitutional symptoms or any other pedal complaints at this time. Patient Active Problem List   Diagnosis Date Noted  . Cervical spondylosis without myelopathy 08/23/2017  . Meningioma ([place]) 08/23/2017  . 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 Chronic seasonal allergic rhinitis 05/07/2016  . Tinnitus, bilateral 05/07/2016  . ETD (Eustachian tube dysfunction), bilateral 02/05/2016  . Otorrhagia of left ear 02/05/2016  . Atypical chest pain   . PAF (paroxysmal atrial fibrillation) ([place])   . Precordial pain   . Palpitations 09/16/2015  . Paroxysmal atrial fibrillation ([place]) 08/03/2010  . Hypertension 08/03/2010  . History of breast cancer 08/03/2010  . History of thyroid cancer 08/03/2010  . History of melanoma 08/03/2010  . Tarsal tunnel syndrome 08/03/2010    Current Outpatient Medications on File Prior to Visit  Medication Sig Dispense Refill  . acetaminophen (TYLENOL) 500 MG tablet Take 500-1,000 mg by mouth daily as needed for mild pain or headache.    Marland Kitchen apixaban (ELIQUIS) 5 MG TABS tablet Take 1 tablet (5 mg total) by mouth 2 (two) times daily. 180 tablet 2  . Calcium Citrate (CITRACAL PO) Take 2 tablets by mouth 2 (two) times daily.    . cetirizine (ZYRTEC) 10 MG tablet Take by mouth.    . clindamycin (CLEOCIN) 300 MG capsule Take 1 capsule (300 mg total) by mouth 3 (three) times daily. 30 capsule 0  . diltiazem (CARDIZEM CD) 120 MG 24 hr capsule TAKE ONE CAPSULE BY MOUTH DAILY 90 capsule 1  . ezetimibe (ZETIA) 10 MG tablet Take 10 mg by mouth daily.    . fish oil-omega-3 fatty acids 1000 MG capsule Take 2 g by mouth daily.     . fluticasone (FLONASE) 50 MCG/ACT nasal spray Place 2 sprays into  the nose daily.    Marland Kitchen galantamine (RAZADYNE ER) 16 MG 24 hr capsule Take 16 mg by mouth daily.    Marland Kitchen ipratropium (ATROVENT) 0.03 % nasal spray Place 2 sprays into the nose 2 (two) times daily as needed for rhinitis.     Marland Kitchen levothyroxine (SYNTHROID, LEVOTHROID) 150 MCG tablet Take 150 mcg by mouth daily before breakfast. 150 mcg Mon - Sat 37.5 mcg on Sunday    . Misc Natural Products (GLUCOSAMINE CHOND CMP TRIPLE) TABS Take by mouth.    . Multiple Vitamins-Minerals (PRESERVISION AREDS) CAPS Take by mouth.    . nebivolol (BYSTOLIC) 5 MG tablet TAKE 1 TABLET BY MOUTH DAILY    . omeprazole (PRILOSEC) 20 MG capsule Take 20 mg by mouth daily.    . potassium chloride SA (K-DUR,KLOR-CON) 20 MEQ tablet Take 40 mEq by mouth daily.     . Probiotic Product (ALIGN PO) Take 1 tablet by mouth daily.    . simvastatin (ZOCOR) 20 MG tablet Take 20 mg by mouth at bedtime.    Marland Kitchen SUTAB [phone] MG TABS     . triamterene-hydrochlorothiazide (MAXZIDE) 75-50 MG tablet Take 1 tablet by mouth daily.      No current facility-administered medications on file prior to visit.    Allergies  Allergen Reactions  . Carbamazepine Hives, Other (See Comments) and Rash  . Oxaprozin Hives, Other (See Comments) and Rash  . Amoxicillin-Pot Clavulanate Diarrhea and Nausea Only  . Prednisolone   .  Prednisone Itching and Other (See Comments)    leg swelling /dyuria     Objective:  General: Alert and oriented x3 in no acute distress  Dermatology: There is a partial thickness ulceration present plantar left foot that measured 0.5 x 0.4 cm with keratotic border and a granular base with minimal bloody drainage, dirt on the bottom of the foot less than previous, no odor, mild swelling no erythema, all nails x 10 are short and thick with left 1st and 2nd toenails most involved.    Vascular: Dorsalis Pedis 1/4 and Posterior Tibial pedal pulses 0/4, Capillary Fill Time 3 seconds, + pedal hair growth bilateral, 1+ pitting edema  bilateral lower extremities, Temperature gradient within normal limits.  Neurology: Protective sensation absent secondary to history of neuropathy.  Musculoskeletal: Mild tenderness with palpation at the ulcer left foot, Muscular strength 5/5 in all groups without pain or limitation on range of motion.Hammertoe and prominent 5th met head bilateral L>R.   Assessment and Plan: Problem List Items Addressed This Visit    None    Visit Diagnoses    Foot ulcer, left, limited to breakdown of skin ([place])    -  Primary   PAD (peripheral artery disease) ([place])       PVD (peripheral vascular disease) ([place])       Neuropathy          -Complete examination performed -Discussed treatment plan for left foot ulcer -Excisionally dedbrided ulceration at left plantar forefoot to healthy bleeding borders removing nonviable tissue using a sterile chisel blade. Wound measures post debridement as above. Wound was debrided to the level of the dermis with viable wound base exposed to promote healing. Hemostasis was achieved with manuel pressure. Patient tolerated procedure well without any discomfort or anesthesia necessary for this wound debridement.  -Applied Prisma and dry sterile dressing and instructed patient to continue with daily dressings at home consisting of the same daily; gave patient some supplies Prisma as well as gauze padding and Coban to help better address the left foot ulceration daily as instructed again this visit -Continue with surgical shoe and applied additional offloading padding to shoe again this visit -Patient is awaiting vascular follow-up - Advised patient to go to the ER or return to office if the wound worsens or if constitutional symptoms are present. -Patient to return in 3 weeks for follow-up evaluation and possible discussion of skin graft if vascular findings are negative.  Landis Martins, DPM

## 2019-09-18 ENCOUNTER — Ambulatory Visit (HOSPITAL_COMMUNITY)
Admission: RE | Admit: 2019-09-18 | Payer: Medicare Other | Source: Ambulatory Visit | Attending: Sports Medicine | Admitting: Sports Medicine

## 2019-09-18 ENCOUNTER — Ambulatory Visit: Payer: Medicare Other | Admitting: Sports Medicine

## 2019-09-20 DIAGNOSIS — M6281 Muscle weakness (generalized): Secondary | ICD-10-CM | POA: Diagnosis not present

## 2019-09-20 DIAGNOSIS — M62838 Other muscle spasm: Secondary | ICD-10-CM | POA: Diagnosis not present

## 2019-09-20 DIAGNOSIS — R151 Fecal smearing: Secondary | ICD-10-CM | POA: Diagnosis not present

## 2019-09-20 DIAGNOSIS — M6289 Other specified disorders of muscle: Secondary | ICD-10-CM | POA: Diagnosis not present

## 2019-09-21 ENCOUNTER — Ambulatory Visit (HOSPITAL_COMMUNITY): Payer: Medicare Other

## 2019-09-26 DIAGNOSIS — N993 Prolapse of vaginal vault after hysterectomy: Secondary | ICD-10-CM | POA: Insufficient documentation

## 2019-09-26 DIAGNOSIS — N952 Postmenopausal atrophic vaginitis: Secondary | ICD-10-CM | POA: Diagnosis not present

## 2019-09-26 DIAGNOSIS — K469 Unspecified abdominal hernia without obstruction or gangrene: Secondary | ICD-10-CM | POA: Diagnosis not present

## 2019-09-27 ENCOUNTER — Ambulatory Visit: Payer: Medicare Other | Admitting: Sports Medicine

## 2019-09-27 ENCOUNTER — Encounter: Payer: Self-pay | Admitting: Sports Medicine

## 2019-09-27 ENCOUNTER — Other Ambulatory Visit: Payer: Self-pay

## 2019-09-27 DIAGNOSIS — L97521 Non-pressure chronic ulcer of other part of left foot limited to breakdown of skin: Secondary | ICD-10-CM | POA: Diagnosis not present

## 2019-09-27 DIAGNOSIS — G629 Polyneuropathy, unspecified: Secondary | ICD-10-CM

## 2019-09-27 DIAGNOSIS — M79672 Pain in left foot: Secondary | ICD-10-CM

## 2019-09-27 DIAGNOSIS — I739 Peripheral vascular disease, unspecified: Secondary | ICD-10-CM

## 2019-09-27 NOTE — Progress Notes (Signed)
Subjective: Christina [name] is a 79 y.o. female patient who returns to office for follow up evaluation of Left foot ulceration, reports that her husband has been helping to change like before using Prisma and reports that there is bleeding still when she walks to the bathroom. She reports she didn't go to her vascular appt last week.  Denies constitutional symptoms or any other pedal complaints at this time.  Patient Active Problem List   Diagnosis Date Noted  . 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 Cervical spondylosis without myelopathy 08/23/2017  . Meningioma ([place]) 08/23/2017  . 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 Chronic seasonal allergic rhinitis 05/07/2016  . Tinnitus, bilateral 05/07/2016  . ETD (Eustachian tube dysfunction), bilateral 02/05/2016  . Otorrhagia of left ear 02/05/2016  . Atypical chest pain   . PAF (paroxysmal atrial fibrillation) ([place])   . Precordial pain   . Palpitations 09/16/2015  . Paroxysmal atrial fibrillation ([place]) 08/03/2010  . Hypertension 08/03/2010  . History of breast cancer 08/03/2010  . History of thyroid cancer 08/03/2010  . History of melanoma 08/03/2010  . Tarsal tunnel syndrome 08/03/2010    Current Outpatient Medications on File Prior to Visit  Medication Sig Dispense Refill  . acetaminophen (TYLENOL) 500 MG tablet Take 500-1,000 mg by mouth daily as needed for mild pain or headache.    Marland Kitchen apixaban (ELIQUIS) 5 MG TABS tablet Take 1 tablet (5 mg total) by mouth 2 (two) times daily. 180 tablet 2  . Calcium Citrate (CITRACAL PO) Take 2 tablets by mouth 2 (two) times daily.    . cetirizine (ZYRTEC) 10 MG tablet Take by mouth.    . clindamycin (CLEOCIN) 300 MG capsule Take 1 capsule (300 mg total) by mouth 3 (three) times daily. 30 capsule 0  . diltiazem (CARDIZEM CD) 120 MG 24 hr capsule TAKE ONE CAPSULE BY MOUTH DAILY 90 capsule 1  . ezetimibe (ZETIA) 10 MG tablet  Take 10 mg by mouth daily.    . fish oil-omega-3 fatty acids 1000 MG capsule Take 2 g by mouth daily.     . fluticasone (FLONASE) 50 MCG/ACT nasal spray Place 2 sprays into the nose daily.    Marland Kitchen galantamine (RAZADYNE ER) 16 MG 24 hr capsule Take 16 mg by mouth daily.    Marland Kitchen ipratropium (ATROVENT) 0.03 % nasal spray Place 2 sprays into the nose 2 (two) times daily as needed for rhinitis.     Marland Kitchen levothyroxine (SYNTHROID, LEVOTHROID) 150 MCG tablet Take 150 mcg by mouth daily before breakfast. 150 mcg Mon - Sat 37.5 mcg on Sunday    . Misc Natural Products (GLUCOSAMINE CHOND CMP TRIPLE) TABS Take by mouth.    . Multiple Vitamins-Minerals (PRESERVISION AREDS) CAPS Take by mouth.    . nebivolol (BYSTOLIC) 5 MG tablet TAKE 1 TABLET BY MOUTH DAILY    . omeprazole (PRILOSEC) 20 MG capsule Take 20 mg by mouth daily.    . potassium chloride SA (K-DUR,KLOR-CON) 20 MEQ tablet Take 40 mEq by mouth daily.     . Probiotic Product (ALIGN PO) Take 1 tablet by mouth daily.    . simvastatin (ZOCOR) 20 MG tablet Take 20 mg by mouth at bedtime.    Marland Kitchen SUTAB [phone] MG TABS     . triamterene-hydrochlorothiazide (MAXZIDE) 75-50 MG tablet Take 1 tablet by mouth daily.      No current facility-administered medications on file prior to visit.    Allergies  Allergen  Reactions  . Carbamazepine Hives, Other (See Comments) and Rash  . Oxaprozin Hives, Other (See Comments) and Rash  . Amoxicillin-Pot Clavulanate Diarrhea and Nausea Only  . Prednisolone   . Prednisone Itching and Other (See Comments)    leg swelling /dyuria     Objective:  General: Alert and oriented x3 in no acute distress  Dermatology: There is a partial thickness ulceration present plantar left foot that measured 0.8 x 0.5cm with keratotic border and a granular base with minimal bloody drainage, dirt on the bottom of the foot less than previous, no odor, mild swelling no erythema, all nails x 10 are short and thick with left 1st and 2nd toenails  most involved like before.    Vascular: Dorsalis Pedis 1/4 and Posterior Tibial pedal pulses 0/4, Capillary Fill Time 3 seconds, + pedal hair growth bilateral, 1+ pitting edema bilateral lower extremities, Temperature gradient within normal limits.  Neurology: Protective sensation absent secondary to history of neuropathy.  Musculoskeletal: Mild tenderness with palpation at the ulcer left foot, Muscular strength 5/5 in all groups without pain or limitation on range of motion.Hammertoe and prominent 5th met head bilateral L>R.   Assessment and Plan: Problem List Items Addressed This Visit    None    Visit Diagnoses    Foot ulcer, left, limited to breakdown of skin ([place])    -  Primary   PAD (peripheral artery disease) ([place])       PVD (peripheral vascular disease) ([place])       Neuropathy       Left foot pain         -Complete examination performed -Discussed treatment plan for left foot ulcer -Excisionally dedbrided ulceration at left plantar forefoot to healthy bleeding borders removing nonviable tissue using a sterile chisel blade. Wound measures post debridement as above. Wound was debrided to the level of the dermis with viable wound base exposed to promote healing. Hemostasis was achieved with manuel pressure. Patient tolerated procedure well without any discomfort or anesthesia necessary for this wound debridement.  -Applied Prisma and dry sterile dressing and instructed patient to continue with daily dressings at home consisting of the same daily; gave patient some supplies Prisma as well as gauze padding and Coban to help better address the left foot ulceration daily as instructed again this visit like before -Continue with surgical shoe and applied additional offloading padding to shoe again this visit -Patient is awaiting vascular follow-up, has appt on 8/23 - Advised patient to go to the ER or return to office if the wound worsens or if constitutional symptoms are present. -Patient  to return in 3 weeks for follow-up evaluation.  Landis Martins, DPM

## 2019-10-04 ENCOUNTER — Other Ambulatory Visit: Payer: Self-pay

## 2019-10-04 ENCOUNTER — Encounter (HOSPITAL_COMMUNITY): Payer: Self-pay | Admitting: *Deleted

## 2019-10-04 ENCOUNTER — Emergency Department (HOSPITAL_COMMUNITY)
Admission: EM | Admit: 2019-10-04 | Discharge: 2019-10-04 | Disposition: A | Discharge status: Home / Self Care | Payer: Medicare Other | Attending: Emergency Medicine | Admitting: Emergency Medicine

## 2019-10-04 DIAGNOSIS — Z951 Presence of aortocoronary bypass graft: Secondary | ICD-10-CM | POA: Insufficient documentation

## 2019-10-04 DIAGNOSIS — E039 Hypothyroidism, unspecified: Secondary | ICD-10-CM | POA: Diagnosis not present

## 2019-10-04 DIAGNOSIS — Z79899 Other long term (current) drug therapy: Secondary | ICD-10-CM | POA: Diagnosis not present

## 2019-10-04 DIAGNOSIS — I10 Essential (primary) hypertension: Secondary | ICD-10-CM | POA: Diagnosis not present

## 2019-10-04 DIAGNOSIS — Z85850 Personal history of malignant neoplasm of thyroid: Secondary | ICD-10-CM | POA: Insufficient documentation

## 2019-10-04 DIAGNOSIS — R009 Unspecified abnormalities of heart beat: Secondary | ICD-10-CM | POA: Diagnosis not present

## 2019-10-04 DIAGNOSIS — R040 Epistaxis: Secondary | ICD-10-CM | POA: Insufficient documentation

## 2019-10-04 DIAGNOSIS — Z853 Personal history of malignant neoplasm of breast: Secondary | ICD-10-CM | POA: Diagnosis not present

## 2019-10-04 DIAGNOSIS — Z7901 Long term (current) use of anticoagulants: Secondary | ICD-10-CM | POA: Diagnosis not present

## 2019-10-04 DIAGNOSIS — Z79890 Hormone replacement therapy (postmenopausal): Secondary | ICD-10-CM | POA: Diagnosis not present

## 2019-10-04 LAB — CBC
HCT: 48.3 % — ABNORMAL HIGH (ref 36.0–46.0)
Hemoglobin: 15.1 g/dL — ABNORMAL HIGH (ref 12.0–15.0)
MCH: 28.4 pg (ref 26.0–34.0)
MCHC: 31.3 g/dL (ref 30.0–36.0)
MCV: 91 fL (ref 80.0–100.0)
Platelets: 228 10*3/uL (ref 150–400)
RBC: 5.31 MIL/uL — ABNORMAL HIGH (ref 3.87–5.11)
RDW: 14.4 % (ref 11.5–15.5)
WBC: 7.8 10*3/uL (ref 4.0–10.5)
nRBC: 0 % (ref 0.0–0.2)

## 2019-10-04 LAB — PROTIME-INR
INR: 1.1 (ref 0.8–1.2)
Prothrombin Time: 13.4 seconds (ref 11.4–15.2)

## 2019-10-04 NOTE — Discharge Instructions (Addendum)
Your blood work is normal.  You PCP should have access to your labs to make any adjustments to your Eliquis.  You can take your dose of Eliquis tonight.  If you have any further bleeding please call your PCP or follow up at urgent or emergent care.

## 2019-10-04 NOTE — ED Notes (Signed)
Verbalized understanding of DC instructions and follow up care 

## 2019-10-04 NOTE — ED Triage Notes (Addendum)
Pt reports having nosebleed this am. Reports it was moderate amount of blood and noted small clot. No hx of nosebleeds. She is on xarelto. Bleeding is controlled pta. No acute distress noted at triage.

## 2019-10-04 NOTE — ED Provider Notes (Signed)
[facility] EMERGENCY DEPARTMENT Provider Note   CSN: [id number] Arrival date & time: 10/04/19  5701     History Chief Complaint  Patient presents with  . Epistaxis    Christina [name] is a 79 y.o. female.  Patient reports nose bleed that started this morning at 0830 and resolved at 0915.  She is taking Eliquis 5 mg BID and reports that she called her PCP her recommended she come to the ED for blood work and possible adjustment of medication. Last taken yesterday evening.  Patient reports that there was a lot of nasal bleeding without clots.  Denies any dizziness, headaches or visual disturbances. Reports no chest pain, shortness of breath, hematuria or bloody stool. Does not currently have active bleeding and does not feel like anything is draining in back of throat. Her husband reports that she has been outside a lot these past couple of day in the heat.  Reports no recent trauma.  The history is provided by the patient and the spouse.  Epistaxis Associated symptoms: no cough, no dizziness and no fever        Past Medical History:  Diagnosis Date  . Atypical chest pain    a. 08/2015 Cath: LM nl, LAD 20ost, D1 small/nl, D2 mod, nl, RI small, nl, LCX nl, OM1 small, nl, OM2 mod nl, RCA 58m, EF 55-65%.  . Breast cancer ([place])    H/O LEFT MASTECTOMY IN 2000  . Diverticulitis   . Edema   . Esophageal reflux   . Female bladder prolapse   . H/O: hysterectomy   . Hyperlipidemia   . Hypertension   . Hypokalemia   . Hypothyroidism   . Irregular heartbeat   . Melanoma ([place]) 1991   REMOVED FROM LEFT LEG IN 1991  . Neuropathy   . 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 PAF (paroxysmal atrial fibrillation) ([place])    a. CHA2DS2VASc = 3-->Xarelto since 2015;  b. 08/2015 48h holter in setting of palpitations-->PVC's.  . Palpitations    a. 08/2015 48h Holter: pvc's.  . 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 Post-menopausal   . PVCs (premature ventricular contractions)   .  Shingles   . Thyroid cancer ([place])    s/p THYROIDECTOMY  . Whooping cough    as a child    Patient Active Problem List   Diagnosis Date Noted  . 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 Cervical spondylosis without myelopathy 08/23/2017  . Meningioma ([place]) 08/23/2017  . 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 Chronic seasonal allergic rhinitis 05/07/2016  . Tinnitus, bilateral 05/07/2016  . ETD (Eustachian tube dysfunction), bilateral 02/05/2016  . Otorrhagia of left ear 02/05/2016  . Atypical chest pain   . PAF (paroxysmal atrial fibrillation) ([place])   . Precordial pain   . Palpitations 09/16/2015  . Paroxysmal atrial fibrillation ([place]) 08/03/2010  . Hypertension 08/03/2010  . History of breast cancer 08/03/2010  . History of thyroid cancer 08/03/2010  . History of melanoma 08/03/2010  . Tarsal tunnel syndrome 08/03/2010    Past Surgical History:  Procedure Laterality Date  .  tarsal tunnel release  2002   Right/Left  tarsal tunnel release  . CARDIAC CATHETERIZATION  04/21/07   GLOBAL ESTIMATED EF IS 60 %; NORMAL LV FUNCTION; NORMAL CORONARY ARTERIES; BORDERLINE INCREASE IN THE AORTIC ROOT  . CARDIAC CATHETERIZATION N/A 09/18/2015   Procedure: Left Heart Cath and Coronary Angiography;  Surgeon: Annita Brod  Angelena Form, MD;  Location: [place] [facility];  Service: Cardiovascular;  Laterality: N/A;  . COLONOSCOPY    . CYSTOSCOPY  2007   anterior repair of cystocele with mesh  . MASTECTOMY  2000   LEFT BREAST  . MELANOMA EXCISION  1991   REMOVED FROM LEFT LEG  . NASAL SINUS SURGERY    . THYROIDECTOMY       OB History   No obstetric history on file.     Family History  Problem Relation Age of Onset  . Pancreatitis Mother   . Heart disease Mother   . Kidney disease Mother   . Hypertension Father   . Macular degeneration Father   . Heart disease Father   . Breast cancer Neg Hx   . Colon  cancer Neg Hx   . Esophageal cancer Neg Hx   . Pancreatic cancer Neg Hx   . Stomach cancer Neg Hx     Social History   Tobacco Use  . Smoking status: Never Smoker  . Smokeless tobacco: Never Used  Vaping Use  . Vaping Use: Never used  Substance Use Topics  . Alcohol use: Yes    Comment: RARE  . Drug use: No    Home Medications Prior to Admission medications   Medication Sig Start Date End Date Taking? Authorizing Provider  acetaminophen (TYLENOL) 500 MG tablet Take 500-1,000 mg by mouth daily as needed for mild pain or headache.    [provider]  apixaban (ELIQUIS) 5 MG TABS tablet Take 1 tablet (5 mg total) by mouth 2 (two) times daily. 08/30/19   Burnell Blanks, MD  Calcium Citrate (CITRACAL PO) Take 2 tablets by mouth 2 (two) times daily.    [provider]  cetirizine (ZYRTEC) 10 MG tablet Take by mouth.    [provider]  clindamycin (CLEOCIN) 300 MG capsule Take 1 capsule (300 mg total) by mouth 3 (three) times daily. 07/31/19   Landis Martins, DPM  diltiazem (CARDIZEM CD) 120 MG 24 hr capsule TAKE ONE CAPSULE BY MOUTH DAILY 09/22/16   Burnell Blanks, MD  ezetimibe (ZETIA) 10 MG tablet Take 10 mg by mouth daily. 04/14/19   [provider]  fish oil-omega-3 fatty acids 1000 MG capsule Take 2 g by mouth daily.     [provider]  fluticasone (FLONASE) 50 MCG/ACT nasal spray Place 2 sprays into the nose daily.    [provider]  galantamine (RAZADYNE ER) 16 MG 24 hr capsule Take 16 mg by mouth daily. 07/27/19   [provider]  ipratropium (ATROVENT) 0.03 % nasal spray Place 2 sprays into the nose 2 (two) times daily as needed for rhinitis.     [provider]  levothyroxine (SYNTHROID, LEVOTHROID) 150 MCG tablet Take 150 mcg by mouth daily before breakfast. 150 mcg Mon - Sat 37.5 mcg on Sunday    [provider]  Misc Natural Products (GLUCOSAMINE CHOND CMP TRIPLE) TABS Take by  mouth.    [provider]  Multiple Vitamins-Minerals (PRESERVISION AREDS) CAPS Take by mouth.    [provider]  nebivolol (BYSTOLIC) 5 MG tablet TAKE 1 TABLET BY MOUTH DAILY    [provider]  omeprazole (PRILOSEC) 20 MG capsule Take 20 mg by mouth daily.    [provider]  potassium chloride SA (K-DUR,KLOR-CON) 20 MEQ tablet Take 40 mEq by mouth daily.     [provider]  Probiotic Product (ALIGN PO) Take 1 tablet by mouth  daily.    [provider]  simvastatin (ZOCOR) 20 MG tablet Take 20 mg by mouth at bedtime. 06/04/19   [provider]  SUTAB [phone] MG TABS  07/03/19   [provider]  triamterene-hydrochlorothiazide (MAXZIDE) 75-50 MG tablet Take 1 tablet by mouth daily.  03/12/19   [provider]    Allergies    Carbamazepine, Oxaprozin, Amoxicillin-pot clavulanate, Prednisolone, and Prednisone  Review of Systems   Review of Systems  Constitutional: Negative for chills and fever.  HENT: Positive for nosebleeds.   Eyes: Negative for visual disturbance.  Respiratory: Negative for cough and shortness of breath.   Cardiovascular: Negative for chest pain.  Gastrointestinal: Negative for blood in stool, nausea and vomiting.  Genitourinary: Negative for hematuria.  Neurological: Negative for dizziness, syncope, weakness and light-headedness.    Physical Exam Updated Vital Signs BP 133/76   Pulse (!) 46   Temp (!) 97.5 F (36.4 C)   Resp 20   Ht 5\' 10"  (1.778 m)   Wt 59.9 kg   SpO2 100%   BMI 18.94 kg/m   Physical Exam Constitutional:      Appearance: Normal appearance.  HENT:     Head: Normocephalic.     Nose: Nose normal.     Comments: No active bleeding, nares patent    Mouth/Throat:     Mouth: Mucous membranes are moist.  Eyes:     Extraocular Movements: Extraocular movements intact.     Pupils: Pupils are equal, round, and reactive to light.  Cardiovascular:     Rate and  Rhythm: Normal rate. Rhythm irregular.     Pulses: Normal pulses.     Heart sounds: Normal heart sounds.  Pulmonary:     Effort: Pulmonary effort is normal.     Breath sounds: Normal breath sounds.  Abdominal:     General: Abdomen is flat. Bowel sounds are normal.     Palpations: Abdomen is soft.  Musculoskeletal:     Cervical back: Normal range of motion.  Skin:    General: Skin is warm.     Capillary Refill: Capillary refill takes less than 2 seconds.  Neurological:     Mental Status: She is alert and oriented to person, place, and time.     ED Results / Procedures / Treatments   Labs (all labs ordered are listed, but only abnormal results are displayed) Labs Reviewed  CBC - Abnormal; Notable for the following components:      Result Value   RBC 5.31 (*)    Hemoglobin 15.1 (*)    HCT 48.3 (*)    All other components within normal limits  PROTIME-INR    EKG None  Radiology No results found.  Procedures Procedures (including critical care time)  Medications Ordered in ED Medications - No data to display  ED Course  I have reviewed the triage vital signs and the nursing notes.  Pertinent labs & imaging results that were available during my care of the patient were reviewed by me and considered in my medical decision making (see chart for details).    MDM Rules/Calculators/A&P                          ALISSAH REDMON is a 79 y.o. female who presented to the ED for epistaxis. She is currently on Eliquis 5 mg BID for A.fib.  Hbg stable at 15.1, PT 13.4 and INR 1.1.  Bleeding has stopped since 915am  today.  On exam nares patent, no active bleeding observed.  Posterior pharyngeal wall shows no evidence of active bleeding.  Given that epistaxis has resolved will discharge home and patient will follow up with PCP in am.  OK to continue Eliquis given history of Afib.  Patient instructed to return to Urgent care or ED if bleeding recurs.  6:16 PM   Final Clinical  Impression(s) / ED Diagnoses Final diagnoses:  Epistaxis    Rx / DC Orders ED Discharge Orders    None       Carollee Leitz, MD 10/04/19 Mallie Mussel    Lajean Saver, MD 10/04/19 2256

## 2019-10-05 ENCOUNTER — Ambulatory Visit
Admission: RE | Admit: 2019-10-05 | Discharge: 2019-10-05 | Disposition: A | Discharge status: Home / Self Care | Payer: Medicare Other | Source: Ambulatory Visit | Attending: Internal Medicine | Admitting: Internal Medicine

## 2019-10-05 DIAGNOSIS — Z1231 Encounter for screening mammogram for malignant neoplasm of breast: Secondary | ICD-10-CM

## 2019-10-08 DIAGNOSIS — H353211 Exudative age-related macular degeneration, right eye, with active choroidal neovascularization: Secondary | ICD-10-CM | POA: Diagnosis not present

## 2019-10-09 ENCOUNTER — Other Ambulatory Visit: Payer: Self-pay | Admitting: Internal Medicine

## 2019-10-09 DIAGNOSIS — R928 Other abnormal and inconclusive findings on diagnostic imaging of breast: Secondary | ICD-10-CM

## 2019-10-15 ENCOUNTER — Other Ambulatory Visit: Payer: Self-pay

## 2019-10-15 ENCOUNTER — Other Ambulatory Visit: Payer: Self-pay | Admitting: Internal Medicine

## 2019-10-15 ENCOUNTER — Ambulatory Visit (HOSPITAL_COMMUNITY)
Admission: RE | Admit: 2019-10-15 | Discharge: 2019-10-15 | Disposition: A | Discharge status: Home / Self Care | Payer: Medicare Other | Source: Ambulatory Visit | Attending: Cardiology | Admitting: Cardiology

## 2019-10-15 ENCOUNTER — Ambulatory Visit
Admission: RE | Admit: 2019-10-15 | Discharge: 2019-10-15 | Disposition: A | Discharge status: Home / Self Care | Payer: Medicare Other | Source: Ambulatory Visit | Attending: Internal Medicine | Admitting: Internal Medicine

## 2019-10-15 DIAGNOSIS — Z853 Personal history of malignant neoplasm of breast: Secondary | ICD-10-CM | POA: Diagnosis not present

## 2019-10-15 DIAGNOSIS — R928 Other abnormal and inconclusive findings on diagnostic imaging of breast: Secondary | ICD-10-CM

## 2019-10-15 DIAGNOSIS — N6315 Unspecified lump in the right breast, overlapping quadrants: Secondary | ICD-10-CM | POA: Diagnosis not present

## 2019-10-15 DIAGNOSIS — L97901 Non-pressure chronic ulcer of unspecified part of unspecified lower leg limited to breakdown of skin: Secondary | ICD-10-CM | POA: Diagnosis not present

## 2019-10-15 DIAGNOSIS — L97421 Non-pressure chronic ulcer of left heel and midfoot limited to breakdown of skin: Secondary | ICD-10-CM

## 2019-10-18 ENCOUNTER — Ambulatory Visit: Payer: Medicare Other | Admitting: Sports Medicine

## 2019-10-22 ENCOUNTER — Other Ambulatory Visit: Payer: Self-pay

## 2019-10-22 ENCOUNTER — Ambulatory Visit
Admission: RE | Admit: 2019-10-22 | Discharge: 2019-10-22 | Disposition: A | Discharge status: Home / Self Care | Payer: Medicare Other | Source: Ambulatory Visit | Attending: Internal Medicine | Admitting: Internal Medicine

## 2019-10-22 DIAGNOSIS — N6312 Unspecified lump in the right breast, upper inner quadrant: Secondary | ICD-10-CM | POA: Diagnosis not present

## 2019-10-22 DIAGNOSIS — D241 Benign neoplasm of right breast: Secondary | ICD-10-CM | POA: Diagnosis not present

## 2019-10-22 DIAGNOSIS — R928 Other abnormal and inconclusive findings on diagnostic imaging of breast: Secondary | ICD-10-CM

## 2019-10-22 DIAGNOSIS — N6315 Unspecified lump in the right breast, overlapping quadrants: Secondary | ICD-10-CM | POA: Diagnosis not present

## 2019-10-24 ENCOUNTER — Ambulatory Visit (INDEPENDENT_AMBULATORY_CARE_PROVIDER_SITE_OTHER): Payer: Medicare Other

## 2019-10-24 ENCOUNTER — Ambulatory Visit (INDEPENDENT_AMBULATORY_CARE_PROVIDER_SITE_OTHER): Payer: Medicare Other | Admitting: Podiatry

## 2019-10-24 ENCOUNTER — Other Ambulatory Visit: Payer: Self-pay

## 2019-10-24 DIAGNOSIS — I739 Peripheral vascular disease, unspecified: Secondary | ICD-10-CM | POA: Diagnosis not present

## 2019-10-24 DIAGNOSIS — I999 Unspecified disorder of circulatory system: Secondary | ICD-10-CM

## 2019-10-24 DIAGNOSIS — L97522 Non-pressure chronic ulcer of other part of left foot with fat layer exposed: Secondary | ICD-10-CM

## 2019-10-24 MED ORDER — DOXYCYCLINE HYCLATE 100 MG PO TABS
100.0000 mg | ORAL_TABLET | Freq: Two times a day (BID) | ORAL | 0 refills | Status: AC
Start: 2019-10-24 — End: 2019-11-07

## 2019-10-26 ENCOUNTER — Encounter: Payer: Self-pay | Admitting: Podiatry

## 2019-10-26 NOTE — Progress Notes (Signed)
Subjective:  Patient ID: Christina [name], female    DOB: 1940/06/17,  MRN: [id number]  Chief Complaint  Patient presents with  . Wound Check    Pt states she is not doing better- she has swellingnow- and discomfort- states no improvement     79 y.o. female presents for wound care.  Patient presents with left submetatarsal 4 ulceration with fat layer exposed probing down to deep tissue including the bone.  Patient states that she is being treated by Dr. Cannon Kettle but it appears the wound has become more chronic and has not been getting any better.  It appears the wound is regressing is getting more deeper.  Patient is not a diabetic.  She denies any other acute complaints.  She would like to discuss treatment options for this.  She had an ABIs PVRs done recently which showed good vascular flow down to the lower extremity however the wound is worsening and I am worried that she may have microvascular disease.   Review of Systems: Negative except as noted in the HPI. Denies N/V/F/Ch.  Past Medical History:  Diagnosis Date  . Atypical chest pain    a. 08/2015 Cath: LM nl, LAD 20ost, D1 small/nl, D2 mod, nl, RI small, nl, LCX nl, OM1 small, nl, OM2 mod nl, RCA 77m, EF 55-65%.  . Breast cancer ([place])    H/O LEFT MASTECTOMY IN 2000  . Diverticulitis   . Edema   . Esophageal reflux   . Female bladder prolapse   . H/O: hysterectomy   . Hyperlipidemia   . Hypertension   . Hypokalemia   . Hypothyroidism   . Irregular heartbeat   . Melanoma ([place]) 1991   REMOVED FROM LEFT LEG IN 1991  . Neuropathy   . 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 PAF (paroxysmal atrial fibrillation) ([place])    a. CHA2DS2VASc = 3-->Xarelto since 2015;  b. 08/2015 48h holter in setting of palpitations-->PVC's.  . Palpitations    a. 08/2015 48h Holter: pvc's.  . 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 Post-menopausal   . PVCs (premature ventricular contractions)   . Shingles   . Thyroid cancer ([place])    s/p THYROIDECTOMY   . Whooping cough    as a child    Current Outpatient Medications:  .  acetaminophen (TYLENOL) 500 MG tablet, Take 500-1,000 mg by mouth daily as needed for mild pain or headache., Disp: , Rfl:  .  apixaban (ELIQUIS) 5 MG TABS tablet, Take 1 tablet (5 mg total) by mouth 2 (two) times daily., Disp: 180 tablet, Rfl: 2 .  Calcium Citrate (CITRACAL PO), Take 2 tablets by mouth 2 (two) times daily., Disp: , Rfl:  .  cetirizine (ZYRTEC) 10 MG tablet, Take by mouth., Disp: , Rfl:  .  clindamycin (CLEOCIN) 300 MG capsule, Take 1 capsule (300 mg total) by mouth 3 (three) times daily., Disp: 30 capsule, Rfl: 0 .  diltiazem (CARDIZEM CD) 120 MG 24 hr capsule, TAKE ONE CAPSULE BY MOUTH DAILY, Disp: 90 capsule, Rfl: 1 .  doxycycline (VIBRA-TABS) 100 MG tablet, Take 1 tablet (100 mg total) by mouth 2 (two) times daily for 14 days., Disp: 28 tablet, Rfl: 0 .  ezetimibe (ZETIA) 10 MG tablet, Take 10 mg by mouth daily., Disp: , Rfl:  .  fish oil-omega-3 fatty acids 1000 MG capsule, Take 2 g by mouth daily. , Disp: , Rfl:  .  fluticasone (FLONASE) 50 MCG/ACT nasal spray, Place 2 sprays into the nose  daily., Disp: , Rfl:  .  galantamine (RAZADYNE ER) 16 MG 24 hr capsule, Take 16 mg by mouth daily., Disp: , Rfl:  .  ipratropium (ATROVENT) 0.03 % nasal spray, Place 2 sprays into the nose 2 (two) times daily as needed for rhinitis. , Disp: , Rfl:  .  levothyroxine (SYNTHROID, LEVOTHROID) 150 MCG tablet, Take 150 mcg by mouth daily before breakfast. 150 mcg Mon - Sat 37.5 mcg on Sunday, Disp: , Rfl:  .  Misc Natural Products (GLUCOSAMINE CHOND CMP TRIPLE) TABS, Take by mouth., Disp: , Rfl:  .  Multiple Vitamins-Minerals (PRESERVISION AREDS) CAPS, Take by mouth., Disp: , Rfl:  .  nebivolol (BYSTOLIC) 5 MG tablet, TAKE 1 TABLET BY MOUTH DAILY, Disp: , Rfl:  .  omeprazole (PRILOSEC) 20 MG capsule, Take 20 mg by mouth daily., Disp: , Rfl:  .  potassium chloride SA (K-DUR,KLOR-CON) 20 MEQ tablet, Take 40 mEq by mouth  daily. , Disp: , Rfl:  .  Probiotic Product (ALIGN PO), Take 1 tablet by mouth daily., Disp: , Rfl:  .  simvastatin (ZOCOR) 20 MG tablet, Take 20 mg by mouth at bedtime., Disp: , Rfl:  .  SUTAB [phone] MG TABS, , Disp: , Rfl:  .  triamterene-hydrochlorothiazide (MAXZIDE) 75-50 MG tablet, Take 1 tablet by mouth daily. , Disp: , Rfl:   Social History   Tobacco Use  Smoking Status Never Smoker  Smokeless Tobacco Never Used    Allergies  Allergen Reactions  . Carbamazepine Hives, Other (See Comments) and Rash  . Oxaprozin Hives, Other (See Comments) and Rash  . Amoxicillin-Pot Clavulanate Diarrhea and Nausea Only  . Prednisolone   . Prednisone Itching and Other (See Comments)    leg swelling /dyuria    Objective:  There were no vitals filed for this visit. There is no height or weight on file to calculate BMI. Constitutional Well developed. Well nourished.  Vascular Dorsalis pedis pulses palpable bilaterally. Posterior tibial pulses palpable bilaterally. Capillary refill normal to all digits.  No cyanosis or clubbing noted. Pedal hair growth normal.  Neurologic Normal speech. Oriented to person, place, and time. Protective sensation absent  Dermatologic Wound Location: Left submetatarsal 4 with fat layer exposed.  Probes down to bone.  No other purulent drainage was expressed.  No clinical signs of infection noted. Wound Base: Mixed Granular/Fibrotic Peri-wound: Calloused Exudate: Scant/small amount Serous exudate Wound Measurements: -See below  Orthopedic: No pain to palpation either foot.   Radiographs: 3 views of skeletally mature adult left foot: No osteomyelitis noted.  No signs of cortical irregularity or destruction of the metatarsal head noted.  No soft tissue emphysema noted.  Soft tissue air correlated with the wound. Assessment:   1. Foot ulcer, left, with fat layer exposed ([place])   2. PAD (peripheral artery disease) ([facility])   3. Vascular abnormality     Plan:  Patient was evaluated and treated and all questions answered.  Ulcer left submetatarsal 4 ulceration with fat layer exposed -Debridement as below. -Dressed with Betadine wet-to-dry dressing, DSD. -Continue off-loading with surgical shoe. -Patient will be on doxycycline indefinitely and to resolve meant of the ulceration. -ABIs PVRs were reviewed with the patient extensive detail and even though patient has optimal flow to the lower extremity based on ABIs PVRs I am worried that patient may have microvascular disease.  I will have the patient follow-up with Dr. Corrie Mckusick for further evaluation. -Continue Betadine dressing changes daily. -I discussed with the patient is a high risk  of losing for digit as well as the fourth ray versus the foot.  Patient states understanding.  Procedure: Excisional Debridement of Wound Tool: Sharp chisel blade/tissue nipper Rationale: Removal of non-viable soft tissue from the wound to promote healing.  Anesthesia: none Pre-Debridement Wound Measurements: 2 cm x 1.5 cm x 0.5 cm  Post-Debridement Wound Measurements: 2.1 cm x 1.7 cm x 0.6 cm  Type of Debridement: Sharp Excisional Tissue Removed: Non-viable soft tissue Blood loss: Minimal (<50cc) Depth of Debridement: subcutaneous tissue. Technique: Sharp excisional debridement to bleeding, viable wound base.  Wound Progress: This is my initial evaluation given that the wound has been actively treated by Dr. Cannon Kettle.  I will continue to monitor the progression of it.  It appears to be regressing from when he was seen by Dr. Cannon Kettle. Dressing: Dry, sterile, compression dressing. Disposition: Patient tolerated procedure well. Patient to return in 1 week for follow-up.  No follow-ups on file.

## 2019-10-30 ENCOUNTER — Other Ambulatory Visit: Payer: Self-pay | Admitting: Podiatry

## 2019-10-30 DIAGNOSIS — I739 Peripheral vascular disease, unspecified: Secondary | ICD-10-CM

## 2019-11-01 ENCOUNTER — Encounter: Payer: Self-pay | Admitting: *Deleted

## 2019-11-01 ENCOUNTER — Ambulatory Visit
Admission: RE | Admit: 2019-11-01 | Discharge: 2019-11-01 | Disposition: A | Discharge status: Home / Self Care | Payer: Medicare Other | Source: Ambulatory Visit | Attending: Podiatry | Admitting: Podiatry

## 2019-11-01 ENCOUNTER — Other Ambulatory Visit: Payer: Self-pay | Admitting: Student

## 2019-11-01 ENCOUNTER — Other Ambulatory Visit (HOSPITAL_COMMUNITY): Payer: Self-pay | Admitting: Interventional Radiology

## 2019-11-01 ENCOUNTER — Other Ambulatory Visit: Payer: Self-pay | Admitting: Physician Assistant

## 2019-11-01 ENCOUNTER — Telehealth: Payer: Self-pay | Admitting: Cardiovascular Disease

## 2019-11-01 DIAGNOSIS — I739 Peripheral vascular disease, unspecified: Secondary | ICD-10-CM

## 2019-11-01 DIAGNOSIS — S81802A Unspecified open wound, left lower leg, initial encounter: Secondary | ICD-10-CM

## 2019-11-01 DIAGNOSIS — S91302A Unspecified open wound, left foot, initial encounter: Secondary | ICD-10-CM | POA: Diagnosis not present

## 2019-11-01 HISTORY — PX: IR RADIOLOGIST EVAL & MGMT: IMG5224

## 2019-11-01 NOTE — Consult Note (Signed)
Chief Complaint: Patient was seen in consultation today for left foot wound at the request of Patel,Kevin P  Referring Physician(s): Patel,Kevin P  History of Present Illness: Christina [name] is a 79 y.o. female With a history of atrial fibrillation on Eliquis who presents with a progressive nonhealing wound to the plantar surface of her left foot.  The area of the wound began initially as a callus but slowly began to ulcerate, and eventually weep.  She has been seen both by Dr. Cannon Kettle, and Dr. Posey Pronto.  Despite excellent wound care and offloading with a surgical shoe, the wound continues to deteriorate.  She had a multisegmental noninvasive vascular examination that demonstrated normal flow to the ankle and great toe.  She has not a diabetic, or a smoker.  Her wound is becoming painful which is new for her.  She denies fever or chills.  She is on doxycycline.   Past Medical History:  Diagnosis Date  . Atypical chest pain    a. 08/2015 Cath: LM nl, LAD 20ost, D1 small/nl, D2 mod, nl, RI small, nl, LCX nl, OM1 small, nl, OM2 mod nl, RCA 84m EF 55-65%.  . Breast cancer ([facility]    H/O LEFT MASTECTOMY IN 2000  . Diverticulitis   . Edema   . Esophageal reflux   . Female bladder prolapse   . H/O: hysterectomy   . Hyperlipidemia   . Hypertension   . Hypokalemia   . Hypothyroidism   . Irregular heartbeat   . Melanoma ([facility] 1991   REMOVED FROM LEFT LEG IN 1991  . Neuropathy   . 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 PAF (paroxysmal atrial fibrillation) ([facility]    a. CHA2DS2VASc = 3-->Xarelto since 2015;  b. 08/2015 48h holter in setting of palpitations-->PVC's.  . Palpitations    a. 08/2015 48h Holter: pvc's.  . 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 Post-menopausal   . PVCs (premature ventricular contractions)   . Shingles   . 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 Whooping cough    as a child    Past Surgical History:  Procedure Laterality Date  .  tarsal tunnel  release  2002   Right/Left  tarsal tunnel release  . CARDIAC CATHETERIZATION  04/21/07   GLOBAL ESTIMATED EF IS 60 %; NORMAL LV FUNCTION; NORMAL CORONARY ARTERIES; BORDERLINE INCREASE IN THE AORTIC ROOT  . CARDIAC CATHETERIZATION N/A 09/18/2015   Procedure: Left Heart Cath and Coronary Angiography;  Surgeon: CBurnell Blanks MD;  [facility];  Service: Cardiovascular;  Laterality: N/A;  . COLONOSCOPY    . CYSTOSCOPY  2007   anterior repair of cystocele with mesh  . IR RADIOLOGIST EVAL & MGMT  11/01/2019  . MASTECTOMY  2000   LEFT BREAST  . MELANOMA EXCISION  1991   REMOVED FROM LEFT LEG  . NASAL SINUS SURGERY    . THYROIDECTOMY      Allergies: Carbamazepine, Oxaprozin, Amoxicillin-pot clavulanate, Prednisolone, and Prednisone  Medications: Prior to Admission medications   Medication Sig Start Date End Date Taking? Authorizing Provider  acetaminophen (TYLENOL) 500 MG tablet Take 500-1,000 mg by mouth daily as needed for mild pain or headache.    [provider]  apixaban (ELIQUIS) 5 MG TABS tablet Take 1 tablet (5 mg total) by mouth 2 (two) times daily. 08/30/19   MBurnell Blanks MD  Calcium Citrate (CITRACAL PO) Take 2 tablets by mouth 2 (two) times daily.  [provider]  cetirizine (ZYRTEC) 10 MG tablet Take by mouth.    [provider]  clindamycin (CLEOCIN) 300 MG capsule Take 1 capsule (300 mg total) by mouth 3 (three) times daily. 07/31/19   Landis Martins, DPM  diltiazem (CARDIZEM CD) 120 MG 24 hr capsule TAKE ONE CAPSULE BY MOUTH DAILY 09/22/16   Burnell Blanks, MD  doxycycline (VIBRA-TABS) 100 MG tablet Take 1 tablet (100 mg total) by mouth 2 (two) times daily for 14 days. 10/24/19 11/07/19  Felipa Furnace, DPM  ezetimibe (ZETIA) 10 MG tablet Take 10 mg by mouth daily. 04/14/19   [provider]  fish oil-omega-3 fatty acids 1000 MG capsule Take 2 g by mouth daily.     [provider]  fluticasone  (FLONASE) 50 MCG/ACT nasal spray Place 2 sprays into the nose daily.    [provider]  galantamine (RAZADYNE ER) 16 MG 24 hr capsule Take 16 mg by mouth daily. 07/27/19   [provider]  ipratropium (ATROVENT) 0.03 % nasal spray Place 2 sprays into the nose 2 (two) times daily as needed for rhinitis.     [provider]  levothyroxine (SYNTHROID, LEVOTHROID) 150 MCG tablet Take 150 mcg by mouth daily before breakfast. 150 mcg Mon - Sat 37.5 mcg on Sunday    [provider]  Misc Natural Products (GLUCOSAMINE CHOND CMP TRIPLE) TABS Take by mouth.    [provider]  Multiple Vitamins-Minerals (PRESERVISION AREDS) CAPS Take by mouth.    [provider]  nebivolol (BYSTOLIC) 5 MG tablet TAKE 1 TABLET BY MOUTH DAILY    [provider]  omeprazole (PRILOSEC) 20 MG capsule Take 20 mg by mouth daily.    [provider]  potassium chloride SA (K-DUR,KLOR-CON) 20 MEQ tablet Take 40 mEq by mouth daily.     [provider]  Probiotic Product (ALIGN PO) Take 1 tablet by mouth daily.    [provider]  simvastatin (ZOCOR) 20 MG tablet Take 20 mg by mouth at bedtime. 06/04/19   [provider]  SUTAB 602 320 1613 MG TABS  07/03/19   [provider]  triamterene-hydrochlorothiazide (MAXZIDE) 75-50 MG tablet Take 1 tablet by mouth daily.  03/12/19   [provider]     Family History  Problem Relation Age of Onset  . Pancreatitis Mother   . Heart disease Mother   . Kidney disease Mother   . Hypertension Father   . Macular degeneration Father   . Heart disease Father   . Breast cancer Neg Hx   . Colon cancer Neg Hx   . Esophageal cancer Neg Hx   . Pancreatic cancer Neg Hx   . Stomach cancer Neg Hx     Social History   Socioeconomic History  . Marital status: Married    Spouse name: Not on file  . Number of children: Not on file  . Years of education: Not on file  . Highest  education level: Not on file  Occupational History  . Occupation: RETIRED    Fish farm manager: [employer]    Comment: [employer]  Tobacco Use  . Smoking status: Never Smoker  . Smokeless tobacco: Never Used  Vaping Use  . Vaping Use: Never used  Substance and Sexual Activity  . Alcohol use: Yes    Comment: RARE  . Drug use: No  . Sexual activity: Not on file  Other Topics Concern  . Not on file  Social History Narrative  RETIRED FROM [employer]   LIVES @ HOME WITH HER HUSBAND   SHE HAS CO-AUTHORED BOOKS ON IV ADDITIVES   ETOH RARELY   NON-SMOKER         Social Determinants of Health   Financial Resource Strain:   . Difficulty of Paying Living Expenses: Not on file  Food Insecurity:   . Worried About Charity fundraiser in the Last Year: Not on file  . Ran Out of Food in the Last Year: Not on file  Transportation Needs:   . Lack of Transportation (Medical): Not on file  . Lack of Transportation (Non-Medical): Not on file  Physical Activity:   . Days of Exercise per Week: Not on file  . Minutes of Exercise per Session: Not on file  Stress:   . Feeling of Stress : Not on file  Social Connections:   . Frequency of Communication with Friends and Family: Not on file  . Frequency of Social Gatherings with Friends and Family: Not on file  . Attends Religious Services: Not on file  . Active Member of Clubs or Organizations: Not on file  . Attends Archivist Meetings: Not on file  . Marital Status: Not on file    Review of Systems: A 12 point ROS discussed and pertinent positives are indicated in the HPI above.  All other systems are negative.  Review of Systems  Vital Signs: There were no vitals taken for this visit.  Physical Exam Constitutional:      General: She is not in acute distress.    Appearance: Normal appearance. She is not toxic-appearing.  HENT:     Head: Normocephalic and atraumatic.  Eyes:     General: No scleral  icterus. Cardiovascular:     Rate and Rhythm: Normal rate.     Pulses:          Dorsalis pedis pulses are 1+ on the left side.       Posterior tibial pulses are 1+ on the left side.  Pulmonary:     Effort: Pulmonary effort is normal.  Abdominal:     General: Abdomen is flat.  Musculoskeletal:       Feet:  Feet:     Left foot:     Skin integrity: Ulcer present.     Toenail Condition: Left toenails are abnormally thick. Fungal disease present.    Comments: Dime-sized ulceration underlying the 4th metatarsal head with exposed necrotic adipose tissue.  + foul-smell, drainage Neurological:     General: No focal deficit present.     Mental Status: She is alert and oriented to person, place, and time.  Psychiatric:        Mood and Affect: Mood normal.        Behavior: Behavior normal.      Imaging: US BREAST LTD UNI RIGHT INC AXILLA  Result Date: 10/15/2019 CLINICAL DATA:  79 year old patient recalled from recent screening mammogram for evaluation of a one view only finding of a possible mass in the superior right breast. Patient has a personal history of remote left breast cancer and left mastectomy. EXAM: DIGITAL DIAGNOSTIC RIGHT MAMMOGRAM WITH CAD AND TOMO ULTRASOUND RIGHT BREAST COMPARISON:  October 05, 2019 and earlier priors ACR Breast Density Category d: The breast tissue is extremely dense, which lowers the sensitivity of mammography. FINDINGS: Spot compression view of the superior right breast with tomography shows extremely dense breast parenchyma with a probable partially obscured mass in the superior right breast. Mammographic  images were processed with CAD. Targeted ultrasound is performed, showing a circumscribed oval hypoechoic mass at 12 o'clock position 2 cm from the nipple measuring 7 x 2 x 5 mm. No internal vascular flow identified. Ultrasound of the right axilla is negative for lymphadenopathy. IMPRESSION: 7 mm circumscribed indeterminate mass 12 o'clock position right  breast. Although this may be a benign mass, malignancy is not completely excluded. RECOMMENDATION: Ultrasound-guided core needle biopsy is suggested and has been scheduled for the patient. I have discussed the findings and recommendations with the patient. If applicable, a reminder letter will be sent to the patient regarding the next appointment. BI-RADS CATEGORY  4: Suspicious. Electronically Signed   By: Curlene Dolphin M.D.   On: 10/15/2019 09:39   MM DIAG BREAST TOMO UNI RIGHT  Result Date: 10/15/2019 CLINICAL DATA:  79 year old patient recalled from recent screening mammogram for evaluation of a one view only finding of a possible mass in the superior right breast. Patient has a personal history of remote left breast cancer and left mastectomy. EXAM: DIGITAL DIAGNOSTIC RIGHT MAMMOGRAM WITH CAD AND TOMO ULTRASOUND RIGHT BREAST COMPARISON:  October 05, 2019 and earlier priors ACR Breast Density Category d: The breast tissue is extremely dense, which lowers the sensitivity of mammography. FINDINGS: Spot compression view of the superior right breast with tomography shows extremely dense breast parenchyma with a probable partially obscured mass in the superior right breast. Mammographic images were processed with CAD. Targeted ultrasound is performed, showing a circumscribed oval hypoechoic mass at 12 o'clock position 2 cm from the nipple measuring 7 x 2 x 5 mm. No internal vascular flow identified. Ultrasound of the right axilla is negative for lymphadenopathy. IMPRESSION: 7 mm circumscribed indeterminate mass 12 o'clock position right breast. Although this may be a benign mass, malignancy is not completely excluded. RECOMMENDATION: Ultrasound-guided core needle biopsy is suggested and has been scheduled for the patient. I have discussed the findings and recommendations with the patient. If applicable, a reminder letter will be sent to the patient regarding the next appointment. BI-RADS CATEGORY  4: Suspicious.  Electronically Signed   By: Curlene Dolphin M.D.   On: 10/15/2019 09:39   MM 3D SCREEN BREAST UNI RIGHT  Result Date: 10/08/2019 CLINICAL DATA:  Screening. EXAM: DIGITAL SCREENING UNILATERAL RIGHT MAMMOGRAM WITH CAD AND TOMO COMPARISON:  Previous exam(s). ACR Breast Density Category d: The breast tissue is extremely dense, which lowers the sensitivity of mammography. FINDINGS: In the right breast, a possible asymmetry warrants further evaluation. The patient has had a left mastectomy. Images were processed with CAD. IMPRESSION: Further evaluation is suggested for possible asymmetry in the right breast. RECOMMENDATION: Diagnostic mammogram and possibly ultrasound of the right breast. (Code:[id number]-60M) The patient will be contacted regarding the findings, and additional imaging will be scheduled. BI-RADS CATEGORY  0: Incomplete. Need additional imaging evaluation and/or prior mammograms for comparison. Electronically Signed   By: Audie Pinto M.D.   On: 10/08/2019 08:43   MM CLIP PLACEMENT RIGHT  Result Date: 10/22/2019 CLINICAL DATA:  Post biopsy mammogram of the right breast for clip placement. EXAM: DIAGNOSTIC RIGHT MAMMOGRAM POST ULTRASOUND BIOPSY COMPARISON:  Previous exam(s). FINDINGS: Mammographic images were obtained following ultrasound guided biopsy of a mass in the right breast at 12 o'clock. The biopsy marking clip is in expected position at the site of biopsy. IMPRESSION: Appropriate positioning of the ribbon shaped biopsy marking clip at the site of biopsy in the superior right breast. Final Assessment: Post Procedure Mammograms for Marker Placement  Electronically Signed   By: Ammie Ferrier M.D.   On: 10/22/2019 08:22   [place] RT BREAST BX W LOC DEV 1ST LESION IMG BX SPEC US GUIDE  Addendum Date: 10/23/2019   ADDENDUM REPORT: 10/23/2019 14:18 ADDENDUM: Pathology revealed FIBROADENOMA of the RIGHT breast, 12 o'clock. This was found to be concordant by Dr. Ammie Ferrier. Pathology results  were discussed with the patient by telephone. The patient reported doing well after the biopsy with tenderness at the site. Post biopsy instructions and care were reviewed and questions were answered. The patient was encouraged to call [facility] for any additional concerns. The patient was instructed to return for annual screening mammography and informed a reminder notice would be sent regarding this appointment. Pathology results reported by Stacie Acres RN on 10/23/2019. Electronically Signed   By: Ammie Ferrier M.D.   On: 10/23/2019 14:18   Result Date: 10/23/2019 CLINICAL DATA:  79 year old female presenting for ultrasound-guided biopsy of a right breast mass. EXAM: ULTRASOUND GUIDED RIGHT BREAST CORE NEEDLE BIOPSY COMPARISON:  Previous exam(s). PROCEDURE: I met with the patient and we discussed the procedure of ultrasound-guided biopsy, including benefits and alternatives. We discussed the high likelihood of a successful procedure. We discussed the risks of the procedure, including infection, bleeding, tissue injury, clip migration, and inadequate sampling. Informed written consent was given. The usual time-out protocol was performed immediately prior to the procedure. Lesion quadrant: Upper inner quadrant Using sterile technique and 1% Lidocaine as local anesthetic, under direct ultrasound visualization, a 14 gauge spring-loaded device was used to perform biopsy of a mass in the right breast at 12 o'clock using a lateral approach. At the conclusion of the procedure ribbon shaped tissue marker clip was deployed into the biopsy cavity. Follow up 2 view mammogram was performed and dictated separately. IMPRESSION: Ultrasound guided biopsy of a right breast mass at 12 o'clock. No apparent complications. Electronically Signed: By: Ammie Ferrier M.D. On: 10/22/2019 08:09   VAS [place] LE ART SEG MULTI (Segm&LE Reynauds)  Result Date: 10/16/2019 LOWER EXTREMITY DOPPLER STUDY  Indications: Non-healing ulcer on the plantar surface of left foot. This is              located at the base between the 4th and 5th metatarsal. The wound              started out as a callus back in the Fall of 2020 and started to              ooze back in 01/2019. She denies claudication and rest pain              symtoms. She also has chronic lower leg swelling and wears support              stockings to manage the swelling. Today she did not wear them and              subsequently both lower calves were swollen. High Risk Factors: Hypertension, no history of smoking.  Comparison Study: None Performing Technologist: Salvadore Dom RVT  Examination Guidelines: A complete evaluation includes at minimum, Doppler waveform signals and systolic blood pressure reading at the level of bilateral brachial, anterior tibial, and posterior tibial arteries, when vessel segments are accessible. Bilateral testing is considered an integral part of a complete examination. Photoelectric Plethysmograph (PPG) waveforms and toe systolic pressure readings are included as required and additional duplex testing as needed. Limited examinations for reoccurring indications may  be performed as noted.  ABI Findings: +---------+------------------+-----+-----------------------------+--------+ Right    Rt Pressure (mmHg)IndexWaveform                     Comment  +---------+------------------+-----+-----------------------------+--------+ Brachial 130                                                          +---------+------------------+-----+-----------------------------+--------+ CFA                             triphasic                             +---------+------------------+-----+-----------------------------+--------+ Popliteal                       triphasic                             +---------+------------------+-----+-----------------------------+--------+ ATA      128               0.98 triphasic                              +---------+------------------+-----+-----------------------------+--------+ PTA      133               1.02 triphasic                             +---------+------------------+-----+-----------------------------+--------+ PERO     122               0.94 barely triphasic, swollen leg         +---------+------------------+-----+-----------------------------+--------+ Great Toe110               0.85 Normal                                +---------+------------------+-----+-----------------------------+--------+ +---------+------------------+-----+------------------------------+-------+ Left     Lt Pressure (mmHg)IndexWaveform                      Comment +---------+------------------+-----+------------------------------+-------+ Brachial 127                                                          +---------+------------------+-----+------------------------------+-------+ CFA                             triphasic                             +---------+------------------+-----+------------------------------+-------+ Popliteal                       triphasic                             +---------+------------------+-----+------------------------------+-------+  ATA      138               1.06 triphasic                             +---------+------------------+-----+------------------------------+-------+ PTA      139               1.07 triphasic                             +---------+------------------+-----+------------------------------+-------+ PERO     137               1.05 barely triphasic, swollen leg.        +---------+------------------+-----+------------------------------+-------+ Great Toe110               0.85 Normal                                +---------+------------------+-----+------------------------------+-------+ +-------+-----------+-----------+------------+------------+ ABI/TBIToday's ABIToday's TBIPrevious  ABIPrevious TBI +-------+-----------+-----------+------------+------------+ Right  1.02       .85                                 +-------+-----------+-----------+------------+------------+ Left   1.07       .85                                 +-------+-----------+-----------+------------+------------+  TOES Findings: +----------+---------------+--------+-------+ Right ToesPressure (mmHg)WaveformComment +----------+---------------+--------+-------+ 1st Digit                Normal          +----------+---------------+--------+-------+ 2nd Digit                Normal          +----------+---------------+--------+-------+ 3rd Digit                Normal          +----------+---------------+--------+-------+ 4th Digit                Abnormal        +----------+---------------+--------+-------+ 5th Digit                Abnormal        +----------+---------------+--------+-------+  +---------+---------------+--------+-------+ Left ToesPressure (mmHg)WaveformComment +---------+---------------+--------+-------+ 1st Digit               Normal          +---------+---------------+--------+-------+ 2nd Digit               Abnormal        +---------+---------------+--------+-------+ 3rd Digit               Abnormal        +---------+---------------+--------+-------+ 4th Digit               Normal          +---------+---------------+--------+-------+ 5th Digit               Abnormal        +---------+---------------+--------+-------+    Summary: Right: Resting right ankle-brachial index is within normal range. No evidence of significant right lower extremity arterial disease. The right toe-brachial index is normal. Left: Resting left ankle-brachial index is within normal range. No evidence  of significant left lower extremity arterial disease. The left toe-brachial index is normal.  *See table(s) above for measurements and observations.  Electronically  signed by Ena Dawley MD on 10/16/2019 at 8:25:16 AM.    Final    IR Radiologist Eval & Mgmt  Result Date: 11/01/2019 Please refer to notes tab for details about interventional procedure. (Op Note)   Labs:  CBC: Recent Labs    10/04/19 1358  WBC 7.8  HGB 15.1*  HCT 48.3*  PLT 228    COAGS: Recent Labs    10/04/19 1358  INR 1.1    BMP: No results for input(s): NA, K, CL, CO2, GLUCOSE, BUN, CALCIUM, CREATININE, GFRNONAA, GFRAA in the last 8760 hours.  Invalid input(s): CMP  LIVER FUNCTION TESTS: No results for input(s): BILITOT, AST, ALT, ALKPHOS, PROT, ALBUMIN in the last 8760 hours.  TUMOR MARKERS: No results for input(s): AFPTM, CEA, CA199, CHROMGRNA in the last 8760 hours.  Assessment and Plan:  However, there is a possibility of small vessel disease, perhaps with occlusion of the lateral plantar artery and/or incomplete plantar arch.  Given the severity of her wound, I believe it would be warranted to proceed with a diagnostic angiogram to fully evaluate the vasculature.  If there is a target to improve blood flow in the lateral plantar arterial distribution, we should attempt to revascularize.  She understands that the angiogram may be diagnostic and demonstrate that she has adequate flow to the area.  This would still be valuable information.  She understands the options and desires to proceed.  1.)  Schedule for left lower extremity arteriogram and possible intervention as soon as possible.  She will need to hold her Eliquis for 48 hours.  Thank you for this interesting consult.  I greatly enjoyed meeting Christina [name] [place] and look forward to participating in their care.  A copy of this report was sent to the requesting provider on this date.  Electronically Signed: Jacqulynn Cadet 11/01/2019, 10:39 AM   I spent a total of 30 Minutes  in face to face in clinical consultation, greater than 50% of which was counseling/coordinating care for left foot  wound.

## 2019-11-01 NOTE — Telephone Encounter (Signed)
   [facility] Pre-operative Risk Assessment    HEARTCARE STAFF: - Please ensure there is not already an duplicate clearance open for this procedure. - Under Visit Info/Reason for Call, type in Other and utilize the format Clearance MM/DD/YY or Clearance TBD. Do not use dashes or single digits. - If request is for dental extraction, please clarify the # of teeth to be extracted.  Request for surgical clearance:  1. What type of surgery is being performed? Angiogram and possible embolization  2. When is this surgery scheduled? TDB - either 11/03/19 or 11/05/19   3. What type of clearance is required (medical clearance vs. Pharmacy clearance to hold med vs. Both)? both  4. Are there any medications that need to be held prior to surgery and how long? Eliquis - 48 hours prior  5. Practice name and name of physician performing surgery? Jacqulynn Cadet at [facility]  6. What is the office phone number? [phone]   7.   What is the office fax number? [phone], you can also put the note in Epic if possible.   8.   Anesthesia type (None, local, MAC, general) ? Moderate sedation   Christina [name] 11/01/2019, 10:46 AM  _________________________________________________________________   (provider comments below)

## 2019-11-01 NOTE — Telephone Encounter (Signed)
   Primary Cardiologist: Lauree Chandler, MD  Chart reviewed and patient contacted by phone today as part of pre-operative protocol coverage. Given past medical history and time since last visit, based on ACC/AHA guidelines, Christina [name] would be at acceptable risk for the planned procedure without further cardiovascular testing.   Based on prior recommendations OK to hold Eliquis 48 hours pre op and resume as soon as safe post op.   The patient was advised that if she develops new symptoms prior to surgery to contact our office to arrange for a follow-up visit, and she verbalized understanding.  I will route this recommendation to the requesting party via Epic fax function and remove from pre-op pool.  Please call with questions.  Kerin Ransom, PA-C 11/01/2019, 2:42 PM

## 2019-11-02 ENCOUNTER — Inpatient Hospital Stay (HOSPITAL_COMMUNITY): Admission: RE | Admit: 2019-11-02 | Payer: Medicare Other | Source: Ambulatory Visit

## 2019-11-02 ENCOUNTER — Other Ambulatory Visit: Payer: Self-pay | Admitting: Student

## 2019-11-05 ENCOUNTER — Ambulatory Visit: Payer: Medicare Other | Admitting: Podiatry

## 2019-11-05 ENCOUNTER — Other Ambulatory Visit: Payer: Self-pay

## 2019-11-05 ENCOUNTER — Ambulatory Visit (HOSPITAL_COMMUNITY)
Admission: RE | Admit: 2019-11-05 | Discharge: 2019-11-05 | Disposition: A | Discharge status: Home / Self Care | Payer: Medicare Other | Source: Ambulatory Visit | Attending: Interventional Radiology | Admitting: Interventional Radiology

## 2019-11-05 ENCOUNTER — Encounter (HOSPITAL_COMMUNITY): Payer: Self-pay

## 2019-11-05 ENCOUNTER — Other Ambulatory Visit (HOSPITAL_COMMUNITY): Payer: Self-pay | Admitting: Interventional Radiology

## 2019-11-05 DIAGNOSIS — Z853 Personal history of malignant neoplasm of breast: Secondary | ICD-10-CM | POA: Diagnosis not present

## 2019-11-05 DIAGNOSIS — L97429 Non-pressure chronic ulcer of left heel and midfoot with unspecified severity: Secondary | ICD-10-CM | POA: Diagnosis not present

## 2019-11-05 DIAGNOSIS — Z79890 Hormone replacement therapy (postmenopausal): Secondary | ICD-10-CM | POA: Diagnosis not present

## 2019-11-05 DIAGNOSIS — I10 Essential (primary) hypertension: Secondary | ICD-10-CM | POA: Diagnosis not present

## 2019-11-05 DIAGNOSIS — Z881 Allergy status to other antibiotic agents status: Secondary | ICD-10-CM | POA: Diagnosis not present

## 2019-11-05 DIAGNOSIS — E039 Hypothyroidism, unspecified: Secondary | ICD-10-CM | POA: Insufficient documentation

## 2019-11-05 DIAGNOSIS — K219 Gastro-esophageal reflux disease without esophagitis: Secondary | ICD-10-CM | POA: Diagnosis not present

## 2019-11-05 DIAGNOSIS — E785 Hyperlipidemia, unspecified: Secondary | ICD-10-CM | POA: Insufficient documentation

## 2019-11-05 DIAGNOSIS — Z7901 Long term (current) use of anticoagulants: Secondary | ICD-10-CM | POA: Insufficient documentation

## 2019-11-05 DIAGNOSIS — S91302A Unspecified open wound, left foot, initial encounter: Secondary | ICD-10-CM | POA: Diagnosis not present

## 2019-11-05 DIAGNOSIS — S81802A Unspecified open wound, left lower leg, initial encounter: Secondary | ICD-10-CM

## 2019-11-05 DIAGNOSIS — I480 Paroxysmal atrial fibrillation: Secondary | ICD-10-CM | POA: Insufficient documentation

## 2019-11-05 DIAGNOSIS — Z888 Allergy status to other drugs, medicaments and biological substances status: Secondary | ICD-10-CM | POA: Diagnosis not present

## 2019-11-05 DIAGNOSIS — Z79899 Other long term (current) drug therapy: Secondary | ICD-10-CM | POA: Diagnosis not present

## 2019-11-05 DIAGNOSIS — Z85850 Personal history of malignant neoplasm of thyroid: Secondary | ICD-10-CM | POA: Insufficient documentation

## 2019-11-05 DIAGNOSIS — G629 Polyneuropathy, unspecified: Secondary | ICD-10-CM | POA: Insufficient documentation

## 2019-11-05 HISTORY — PX: IR ANGIOGRAM EXTREMITY LEFT: IMG651

## 2019-11-05 HISTORY — PX: IR US GUIDE VASC ACCESS LEFT: IMG2389

## 2019-11-05 LAB — POCT I-STAT, CHEM 8
BUN: 28 mg/dL — ABNORMAL HIGH (ref 8–23)
Calcium, Ion: 1.22 mmol/L (ref 1.15–1.40)
Chloride: 102 mmol/L (ref 98–111)
Creatinine, Ser: 1.1 mg/dL — ABNORMAL HIGH (ref 0.44–1.00)
Glucose, Bld: 94 mg/dL (ref 70–99)
HCT: 39 % (ref 36.0–46.0)
Hemoglobin: 13.3 g/dL (ref 12.0–15.0)
Potassium: 3.6 mmol/L (ref 3.5–5.1)
Sodium: 143 mmol/L (ref 135–145)
TCO2: 28 mmol/L (ref 22–32)

## 2019-11-05 LAB — CBC
HCT: 45 % (ref 36.0–46.0)
Hemoglobin: 14.4 g/dL (ref 12.0–15.0)
MCH: 28.6 pg (ref 26.0–34.0)
MCHC: 32 g/dL (ref 30.0–36.0)
MCV: 89.5 fL (ref 80.0–100.0)
Platelets: 289 10*3/uL (ref 150–400)
RBC: 5.03 MIL/uL (ref 3.87–5.11)
RDW: 14.6 % (ref 11.5–15.5)
WBC: 8.8 10*3/uL (ref 4.0–10.5)
nRBC: 0 % (ref 0.0–0.2)

## 2019-11-05 MED ORDER — MIDAZOLAM HCL 2 MG/2ML IJ SOLN
INTRAMUSCULAR | Status: AC | PRN
  Administered 2019-11-05: 1 mg via INTRAVENOUS

## 2019-11-05 MED ORDER — MIDAZOLAM HCL 2 MG/2ML IJ SOLN
INTRAMUSCULAR | Status: AC
  Filled 2019-11-05: qty 2

## 2019-11-05 MED ORDER — CLOPIDOGREL BISULFATE 300 MG PO TABS
ORAL_TABLET | ORAL | Status: AC
  Administered 2019-11-05: 300 mg via ORAL
  Filled 2019-11-05: qty 1

## 2019-11-05 MED ORDER — CLOPIDOGREL BISULFATE 75 MG PO TABS: 300.0000 mg | ORAL_TABLET | Freq: Once | ORAL | Status: AC

## 2019-11-05 MED ORDER — IODIXANOL 320 MG/ML IV SOLN
150.0000 mL | Freq: Once | INTRAVENOUS | Status: AC | PRN
  Administered 2019-11-05: 30 mL via INTRA_ARTERIAL

## 2019-11-05 MED ORDER — FENTANYL CITRATE (PF) 100 MCG/2ML IJ SOLN
INTRAMUSCULAR | Status: AC | PRN
  Administered 2019-11-05: 50 ug via INTRAVENOUS

## 2019-11-05 MED ORDER — LIDOCAINE HCL 1 % IJ SOLN
INTRAMUSCULAR | Status: AC
  Filled 2019-11-05: qty 20

## 2019-11-05 MED ORDER — FENTANYL CITRATE (PF) 100 MCG/2ML IJ SOLN
INTRAMUSCULAR | Status: AC
  Filled 2019-11-05: qty 2

## 2019-11-05 MED ORDER — SODIUM CHLORIDE 0.9 % IV SOLN: INTRAVENOUS | Status: DC

## 2019-11-05 MED ORDER — HEPARIN SODIUM (PORCINE) 1000 UNIT/ML IJ SOLN
INTRAMUSCULAR | Status: AC
  Filled 2019-11-05: qty 1

## 2019-11-05 MED ORDER — PROTAMINE SULFATE 10 MG/ML IV SOLN
50.0000 mg | Freq: Once | INTRAVENOUS | Status: DC
  Filled 2019-11-05: qty 5

## 2019-11-05 MED ORDER — LIDOCAINE HCL 1 % IJ SOLN
INTRAMUSCULAR | Status: AC | PRN
  Administered 2019-11-05: 10 mL via INTRADERMAL

## 2019-11-05 MED ORDER — IOHEXOL 300 MG/ML  SOLN: 100.0000 mL | Freq: Once | INTRAMUSCULAR | Status: DC | PRN

## 2019-11-05 NOTE — Discharge Instructions (Signed)
Restart eliquis tomorrow  Angiogram, Care After This sheet gives you information about how to care for yourself after your procedure. Your health care provider may also give you more specific instructions. If you have problems or questions, contact your health care provider. What can I expect after the procedure? After the procedure, it is common to have bruising and tenderness at the catheter insertion area. Follow these instructions at home: Insertion site care  Follow instructions from your health care provider about how to take care of your insertion site. Make sure you: ? Wash your hands with soap and water before you change your bandage (dressing). If soap and water are not available, use hand sanitizer. ? Change your dressing as told by your health care provider. ? Leave stitches (sutures), skin glue, or adhesive strips in place. These skin closures may need to stay in place for 2 weeks or longer. If adhesive strip edges start to loosen and curl up, you may trim the loose edges. Do not remove adhesive strips completely unless your health care provider tells you to do that.  Do not take baths, swim, or use a hot tub until your health care provider approves.  You may shower 24-48 hours after the procedure or as told by your health care provider. ? Gently wash the site with plain soap and water. ? Pat the area dry with a clean towel. ? Do not rub the site. This may cause bleeding.  Do not apply powder or lotion to the site. Keep the site clean and dry.  Check your insertion site every day for signs of infection. Check for: ? Redness, swelling, or pain. ? Fluid or blood. ? Warmth. ? Pus or a bad smell. Activity  Rest as told by your health care provider, usually for 1-2 days.  Do not lift anything that is heavier than 10 lbs. (4.5 kg) or as told by your health care provider.  Do not drive for 24 hours if you were given a medicine to help you relax (sedative).  Do not drive or  use heavy machinery while taking prescription pain medicine. General instructions   Return to your normal activities as told by your health care provider, usually in about a week. Ask your health care provider what activities are safe for you.  If the catheter site starts bleeding, lie flat and put pressure on the site. If the bleeding does not stop, get help right away. This is a medical emergency.  Drink enough fluid to keep your urine clear or pale yellow. This helps flush the contrast dye from your body.  Take over-the-counter and prescription medicines only as told by your health care provider.  Keep all follow-up visits as told by your health care provider. This is important. Contact a health care provider if:  You have a fever or chills.  You have redness, swelling, or pain around your insertion site.  You have fluid or blood coming from your insertion site.  The insertion site feels warm to the touch.  You have pus or a bad smell coming from your insertion site.  You have bruising around the insertion site.  You notice blood collecting in the tissue around the catheter site (hematoma). The hematoma may be painful to the touch. Get help right away if:  You have severe pain at the catheter insertion area.  The catheter insertion area swells very fast.  The catheter insertion area is bleeding, and the bleeding does not stop when you hold steady  pressure on the area.  The area near or just beyond the catheter insertion site becomes pale, cool, tingly, or numb. These symptoms may represent a serious problem that is an emergency. Do not wait to see if the symptoms will go away. Get medical help right away. Call your local emergency services (911 in the [place] Do not drive yourself to the hospital. Summary  After the procedure, it is common to have bruising and tenderness at the catheter insertion area.  After the procedure, it is important to rest and drink plenty of  fluids.  Do not take baths, swim, or use a hot tub until your health care provider says it is okay to do so. You may shower 24-48 hours after the procedure or as told by your health care provider.  If the catheter site starts bleeding, lie flat and put pressure on the site. If the bleeding does not stop, get help right away. This is a medical emergency. This information is not intended to replace advice given to you by your health care provider. Make sure you discuss any questions you have with your health care provider. Document Revised: 01/21/2017 Document Reviewed: 01/14/2016 [employer]vier Patient Education  2020 [employer].

## 2019-11-05 NOTE — H&P (Signed)
Chief Complaint: Patient was seen in consultation today for left lower extremity arteriogram with possible intervention at the request of Dr Boneta Lucks   Supervising Physician: Corrie Mckusick  Patient Status: [name] - Out[facility]  History of Present Illness: [name] is a 79 y.o. female   Pt was seen in consultation with Dr [name] 11/01/19 Progressive non  Healing left plantar surface foot wound  Wound is persistent despite treatments She had a multisegmental noninvasive vascular examination that demonstrated normal flow to the ankle and great toe. She has not a diabetic, or a smoker.  Her wound is becoming painful which is new for her.  She denies fever or chills.  She is on doxycycline.  8/23 evaluation---Summary: Right: Resting right ankle-brachial index is within normal range. No evidence of significant right lower extremity arterial disease. The right toe-brachial index is normal. Left: Resting left ankle-brachial index is within normal range. No evidence of significant left lower extremity arterial disease. The left toe-brachial index is normal.  Exam 11/01/19: Toenail Condition: Left toenails are abnormally thick. Fungal disease present.    Comments: Dime-sized ulceration underlying the 4th metatarsal head with exposed necrotic adipose tissue.  + foul-smell, drainage  Scheduled now for left lower extremity arteriogram with possible intervention    Past Medical History:  Diagnosis Date  . Atypical chest pain    a. 08/2015 Cath: LM nl, LAD 20ost, D1 small/nl, D2 mod, nl, RI small, nl, LCX nl, OM1 small, nl, OM2 mod nl, RCA 13m EF 55-65%.  . Breast cancer ([name]    H/O LEFT MASTECTOMY IN 2000  . Diverticulitis   . Edema   . Esophageal reflux   . Female bladder prolapse   . H/O: hysterectomy   . Hyperlipidemia   . Hypertension   . Hypokalemia   . Hypothyroidism   . Irregular heartbeat   . Melanoma ([name] 1991   REMOVED FROM LEFT LEG IN 1991  . Neuropathy    . 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 PAF (paroxysmal atrial fibrillation) (Christina [name]    a. CHA2DS2VASc = 3-->Xarelto since 2015;  b. 08/2015 48h holter in setting of palpitations-->PVC's.  . Palpitations    a. 08/2015 48h Holter: pvc's.  . 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 Post-menopausal   . PVCs (premature ventricular contractions)   . Shingles   . Thyroid cancer (Christina [name]    s/p THYROIDECTOMY  . Whooping cough    as a child    Past Surgical History:  Procedure Laterality Date  .  tarsal tunnel release  2002   Right/Left  tarsal tunnel release  . CARDIAC CATHETERIZATION  04/21/07   GLOBAL ESTIMATED EF IS 60 %; NORMAL LV FUNCTION; NORMAL CORONARY ARTERIES; BORDERLINE INCREASE IN THE AORTIC ROOT  . CARDIAC CATHETERIZATION N/A 09/18/2015   Procedure: Left Heart Cath and Coronary Angiography;  Surgeon: CBurnell Blanks MD;  Location: [facility];  Service: Cardiovascular;  Laterality: N/A;  . COLONOSCOPY    . CYSTOSCOPY  2007   anterior repair of cystocele with mesh  . IR RADIOLOGIST EVAL & MGMT  11/01/2019  . MASTECTOMY  2000   LEFT BREAST  . MELANOMA EXCISION  1991   REMOVED FROM LEFT LEG  . NASAL SINUS SURGERY    . THYROIDECTOMY      Allergies: Carbamazepine, Oxaprozin, Amoxicillin-pot clavulanate, Prednisolone, and Prednisone  Medications: Prior to Admission medications   Medication Sig Start Date End Date Taking? Authorizing Provider  acetaminophen (TYLENOL) 500 MG  tablet Take 500-1,000 mg by mouth daily as needed for mild pain or headache.   Yes [provider]  apixaban (ELIQUIS) 5 MG TABS tablet Take 1 tablet (5 mg total) by mouth 2 (two) times daily. 08/30/19  Yes Burnell Blanks, MD  Calcium-Phosphorus-Vitamin D (CITRACAL +D3 PO) Take 2 tablets by mouth 2 (two) times daily.   Yes [provider]  diltiazem (CARDIZEM CD) 120 MG 24 hr capsule TAKE ONE CAPSULE BY MOUTH DAILY Patient taking differently: Take 120 mg by mouth daily.   09/22/16  Yes Burnell Blanks, MD  doxycycline (VIBRA-TABS) 100 MG tablet Take 1 tablet (100 mg total) by mouth 2 (two) times daily for 14 days. 10/24/19 11/07/19 Yes Felipa Furnace, DPM  ezetimibe (ZETIA) 10 MG tablet Take 10 mg by mouth daily. 04/14/19  Yes [provider]  galantamine (RAZADYNE ER) 24 MG 24 hr capsule Take 24 mg by mouth daily with breakfast.   Yes [provider]  Glucosamine-Chondroitin (OSTEO BI-FLEX REGULAR STRENGTH PO) Take 2 tablets by mouth daily after supper.   Yes [provider]  levothyroxine (SYNTHROID, LEVOTHROID) 150 MCG tablet Take 150 mcg by mouth See admin instructions. Take 150 mcg before breakfast Monday through Saturday. Skip dose on Sundays   Yes [provider]  Multiple Vitamins-Minerals (PRESERVISION AREDS) CAPS Take 1 capsule by mouth 2 (two) times daily.    Yes [provider]  nebivolol (BYSTOLIC) 5 MG tablet Take 5 mg by mouth at bedtime.    Yes [provider]  Omega-3 Fatty Acids (FISH OIL) 1200 MG CAPS Take 2,400 mg by mouth daily.   Yes [provider]  omeprazole (PRILOSEC) 20 MG capsule Take 20 mg by mouth daily.   Yes [provider]  potassium chloride SA (K-DUR,KLOR-CON) 20 MEQ tablet Take 40 mEq by mouth daily.    Yes [provider]  simvastatin (ZOCOR) 20 MG tablet Take 20 mg by mouth at bedtime. 06/04/19  Yes [provider]  triamterene-hydrochlorothiazide (MAXZIDE-25) 37.5-25 MG tablet Take 1 tablet by mouth daily.   Yes [provider]  clindamycin (CLEOCIN) 300 MG capsule Take 1 capsule (300 mg total) by mouth 3 (three) times daily. Patient not taking: Reported on 11/02/2019 07/31/19   Landis Martins, DPM     Family History  Problem Relation Age of Onset  . Pancreatitis Mother   . Heart disease Mother   . Kidney disease Mother   . Hypertension Father   . Macular degeneration Father   . Heart disease Father   . Breast cancer Neg Hx    . Colon cancer Neg Hx   . Esophageal cancer Neg Hx   . Pancreatic cancer Neg Hx   . Stomach cancer Neg Hx     Social History   Socioeconomic History  . Marital status: Married    Spouse name: Not on file  . Number of children: Not on file  . Years of education: Not on file  . Highest education level: Not on file  Occupational History  . Occupation: RETIRED    Fish farm manager: [employer]    Comment: [employer]  Tobacco Use  . Smoking status: Never Smoker  . Smokeless tobacco: Never Used  Vaping Use  . Vaping Use: Never used  Substance and Sexual Activity  . Alcohol use: Yes    Comment: RARE  . Drug use: No  . Sexual activity: Not on file  Other Topics Concern  . Not on file  Social History Narrative   RETIRED FROM [employer]   LIVES @ HOME WITH HER HUSBAND   SHE HAS CO-AUTHORED BOOKS ON IV ADDITIVES   ETOH RARELY   NON-SMOKER         Social Determinants of Health   Financial Resource Strain:   . Difficulty of Paying Living Expenses: Not on file  Food Insecurity:   . Worried About Charity fundraiser in the Last Year: Not on file  . Ran Out of Food in the Last Year: Not on file  Transportation Needs:   . Lack of Transportation (Medical): Not on file  . Lack of Transportation (Non-Medical): Not on file  Physical Activity:   . Days of Exercise per Week: Not on file  . Minutes of Exercise per Session: Not on file  Stress:   . Feeling of Stress : Not on file  Social Connections:   . Frequency of Communication with Friends and Family: Not on file  . Frequency of Social Gatherings with Friends and Family: Not on file  . Attends Religious Services: Not on file  . Active Member of Clubs or Organizations: Not on file  . Attends Archivist Meetings: Not on file  . Marital Status: Not on file    Review of Systems: A 12 point ROS discussed and pertinent positives are indicated in the HPI above.  All other systems are negative.  Review of  Systems  Constitutional: Positive for activity change.  Respiratory: Negative for cough and shortness of breath.   Gastrointestinal: Negative for abdominal pain.  Musculoskeletal: Positive for gait problem.  Psychiatric/Behavioral: Negative for behavioral problems and confusion.    Vital Signs: BP 137/88   Pulse (!) 57   Temp 97.7 F (36.5 C) (Oral)   Resp 14   Ht _0  (1.778 m)   Wt 140 lb (63.5 kg)   SpO2 100%   BMI 20.09 kg/m   Physical Exam Vitals reviewed.  Cardiovascular:     Rate and Rhythm: Normal rate and regular rhythm.     Heart sounds: Normal heart sounds.  Pulmonary:     Effort: Pulmonary effort is normal.     Breath sounds: Normal breath sounds.  Abdominal:     Palpations: Abdomen is soft.  Musculoskeletal:        General: Swelling and tenderness present. Normal range of motion.     Left lower leg: Edema present.     Comments: Left foot swelling Redness of foot and ankle Plantar surface lesion  Skin:    Findings: Erythema present.  Neurological:     Mental Status: She is alert and oriented to person, place, and time.  Psychiatric:        Behavior: Behavior normal.     Imaging: US BREAST LTD UNI RIGHT INC AXILLA  Result Date: 10/15/2019 CLINICAL DATA:  79 year old patient recalled from recent screening mammogram for evaluation of a one view only finding of a possible mass in the superior right breast. Patient has a personal history of remote left breast cancer and left mastectomy. EXAM: DIGITAL DIAGNOSTIC RIGHT MAMMOGRAM WITH CAD AND TOMO ULTRASOUND RIGHT BREAST COMPARISON:  October 05, 2019 and earlier priors ACR Breast Density Category d: The breast tissue is extremely dense, which lowers the sensitivity of mammography. FINDINGS: Spot compression view of the superior right breast with tomography shows extremely dense breast parenchyma with a probable partially obscured mass in the superior right breast. Mammographic images were processed with CAD.  Targeted ultrasound  is performed, showing a circumscribed oval hypoechoic mass at 12 o'clock position 2 cm from the nipple measuring 7 x 2 x 5 mm. No internal vascular flow identified. Ultrasound of the right axilla is negative for lymphadenopathy. IMPRESSION: 7 mm circumscribed indeterminate mass 12 o'clock position right breast. Although this may be a benign mass, malignancy is not completely excluded. RECOMMENDATION: Ultrasound-guided core needle biopsy is suggested and has been scheduled for the patient. I have discussed the findings and recommendations with the patient. If applicable, a reminder letter will be sent to the patient regarding the next appointment. BI-RADS CATEGORY  4: Suspicious. Electronically Signed   By: Curlene Dolphin M.D.   On: 10/15/2019 09:39   MM DIAG BREAST TOMO UNI RIGHT  Result Date: 10/15/2019 CLINICAL DATA:  79 year old patient recalled from recent screening mammogram for evaluation of a one view only finding of a possible mass in the superior right breast. Patient has a personal history of remote left breast cancer and left mastectomy. EXAM: DIGITAL DIAGNOSTIC RIGHT MAMMOGRAM WITH CAD AND TOMO ULTRASOUND RIGHT BREAST COMPARISON:  October 05, 2019 and earlier priors ACR Breast Density Category d: The breast tissue is extremely dense, which lowers the sensitivity of mammography. FINDINGS: Spot compression view of the superior right breast with tomography shows extremely dense breast parenchyma with a probable partially obscured mass in the superior right breast. Mammographic images were processed with CAD. Targeted ultrasound is performed, showing a circumscribed oval hypoechoic mass at 12 o'clock position 2 cm from the nipple measuring 7 x 2 x 5 mm. No internal vascular flow identified. Ultrasound of the right axilla is negative for lymphadenopathy. IMPRESSION: 7 mm circumscribed indeterminate mass 12 o'clock position right breast. Although this may be a benign mass, malignancy is  not completely excluded. RECOMMENDATION: Ultrasound-guided core needle biopsy is suggested and has been scheduled for the patient. I have discussed the findings and recommendations with the patient. If applicable, a reminder letter will be sent to the patient regarding the next appointment. BI-RADS CATEGORY  4: Suspicious. Electronically Signed   By: Curlene Dolphin M.D.   On: 10/15/2019 09:39   MM CLIP PLACEMENT RIGHT  Result Date: 10/22/2019 CLINICAL DATA:  Post biopsy mammogram of the right breast for clip placement. EXAM: DIAGNOSTIC RIGHT MAMMOGRAM POST ULTRASOUND BIOPSY COMPARISON:  Previous exam(s). FINDINGS: Mammographic images were obtained following ultrasound guided biopsy of a mass in the right breast at 12 o'clock. The biopsy marking clip is in expected position at the site of biopsy. IMPRESSION: Appropriate positioning of the ribbon shaped biopsy marking clip at the site of biopsy in the superior right breast. Final Assessment: Post Procedure Mammograms for Marker Placement Electronically Signed   By: Ammie Ferrier M.D.   On: 10/22/2019 08:22   [place] RT BREAST BX W LOC DEV 1ST LESION IMG BX SPEC US GUIDE  Addendum Date: 10/23/2019   ADDENDUM REPORT: 10/23/2019 14:18 ADDENDUM: Pathology revealed FIBROADENOMA of the RIGHT breast, 12 o'clock. This was found to be concordant by Dr. Ammie Ferrier. Pathology results were discussed with the patient by telephone. The patient reported doing well after the biopsy with tenderness at the site. Post biopsy instructions and care were reviewed and questions were answered. The patient was encouraged to call [facility] for any additional concerns. The patient was instructed to return for annual screening mammography and informed a reminder notice would be sent regarding this appointment. Pathology results reported by Stacie Acres RN on 10/23/2019. Electronically Signed  By: Ammie Ferrier M.D.   On: 10/23/2019 14:18   Result  Date: 10/23/2019 CLINICAL DATA:  79 year old female presenting for ultrasound-guided biopsy of a right breast mass. EXAM: ULTRASOUND GUIDED RIGHT BREAST CORE NEEDLE BIOPSY COMPARISON:  Previous exam(s). PROCEDURE: I met with the patient and we discussed the procedure of ultrasound-guided biopsy, including benefits and alternatives. We discussed the high likelihood of a successful procedure. We discussed the risks of the procedure, including infection, bleeding, tissue injury, clip migration, and inadequate sampling. Informed written consent was given. The usual time-out protocol was performed immediately prior to the procedure. Lesion quadrant: Upper inner quadrant Using sterile technique and 1% Lidocaine as local anesthetic, under direct ultrasound visualization, a 14 gauge spring-loaded device was used to perform biopsy of a mass in the right breast at 12 o'clock using a lateral approach. At the conclusion of the procedure ribbon shaped tissue marker clip was deployed into the biopsy cavity. Follow up 2 view mammogram was performed and dictated separately. IMPRESSION: Ultrasound guided biopsy of a right breast mass at 12 o'clock. No apparent complications. Electronically Signed: By: Ammie Ferrier M.D. On: 10/22/2019 08:09   VAS [place] LE ART SEG MULTI (Segm&LE Reynauds)  Result Date: 10/16/2019 LOWER EXTREMITY DOPPLER STUDY Indications: Non-healing ulcer on the plantar surface of left foot. This is              located at the base between the 4th and 5th metatarsal. The wound              started out as a callus back in the Fall of 2020 and started to              ooze back in 01/2019. She denies claudication and rest pain              symtoms. She also has chronic lower leg swelling and wears support              stockings to manage the swelling. Today she did not wear them and              subsequently both lower calves were swollen. High Risk Factors: Hypertension, no history of smoking.  Comparison Study:  None Performing Technologist: Salvadore Dom RVT  Examination Guidelines: A complete evaluation includes at minimum, Doppler waveform signals and systolic blood pressure reading at the level of bilateral brachial, anterior tibial, and posterior tibial arteries, when vessel segments are accessible. Bilateral testing is considered an integral part of a complete examination. Photoelectric Plethysmograph (PPG) waveforms and toe systolic pressure readings are included as required and additional duplex testing as needed. Limited examinations for reoccurring indications may be performed as noted.  ABI Findings: +---------+------------------+-----+-----------------------------+--------+ Right    Rt Pressure (mmHg)IndexWaveform                     Comment  +---------+------------------+-----+-----------------------------+--------+ Brachial 130                                                          +---------+------------------+-----+-----------------------------+--------+ CFA                             triphasic                             +---------+------------------+-----+-----------------------------+--------+  Popliteal                       triphasic                             +---------+------------------+-----+-----------------------------+--------+ ATA      128               0.98 triphasic                             +---------+------------------+-----+-----------------------------+--------+ PTA      133               1.02 triphasic                             +---------+------------------+-----+-----------------------------+--------+ PERO     122               0.94 barely triphasic, swollen leg         +---------+------------------+-----+-----------------------------+--------+ Great Toe110               0.85 Normal                                +---------+------------------+-----+-----------------------------+--------+  +---------+------------------+-----+------------------------------+-------+ Left     Lt Pressure (mmHg)IndexWaveform                      Comment +---------+------------------+-----+------------------------------+-------+ Brachial 127                                                          +---------+------------------+-----+------------------------------+-------+ CFA                             triphasic                             +---------+------------------+-----+------------------------------+-------+ Popliteal                       triphasic                             +---------+------------------+-----+------------------------------+-------+ ATA      138               1.06 triphasic                             +---------+------------------+-----+------------------------------+-------+ PTA      139               1.07 triphasic                             +---------+------------------+-----+------------------------------+-------+ PERO     137               1.05 barely triphasic, swollen leg.        +---------+------------------+-----+------------------------------+-------+ Great Toe110               0.85  Normal                                +---------+------------------+-----+------------------------------+-------+ +-------+-----------+-----------+------------+------------+ ABI/TBIToday's ABIToday's TBIPrevious ABIPrevious TBI +-------+-----------+-----------+------------+------------+ Right  1.02       .85                                 +-------+-----------+-----------+------------+------------+ Left   1.07       .85                                 +-------+-----------+-----------+------------+------------+  TOES Findings: +----------+---------------+--------+-------+ Right ToesPressure (mmHg)WaveformComment +----------+---------------+--------+-------+ 1st Digit                Normal           +----------+---------------+--------+-------+ 2nd Digit                Normal          +----------+---------------+--------+-------+ 3rd Digit                Normal          +----------+---------------+--------+-------+ 4th Digit                Abnormal        +----------+---------------+--------+-------+ 5th Digit                Abnormal        +----------+---------------+--------+-------+  +---------+---------------+--------+-------+ Left ToesPressure (mmHg)WaveformComment +---------+---------------+--------+-------+ 1st Digit               Normal          +---------+---------------+--------+-------+ 2nd Digit               Abnormal        +---------+---------------+--------+-------+ 3rd Digit               Abnormal        +---------+---------------+--------+-------+ 4th Digit               Normal          +---------+---------------+--------+-------+ 5th Digit               Abnormal        +---------+---------------+--------+-------+    Summary: Right: Resting right ankle-brachial index is within normal range. No evidence of significant right lower extremity arterial disease. The right toe-brachial index is normal. Left: Resting left ankle-brachial index is within normal range. No evidence of significant left lower extremity arterial disease. The left toe-brachial index is normal.  *See table(s) above for measurements and observations.  Electronically signed by Ena Dawley MD on 10/16/2019 at 8:25:16 AM.    Final    IR Radiologist Eval & Mgmt  Result Date: 11/01/2019 Please refer to notes tab for details about interventional procedure. (Op Note)   Labs:  CBC: Recent Labs    10/04/19 1358 11/05/19 1040  WBC 7.8 8.8  HGB 15.1* 14.4  HCT 48.3* 45.0  PLT 228 289    COAGS: Recent Labs    10/04/19 1358  INR 1.1    BMP: No results for input(s): NA, K, CL, CO2, GLUCOSE, BUN, CALCIUM, CREATININE, GFRNONAA, GFRAA in the last 8760  hours.  Invalid input(s): CMP  LIVER FUNCTION TESTS: No results for input(s): BILITOT, AST, ALT, ALKPHOS, PROT, ALBUMIN in the last 8760 hours.  TUMOR  MARKERS: No results for input(s): AFPTM, CEA, CA199, CHROMGRNA in the last 8760 hours.  Assessment and Plan:  Left plantar surface foot lesion Nonhealing/ foul smelling Scheduled for left lower extremity arteriogram with possible intervention Risks and benefits of left lower extremity arteriogram were discussed with the patient including, but not limited to bleeding, infection, vascular injury or contrast induced renal failure.  This interventional procedure involves the use of X-rays and because of the nature of the planned procedure, it is possible that we will have prolonged use of X-ray fluoroscopy.  Potential radiation risks to you include (but are not limited to) the following: - A slightly elevated risk for cancer  several years later in life. This risk is typically less than 0.5% percent. This risk is low in comparison to the normal incidence of human cancer, which is 33% for women and 50% for men according to the [place]. - Radiation induced injury can include skin redness, resembling a rash, tissue breakdown / ulcers and hair loss (which can be temporary or permanent).   The likelihood of either of these occurring depends on the difficulty of the procedure and whether you are sensitive to radiation due to previous procedures, disease, or genetic conditions.   IF your procedure requires a prolonged use of radiation, you will be notified and given written instructions for further action.  It is your responsibility to monitor the irradiated area for the 2 weeks following the procedure and to notify your physician if you are concerned that you have suffered a radiation induced injury.    All of the patient's questions were answered, patient is agreeable to proceed.  Consent signed and in chart.  Thank you for  this interesting consult.  I greatly enjoyed meeting Madelyn Tlatelpa Vazquez and look forward to participating in their care.  A copy of this report was sent to the requesting provider on this date.  Electronically Signed: Lavonia Drafts, PA-C 11/05/2019, 11:04 AM   I spent a total of    25 Minutes in face to face in clinical consultation, greater than 50% of which was counseling/coordinating care for LLE arteriogram with possible intervention

## 2019-11-05 NOTE — Procedures (Signed)
Interventional Radiology Procedure Note  Procedure:    US guided left CFA access, for left lower extremity angiogram.  Exoseal deployed for hemostasis.   Findings: No fem-pop disease No significant tibial arterial disease. Patent AT & PT to the forefoot.  Good wound blush on the angiogram.  The peroneal artery is patent proximal, with variation anatomy distally, small artery terminating in small collaterals.  No target for intervention.  Palpable DP  on start and completion.  PT not palpable, confirmed on Doppler  Complications: None  Recommendations:  - Left hip straight x 4 hours, until DC at 8:30pm - routine wound care - advance diet - restart anti-coagulation on schedule tomorrow morning - Follow up with Podiatry - continue wound care - Do not submerge for 7 days - DC home x 4 hrs 8:30pm  Signed,  Dulcy Fanny. Earleen Newport, DO

## 2019-11-09 ENCOUNTER — Telehealth: Payer: Self-pay

## 2019-11-09 ENCOUNTER — Ambulatory Visit (INDEPENDENT_AMBULATORY_CARE_PROVIDER_SITE_OTHER): Payer: Medicare Other

## 2019-11-09 ENCOUNTER — Other Ambulatory Visit: Payer: Self-pay

## 2019-11-09 ENCOUNTER — Ambulatory Visit: Payer: Medicare Other | Admitting: Podiatry

## 2019-11-09 ENCOUNTER — Encounter: Payer: Self-pay | Admitting: Podiatry

## 2019-11-09 DIAGNOSIS — Z01818 Encounter for other preprocedural examination: Secondary | ICD-10-CM

## 2019-11-09 DIAGNOSIS — L97522 Non-pressure chronic ulcer of other part of left foot with fat layer exposed: Secondary | ICD-10-CM

## 2019-11-09 DIAGNOSIS — I739 Peripheral vascular disease, unspecified: Secondary | ICD-10-CM

## 2019-11-09 MED ORDER — DOXYCYCLINE HYCLATE 100 MG PO TABS: 100.0000 mg | ORAL_TABLET | Freq: Two times a day (BID) | ORAL | 0 refills | Status: DC

## 2019-11-09 NOTE — Telephone Encounter (Signed)
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

## 2019-11-09 NOTE — Progress Notes (Signed)
Subjective:  Patient ID: Christina [name], female    DOB: [birth year],  MRN: [id number]  Chief Complaint  Patient presents with  . Foot Ulcer    the ball of the left foot is swelling more and does have some draining     79 y.o. female presents for wound care.  Patient presents with a follow-up of left submetatarsal 4 ulceration with fat layer exposed.  Patient states that the wound has progressively gotten worse.  She had recent angiogram done by Dr. Corrie Mckusick for which showed good circulation to the left lower extremity.  At this time patient would like to discuss future treatment options she states the wound is not doing good.   Review of Systems: Negative except as noted in the HPI. Denies N/V/F/Ch.  Past Medical History:  Diagnosis Date  . Atypical chest pain    a. 08/2015 Cath: LM nl, LAD 20ost, D1 small/nl, D2 mod, nl, RI small, nl, LCX nl, OM1 small, nl, OM2 mod nl, RCA 69m, EF 55-65%.  . Breast cancer ([place])    H/O LEFT MASTECTOMY IN 2000  . Diverticulitis   . Edema   . Esophageal reflux   . Female bladder prolapse   . H/O: hysterectomy   . Hyperlipidemia   . Hypertension   . Hypokalemia   . Hypothyroidism   . Irregular heartbeat   . Melanoma ([place]) 1991   REMOVED FROM LEFT LEG IN 1991  . Neuropathy   . 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 PAF (paroxysmal atrial fibrillation) ([place])    a. CHA2DS2VASc = 3-->Xarelto since 2015;  b. 08/2015 48h holter in setting of palpitations-->PVC's.  . Palpitations    a. 08/2015 48h Holter: pvc's.  . 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 Post-menopausal   . PVCs (premature ventricular contractions)   . Shingles   . Thyroid cancer ([place])    s/p THYROIDECTOMY  . Whooping cough    as a child    Current Outpatient Medications:  .  acetaminophen (TYLENOL) 500 MG tablet, Take 500-1,000 mg by mouth daily as needed for mild pain or headache., Disp: , Rfl:  .  apixaban (ELIQUIS) 5 MG TABS tablet, Take 1 tablet (5 mg total) by mouth 2  (two) times daily., Disp: 180 tablet, Rfl: 2 .  Calcium-Phosphorus-Vitamin D (CITRACAL +D3 PO), Take 2 tablets by mouth 2 (two) times daily., Disp: , Rfl:  .  clindamycin (CLEOCIN) 300 MG capsule, Take 1 capsule (300 mg total) by mouth 3 (three) times daily. (Patient not taking: Reported on 11/02/2019), Disp: 30 capsule, Rfl: 0 .  diltiazem (CARDIZEM CD) 120 MG 24 hr capsule, TAKE ONE CAPSULE BY MOUTH DAILY (Patient taking differently: Take 120 mg by mouth daily. ), Disp: 90 capsule, Rfl: 1 .  doxycycline (VIBRA-TABS) 100 MG tablet, Take 1 tablet (100 mg total) by mouth 2 (two) times daily., Disp: 28 tablet, Rfl: 0 .  ezetimibe (ZETIA) 10 MG tablet, Take 10 mg by mouth daily., Disp: , Rfl:  .  galantamine (RAZADYNE ER) 24 MG 24 hr capsule, Take 24 mg by mouth daily with breakfast., Disp: , Rfl:  .  Glucosamine-Chondroitin (OSTEO BI-FLEX REGULAR STRENGTH PO), Take 2 tablets by mouth daily after supper., Disp: , Rfl:  .  levothyroxine (SYNTHROID, LEVOTHROID) 150 MCG tablet, Take 150 mcg by mouth See admin instructions. Take 150 mcg before breakfast Monday through Saturday. Skip dose on Sundays, Disp: , Rfl:  .  Multiple Vitamins-Minerals (PRESERVISION AREDS) CAPS, Take 1 capsule  by mouth 2 (two) times daily. , Disp: , Rfl:  .  nebivolol (BYSTOLIC) 5 MG tablet, Take 5 mg by mouth at bedtime. , Disp: , Rfl:  .  Omega-3 Fatty Acids (FISH OIL) 1200 MG CAPS, Take 2,400 mg by mouth daily., Disp: , Rfl:  .  omeprazole (PRILOSEC) 20 MG capsule, Take 20 mg by mouth daily., Disp: , Rfl:  .  potassium chloride SA (K-DUR,KLOR-CON) 20 MEQ tablet, Take 40 mEq by mouth daily. , Disp: , Rfl:  .  simvastatin (ZOCOR) 20 MG tablet, Take 20 mg by mouth at bedtime., Disp: , Rfl:  .  triamterene-hydrochlorothiazide (MAXZIDE-25) 37.5-25 MG tablet, Take 1 tablet by mouth daily., Disp: , Rfl:   Social History   Tobacco Use  Smoking Status Never Smoker  Smokeless Tobacco Never Used    Allergies  Allergen Reactions  .  Carbamazepine Hives and Rash  . Oxaprozin Hives and Rash  . Amoxicillin-Pot Clavulanate Diarrhea and Nausea Only  . Prednisolone     Unknown reaction  . Prednisone Itching and Other (See Comments)    leg swelling /dyuria    Objective:  There were no vitals filed for this visit. There is no height or weight on file to calculate BMI. Constitutional Well developed. Well nourished.  Vascular Dorsalis pedis pulses palpable bilaterally. Posterior tibial pulses palpable bilaterally. Capillary refill normal to all digits.  No cyanosis or clubbing noted. Pedal hair growth normal.  Neurologic Normal speech. Oriented to person, place, and time. Protective sensation absent  Dermatologic Wound Location: Left submetatarsal 4 with fat layer exposed.  Probes down to bone.  No other purulent drainage was expressed.  No clinical signs of infection noted. Wound Base: Mixed Granular/Fibrotic Peri-wound: Calloused Exudate: Scant/small amount Serous exudate Wound Measurements: -See below  Orthopedic: No pain to palpation either foot.   Radiographs: 3 views of skeletally mature adult left foot: No osteomyelitis noted.  No signs of cortical irregularity or destruction of the metatarsal head noted.  No soft tissue emphysema noted.  Soft tissue air correlated with the wound. Assessment:   1. Foot ulcer, left, with fat layer exposed ([place])   2. PAD (peripheral artery disease) ([place])   3. Preoperative examination    Plan:  Patient was evaluated and treated and all questions answered.  Ulcer left submetatarsal 4 ulceration with fat layer exposed -Debridement as below. -Dressed with Betadine wet-to-dry dressing, DSD. -Continue off-loading with surgical shoe. -Patient will be on doxycycline indefinitely and to resolve meant of the ulceration. -ABIs PVRs were reviewed with the patient which showed adequate flow to the lower extremity however patient underwent angiogram with Dr. Corrie Mckusick which showed  good flow without intervention to the lower extremity. No significant tibial arterial disease. Patent AT & PT to the forefoot.  Good wound blush on the angiogram.  -Clinically the wound appears to have worsened and is probing down to bone with exposure of the bone at this time even though radiographically it is not evident for osteomyelitis given that clinically it has regressed I believe patient will benefit from surgical amputation of the fourth ray.  I plan on performing partial fourth ray amputation.  I discussed this with patient extensive detail.  Patient states understanding would like to proceed with a surgical amputation. -Informed surgical risk consent was reviewed and read aloud to the patient.  I reviewed the films.  I have discussed my findings with the patient in great detail.  I have discussed all risks including but not limited  to infection, stiffness, scarring, limp, disability, deformity, damage to blood vessels and nerves, numbness, poor healing, need for braces, arthritis, chronic pain, amputation, death.  All benefits and realistic expectations discussed in great detail.  I have made no promises as to the outcome.  I have provided realistic expectations.  I have offered the patient a 2nd opinion, which they have declined and assured me they preferred to proceed despite the risks -A total of 35 minutes was spent in direct patient care as well as pre and post patient encounter activities.  This includes documentation as well as reviewing patient chart for labs, imaging, past medical, surgical, social, and family history as documented in the EMR.  I have reviewed medication allergies as documented in EMR.  I discussed the etiology of condition and treatment options from conservative to surgical care.  All risks and benefit of the treatment course was discussed in detail.  All questions were answered and return appointment was discussed.  Since the visit completed in an ambulatory/outpatient  setting, the patient and/or parent/guardian has been advised to contact the providers office for worsening condition and seek medical treatment and/or call 911 if the patient deems either is necessary.   Procedure: Excisional Debridement of Wound stagnant Tool: Sharp chisel blade/tissue nipper Rationale: Removal of non-viable soft tissue from the wound to promote healing.  Anesthesia: none Pre-Debridement Wound Measurements: 2 cm x 1.5 cm x 0.5 cm  Post-Debridement Wound Measurements: 2.1 cm x 1.7 cm x 0.6 cm  Type of Debridement: Sharp Excisional Tissue Removed: Non-viable soft tissue Blood loss: Minimal (<50cc) Depth of Debridement: subcutaneous tissue. Technique: Sharp excisional debridement to bleeding, viable wound base.  Wound Progress: Continues to probe deeper and is regressing. Dressing: Dry, sterile, compression dressing. Disposition: Patient tolerated procedure well. Patient to return in 1 week for follow-up.  No follow-ups on file.

## 2019-11-10 ENCOUNTER — Other Ambulatory Visit: Payer: Self-pay | Admitting: Podiatry

## 2019-11-10 ENCOUNTER — Telehealth: Payer: Self-pay | Admitting: Podiatry

## 2019-11-10 MED ORDER — DOXYCYCLINE HYCLATE 100 MG PO TABS: 100.0000 mg | ORAL_TABLET | Freq: Two times a day (BID) | ORAL | 0 refills | Status: DC

## 2019-11-10 NOTE — Telephone Encounter (Signed)
Received a call from Dr. Francesco Sor with [facility] in regards to [place]. She is scheduled for surgery on Monday with Dr. Posey Pronto for toe amputation. Her husband had several questions. I called the patients husband to go over any questions. He is concerned because the foot is getting more swollen. I have recommended them go to the ER but they will not go. She has no fevers chills or systemic concerns. I have sent doxycyline to the pharmacy as she finished antibiotics on Wednesday. In regards to the eliquis per Dr. Francesco Sor she denies any history of stoke or DVT or PE and recently stopped Eliquis for the angiogram. He feels that it is OK for her to stop eliquis for surgery. She took it this morning. I discussed with the patient's husband to go ahead and hold Eliquis for now and Dr. Posey Pronto will inform her when she can restart after surgery. They had no further questions or concerns.

## 2019-11-12 DIAGNOSIS — M86672 Other chronic osteomyelitis, left ankle and foot: Secondary | ICD-10-CM | POA: Diagnosis not present

## 2019-11-12 DIAGNOSIS — D7589 Other specified diseases of blood and blood-forming organs: Secondary | ICD-10-CM | POA: Diagnosis not present

## 2019-11-12 DIAGNOSIS — D2372 Other benign neoplasm of skin of left lower limb, including hip: Secondary | ICD-10-CM | POA: Diagnosis not present

## 2019-11-12 DIAGNOSIS — L97522 Non-pressure chronic ulcer of other part of left foot with fat layer exposed: Secondary | ICD-10-CM | POA: Diagnosis not present

## 2019-11-12 DIAGNOSIS — M86472 Chronic osteomyelitis with draining sinus, left ankle and foot: Secondary | ICD-10-CM | POA: Diagnosis not present

## 2019-11-12 LAB — WOUND CULTURE
MICRO NUMBER:: 10964797
SPECIMEN QUALITY:: ADEQUATE

## 2019-11-12 MED ORDER — OXYCODONE-ACETAMINOPHEN 10-325 MG PO TABS: 1.0000 | ORAL_TABLET | ORAL | 0 refills | Status: DC | PRN

## 2019-11-12 NOTE — Addendum Note (Signed)
Addended by: Boneta Lucks on: 11/12/2019 06:23 AM   Modules accepted: Orders

## 2019-11-14 ENCOUNTER — Ambulatory Visit: Payer: Medicare Other | Admitting: Podiatry

## 2019-11-16 ENCOUNTER — Encounter: Payer: Self-pay | Admitting: Podiatry

## 2019-11-19 DIAGNOSIS — H353211 Exudative age-related macular degeneration, right eye, with active choroidal neovascularization: Secondary | ICD-10-CM | POA: Diagnosis not present

## 2019-11-19 DIAGNOSIS — H353221 Exudative age-related macular degeneration, left eye, with active choroidal neovascularization: Secondary | ICD-10-CM | POA: Diagnosis not present

## 2019-11-21 ENCOUNTER — Other Ambulatory Visit: Payer: Self-pay

## 2019-11-21 ENCOUNTER — Ambulatory Visit (INDEPENDENT_AMBULATORY_CARE_PROVIDER_SITE_OTHER): Payer: Medicare Other | Admitting: Podiatry

## 2019-11-21 DIAGNOSIS — M86072 Acute hematogenous osteomyelitis, left ankle and foot: Secondary | ICD-10-CM

## 2019-11-21 DIAGNOSIS — L97522 Non-pressure chronic ulcer of other part of left foot with fat layer exposed: Secondary | ICD-10-CM

## 2019-11-21 DIAGNOSIS — Z9889 Other specified postprocedural states: Secondary | ICD-10-CM

## 2019-11-22 ENCOUNTER — Encounter: Payer: Self-pay | Admitting: Podiatry

## 2019-11-22 NOTE — Progress Notes (Signed)
Subjective:  Patient ID: Christina [name], female    DOB: June 19, 1940,  MRN: [id number]  Chief Complaint  Patient presents with  . Routine Post Op    POV#1 DOS 9.20.2021 Lt 4th ray amputation partial.     79 y.o. female returns for post-op check.  Patient presents with follow-up postop visit for fourth metatarsal head excision and excision of benign skin lesion.  Patient states she is doing well.  The stitches are intact.  She has been nonweightbearing for the most part except for transfers.  Not much pain.  She has been taking antibiotics without any complaints.  Review of Systems: Negative except as noted in the HPI. Denies N/V/F/Ch.  Past Medical History:  Diagnosis Date  . Atypical chest pain    a. 08/2015 Cath: LM nl, LAD 20ost, D1 small/nl, D2 mod, nl, RI small, nl, LCX nl, OM1 small, nl, OM2 mod nl, RCA 38m, EF 55-65%.  . Breast cancer ([place])    H/O LEFT MASTECTOMY IN 2000  . Diverticulitis   . Edema   . Esophageal reflux   . Female bladder prolapse   . H/O: hysterectomy   . Hyperlipidemia   . Hypertension   . Hypokalemia   . Hypothyroidism   . Irregular heartbeat   . Melanoma ([place]) 1991   REMOVED FROM LEFT LEG IN 1991  . Neuropathy   . 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 PAF (paroxysmal atrial fibrillation) ([place])    a. CHA2DS2VASc = 3-->Xarelto since 2015;  b. 08/2015 48h holter in setting of palpitations-->PVC's.  . Palpitations    a. 08/2015 48h Holter: pvc's.  . 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 Post-menopausal   . PVCs (premature ventricular contractions)   . Shingles   . Thyroid cancer ([place])    s/p THYROIDECTOMY  . Whooping cough    as a child    Current Outpatient Medications:  .  acetaminophen (TYLENOL) 500 MG tablet, Take 500-1,000 mg by mouth daily as needed for mild pain or headache., Disp: , Rfl:  .  apixaban (ELIQUIS) 5 MG TABS tablet, Take 1 tablet (5 mg total) by mouth 2 (two) times daily., Disp: 180 tablet, Rfl: 2 .   Calcium-Phosphorus-Vitamin D (CITRACAL +D3 PO), Take 2 tablets by mouth 2 (two) times daily., Disp: , Rfl:  .  clindamycin (CLEOCIN) 300 MG capsule, Take 1 capsule (300 mg total) by mouth 3 (three) times daily., Disp: 30 capsule, Rfl: 0 .  diltiazem (CARDIZEM CD) 120 MG 24 hr capsule, TAKE ONE CAPSULE BY MOUTH DAILY (Patient taking differently: Take 120 mg by mouth daily. ), Disp: 90 capsule, Rfl: 1 .  doxycycline (VIBRA-TABS) 100 MG tablet, Take 1 tablet (100 mg total) by mouth 2 (two) times daily., Disp: 28 tablet, Rfl: 0 .  doxycycline (VIBRA-TABS) 100 MG tablet, Take 1 tablet (100 mg total) by mouth 2 (two) times daily., Disp: 20 tablet, Rfl: 0 .  ezetimibe (ZETIA) 10 MG tablet, Take 10 mg by mouth daily., Disp: , Rfl:  .  galantamine (RAZADYNE ER) 24 MG 24 hr capsule, Take 24 mg by mouth daily with breakfast., Disp: , Rfl:  .  Glucosamine-Chondroitin (OSTEO BI-FLEX REGULAR STRENGTH PO), Take 2 tablets by mouth daily after supper., Disp: , Rfl:  .  levothyroxine (SYNTHROID, LEVOTHROID) 150 MCG tablet, Take 150 mcg by mouth See admin instructions. Take 150 mcg before breakfast Monday through Saturday. Skip dose on Sundays, Disp: , Rfl:  .  Multiple Vitamins-Minerals (PRESERVISION AREDS) CAPS, Take  1 capsule by mouth 2 (two) times daily. , Disp: , Rfl:  .  nebivolol (BYSTOLIC) 5 MG tablet, Take 5 mg by mouth at bedtime. , Disp: , Rfl:  .  Omega-3 Fatty Acids (FISH OIL) 1200 MG CAPS, Take 2,400 mg by mouth daily., Disp: , Rfl:  .  omeprazole (PRILOSEC) 20 MG capsule, Take 20 mg by mouth daily., Disp: , Rfl:  .  oxyCODONE-acetaminophen (PERCOCET) 10-325 MG tablet, Take 1 tablet by mouth every 4 (four) hours as needed for pain., Disp: 30 tablet, Rfl: 0 .  potassium chloride SA (K-DUR,KLOR-CON) 20 MEQ tablet, Take 40 mEq by mouth daily. , Disp: , Rfl:  .  simvastatin (ZOCOR) 20 MG tablet, Take 20 mg by mouth at bedtime., Disp: , Rfl:  .  triamterene-hydrochlorothiazide (MAXZIDE-25) 37.5-25 MG tablet,  Take 1 tablet by mouth daily., Disp: , Rfl:   Social History   Tobacco Use  Smoking Status Never Smoker  Smokeless Tobacco Never Used    Allergies  Allergen Reactions  . Carbamazepine Hives and Rash  . Oxaprozin Hives and Rash  . Amoxicillin-Pot Clavulanate Diarrhea and Nausea Only  . Prednisolone     Unknown reaction  . Prednisone Itching and Other (See Comments)    leg swelling /dyuria    Objective:  There were no vitals filed for this visit. There is no height or weight on file to calculate BMI. Constitutional Well developed. Well nourished.  Vascular Foot warm and well perfused. Capillary refill normal to all digits.   Neurologic Normal speech. Oriented to person, place, and time. Epicritic sensation to light touch grossly present bilaterally.  Dermatologic Skin healing well without signs of infection. Skin edges well coapted without signs of infection.  Orthopedic: Tenderness to palpation noted about the surgical site.   Radiographs: None Assessment:   1. Foot ulcer, left, with fat layer exposed ([place])   2. Acute hematogenous osteomyelitis of left foot ([facility])   3. Status post foot surgery    Plan:  Patient was evaluated and treated and all questions answered.  S/p foot surgery left -Progressing as expected post-operatively. -XR: None -WB Status: Nonweightbearing to the left lower extremity -Sutures: Intact.  No signs of dehiscence noted no complications noted. -Medications: None -Foot redressed.  No follow-ups on file.

## 2019-11-28 ENCOUNTER — Encounter: Payer: Medicare Other | Admitting: Podiatry

## 2019-12-03 DIAGNOSIS — Z7901 Long term (current) use of anticoagulants: Secondary | ICD-10-CM | POA: Diagnosis not present

## 2019-12-03 DIAGNOSIS — K219 Gastro-esophageal reflux disease without esophagitis: Secondary | ICD-10-CM | POA: Diagnosis not present

## 2019-12-03 DIAGNOSIS — E890 Postprocedural hypothyroidism: Secondary | ICD-10-CM | POA: Diagnosis not present

## 2019-12-03 DIAGNOSIS — Z79899 Other long term (current) drug therapy: Secondary | ICD-10-CM | POA: Diagnosis not present

## 2019-12-03 DIAGNOSIS — I4891 Unspecified atrial fibrillation: Secondary | ICD-10-CM | POA: Diagnosis not present

## 2019-12-03 DIAGNOSIS — Z9889 Other specified postprocedural states: Secondary | ICD-10-CM | POA: Diagnosis not present

## 2019-12-03 DIAGNOSIS — Z9012 Acquired absence of left breast and nipple: Secondary | ICD-10-CM | POA: Diagnosis not present

## 2019-12-03 DIAGNOSIS — Z90710 Acquired absence of both cervix and uterus: Secondary | ICD-10-CM | POA: Diagnosis not present

## 2019-12-03 DIAGNOSIS — I444 Left anterior fascicular block: Secondary | ICD-10-CM | POA: Diagnosis not present

## 2019-12-03 DIAGNOSIS — N8110 Cystocele, unspecified: Secondary | ICD-10-CM | POA: Diagnosis not present

## 2019-12-03 DIAGNOSIS — N815 Vaginal enterocele: Secondary | ICD-10-CM | POA: Diagnosis not present

## 2019-12-03 DIAGNOSIS — J302 Other seasonal allergic rhinitis: Secondary | ICD-10-CM | POA: Diagnosis not present

## 2019-12-03 DIAGNOSIS — E785 Hyperlipidemia, unspecified: Secondary | ICD-10-CM | POA: Diagnosis not present

## 2019-12-03 DIAGNOSIS — I10 Essential (primary) hypertension: Secondary | ICD-10-CM | POA: Diagnosis not present

## 2019-12-05 ENCOUNTER — Ambulatory Visit (INDEPENDENT_AMBULATORY_CARE_PROVIDER_SITE_OTHER): Payer: Medicare Other | Admitting: Podiatry

## 2019-12-05 ENCOUNTER — Other Ambulatory Visit: Payer: Self-pay

## 2019-12-05 DIAGNOSIS — L97522 Non-pressure chronic ulcer of other part of left foot with fat layer exposed: Secondary | ICD-10-CM

## 2019-12-05 DIAGNOSIS — M86072 Acute hematogenous osteomyelitis, left ankle and foot: Secondary | ICD-10-CM

## 2019-12-05 DIAGNOSIS — Z9889 Other specified postprocedural states: Secondary | ICD-10-CM

## 2019-12-06 ENCOUNTER — Encounter: Payer: Self-pay | Admitting: Podiatry

## 2019-12-06 NOTE — Progress Notes (Signed)
Subjective:  Patient ID: Christina [name], female    DOB: 09-02-1940,  MRN: [id number]  Chief Complaint  Patient presents with  . Routine Post Op    PT stated that she has no pain or concerns      79 y.o. female returns for post-op check.  Patient presents with follow-up postop visit for fourth metatarsal head excision and excision of benign skin lesion.  Patient states she is doing well.  The stitches are intact.  She has been nonweightbearing for the most part except for transfers.  Not much pain.  She has been taking antibiotics without any complaints.  Review of Systems: Negative except as noted in the HPI. Denies N/V/F/Ch.  Past Medical History:  Diagnosis Date  . Atypical chest pain    a. 08/2015 Cath: LM nl, LAD 20ost, D1 small/nl, D2 mod, nl, RI small, nl, LCX nl, OM1 small, nl, OM2 mod nl, RCA 61m, EF 55-65%.  . Breast cancer ([place])    H/O LEFT MASTECTOMY IN 2000  . Diverticulitis   . Edema   . Esophageal reflux   . Female bladder prolapse   . H/O: hysterectomy   . Hyperlipidemia   . Hypertension   . Hypokalemia   . Hypothyroidism   . Irregular heartbeat   . Melanoma ([place]) 1991   REMOVED FROM LEFT LEG IN 1991  . Neuropathy   . 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 PAF (paroxysmal atrial fibrillation) ([place])    a. CHA2DS2VASc = 3-->Xarelto since 2015;  b. 08/2015 48h holter in setting of palpitations-->PVC's.  . Palpitations    a. 08/2015 48h Holter: pvc's.  . 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 Post-menopausal   . PVCs (premature ventricular contractions)   . Shingles   . Thyroid cancer ([place])    s/p THYROIDECTOMY  . Whooping cough    as a child    Current Outpatient Medications:  .  acetaminophen (TYLENOL) 500 MG tablet, Take 500-1,000 mg by mouth daily as needed for mild pain or headache., Disp: , Rfl:  .  apixaban (ELIQUIS) 5 MG TABS tablet, Take 1 tablet (5 mg total) by mouth 2 (two) times daily., Disp: 180 tablet, Rfl: 2 .  Calcium-Phosphorus-Vitamin D  (CITRACAL +D3 PO), Take 2 tablets by mouth 2 (two) times daily., Disp: , Rfl:  .  clindamycin (CLEOCIN) 300 MG capsule, Take 1 capsule (300 mg total) by mouth 3 (three) times daily., Disp: 30 capsule, Rfl: 0 .  diltiazem (CARDIZEM CD) 120 MG 24 hr capsule, TAKE ONE CAPSULE BY MOUTH DAILY (Patient taking differently: Take 120 mg by mouth daily. ), Disp: 90 capsule, Rfl: 1 .  doxycycline (VIBRA-TABS) 100 MG tablet, Take 1 tablet (100 mg total) by mouth 2 (two) times daily., Disp: 28 tablet, Rfl: 0 .  doxycycline (VIBRA-TABS) 100 MG tablet, Take 1 tablet (100 mg total) by mouth 2 (two) times daily., Disp: 20 tablet, Rfl: 0 .  ezetimibe (ZETIA) 10 MG tablet, Take 10 mg by mouth daily., Disp: , Rfl:  .  galantamine (RAZADYNE ER) 24 MG 24 hr capsule, Take 24 mg by mouth daily with breakfast., Disp: , Rfl:  .  Glucosamine-Chondroitin (OSTEO BI-FLEX REGULAR STRENGTH PO), Take 2 tablets by mouth daily after supper., Disp: , Rfl:  .  levothyroxine (SYNTHROID, LEVOTHROID) 150 MCG tablet, Take 150 mcg by mouth See admin instructions. Take 150 mcg before breakfast Monday through Saturday. Skip dose on Sundays, Disp: , Rfl:  .  Multiple Vitamins-Minerals (PRESERVISION AREDS)  CAPS, Take 1 capsule by mouth 2 (two) times daily. , Disp: , Rfl:  .  nebivolol (BYSTOLIC) 5 MG tablet, Take 5 mg by mouth at bedtime. , Disp: , Rfl:  .  Omega-3 Fatty Acids (FISH OIL) 1200 MG CAPS, Take 2,400 mg by mouth daily., Disp: , Rfl:  .  omeprazole (PRILOSEC) 20 MG capsule, Take 20 mg by mouth daily., Disp: , Rfl:  .  oxyCODONE-acetaminophen (PERCOCET) 10-325 MG tablet, Take 1 tablet by mouth every 4 (four) hours as needed for pain., Disp: 30 tablet, Rfl: 0 .  potassium chloride SA (K-DUR,KLOR-CON) 20 MEQ tablet, Take 40 mEq by mouth daily. , Disp: , Rfl:  .  simvastatin (ZOCOR) 20 MG tablet, Take 20 mg by mouth at bedtime., Disp: , Rfl:  .  triamterene-hydrochlorothiazide (MAXZIDE-25) 37.5-25 MG tablet, Take 1 tablet by mouth daily.,  Disp: , Rfl:   Social History   Tobacco Use  Smoking Status Never Smoker  Smokeless Tobacco Never Used    Allergies  Allergen Reactions  . Carbamazepine Hives and Rash  . Oxaprozin Hives and Rash  . Amoxicillin-Pot Clavulanate Diarrhea and Nausea Only  . Prednisolone     Unknown reaction  . Prednisone Itching and Other (See Comments)    leg swelling /dyuria    Objective:  There were no vitals filed for this visit. There is no height or weight on file to calculate BMI. Constitutional Well developed. Well nourished.  Vascular Foot warm and well perfused. Capillary refill normal to all digits.   Neurologic Normal speech. Oriented to person, place, and time. Epicritic sensation to light touch grossly present bilaterally.  Dermatologic Skin healing well without signs of infection. Skin edges well coapted without signs of infection.  Orthopedic: Tenderness to palpation noted about the surgical site.   Radiographs: None Assessment:   1. Foot ulcer, left, with fat layer exposed ([place])   2. Acute hematogenous osteomyelitis of left foot ([facility])   3. Status post foot surgery    Plan:  Patient was evaluated and treated and all questions answered.  S/p foot surgery left -Progressing as expected post-operatively. -XR: None -WB Status: Nonweightbearing to the left lower extremity -Sutures: Intact.  No signs of dehiscence noted no complications noted.  I will plan on taking the sutures out during next time. -Medications: None -Foot redressed.  No follow-ups on file.

## 2019-12-12 ENCOUNTER — Encounter: Payer: Medicare Other | Admitting: Podiatry

## 2019-12-14 DIAGNOSIS — D320 Benign neoplasm of cerebral meninges: Secondary | ICD-10-CM | POA: Diagnosis not present

## 2019-12-14 DIAGNOSIS — E039 Hypothyroidism, unspecified: Secondary | ICD-10-CM | POA: Diagnosis not present

## 2019-12-14 DIAGNOSIS — I10 Essential (primary) hypertension: Secondary | ICD-10-CM | POA: Diagnosis not present

## 2019-12-14 DIAGNOSIS — D6869 Other thrombophilia: Secondary | ICD-10-CM | POA: Diagnosis not present

## 2019-12-16 NOTE — Progress Notes (Signed)
Chief Complaint  Patient presents with  . Follow-up    CAD    History of Present Illness: 79 yo female with history of paroxysmal atrial fibrillation, SVT, PVCs, HTN, breast cancer and mild CAD who is here for follow up. She has been known to have paroxysmal atrial fibrillation for years and was started on Xarelto in 2015. She was seen in the ED 11/22/14 with palpitations, nausea and SOB and was found to be in atrial fibrillation with RVR. She converted to sinus in the ED spontaneously. She reported more frequent episodes of palpitations in July 2017 with associated weakness and chest pain. 48 hour monitor July 2017 with PVCs, PACs, and several short runs of SVT. She is now on Eliquis. Cardiac cath 09/18/15 with mild CAD (10% mid RCA stenosis, 20% proximal LAD stenosis). Normal LV systolic function. Most recent echo August 2017 with normal LV size and function, no valve disease. Cardizem was added and she has tolerated. She has had a left foot ulcer. Normal ABI August 2021.    She is here today for follow up. The patient denies any chest pain, dyspnea, palpitations, lower extremity edema, orthopnea, PND, dizziness, near syncope or syncope.    Primary Care Physician: Crist Infante, MD   Past Medical History:  Diagnosis Date  . Atypical chest pain    a. 08/2015 Cath: LM nl, LAD 20ost, D1 small/nl, D2 mod, nl, RI small, nl, LCX nl, OM1 small, nl, OM2 mod nl, RCA 46m, EF 55-65%.  . Breast cancer ([place])    H/O LEFT MASTECTOMY IN 2000  . Diverticulitis   . Edema   . Esophageal reflux   . Female bladder prolapse   . H/O: hysterectomy   . Hyperlipidemia   . Hypertension   . Hypokalemia   . Hypothyroidism   . Irregular heartbeat   . Melanoma ([place]) 1991   REMOVED FROM LEFT LEG IN 1991  . Neuropathy   . 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 PAF (paroxysmal atrial fibrillation) ([place])    a. CHA2DS2VASc = 3-->Xarelto since 2015;  b. 08/2015 48h holter in setting of palpitations-->PVC's.  .  Palpitations    a. 08/2015 48h Holter: pvc's.  . 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 Post-menopausal   . PVCs (premature ventricular contractions)   . Shingles   . Thyroid cancer ([place])    s/p THYROIDECTOMY  . Whooping cough    as a child    Past Surgical History:  Procedure Laterality Date  .  tarsal tunnel release  2002   Right/Left  tarsal tunnel release  . CARDIAC CATHETERIZATION  04/21/07   GLOBAL ESTIMATED EF IS 60 %; NORMAL LV FUNCTION; NORMAL CORONARY ARTERIES; BORDERLINE INCREASE IN THE AORTIC ROOT  . CARDIAC CATHETERIZATION N/A 09/18/2015   Procedure: Left Heart Cath and Coronary Angiography;  Surgeon: Burnell Blanks, MD;  Location: [facility];  Service: Cardiovascular;  Laterality: N/A;  . COLONOSCOPY    . CYSTOSCOPY  2007   anterior repair of cystocele with mesh  . IR ANGIOGRAM EXTREMITY LEFT  11/05/2019  . IR RADIOLOGIST EVAL & MGMT  11/01/2019  . IR US GUIDE VASC ACCESS LEFT  11/05/2019  . MASTECTOMY  2000   LEFT BREAST  . MELANOMA EXCISION  1991   REMOVED FROM LEFT LEG  . NASAL SINUS SURGERY    . THYROIDECTOMY      Current Outpatient Medications  Medication Sig Dispense Refill  . acetaminophen (TYLENOL) 500 MG tablet Take  500-1,000 mg by mouth daily as needed for mild pain or headache.    Marland Kitchen apixaban (ELIQUIS) 5 MG TABS tablet Take 1 tablet (5 mg total) by mouth 2 (two) times daily. 180 tablet 2  . calcium citrate-vitamin D (CITRACAL+D) 315-200 MG-UNIT tablet Take 2 tablets by mouth 2 (two) times daily.    Marland Kitchen diltiazem (CARDIZEM CD) 120 MG 24 hr capsule TAKE ONE CAPSULE BY MOUTH DAILY 90 capsule 1  . ezetimibe (ZETIA) 10 MG tablet Take 10 mg by mouth daily.    Marland Kitchen galantamine (RAZADYNE ER) 24 MG 24 hr capsule Take 24 mg by mouth daily with breakfast.    . Glucosamine-Chondroitin (OSTEO BI-FLEX REGULAR STRENGTH PO) Take 2 tablets by mouth daily after supper.    . levothyroxine (SYNTHROID, LEVOTHROID) 150 MCG tablet Take 150 mcg by mouth See admin instructions.  Take 150 mcg before breakfast Monday through Saturday. Skip dose on Sundays    . Misc Natural Products (OSTEO BI-FLEX ADV TRIPLE ST) TABS Take 2 tablets by mouth at bedtime.    . Multiple Vitamins-Minerals (PRESERVISION AREDS) CAPS Take 1 capsule by mouth 2 (two) times daily.     . nebivolol (BYSTOLIC) 5 MG tablet Take 5 mg by mouth at bedtime.     . Omega-3 Fatty Acids (FISH OIL) 1200 MG CAPS Take 2,400 mg by mouth daily.    Marland Kitchen omeprazole (PRILOSEC) 20 MG capsule Take 20 mg by mouth daily.    Marland Kitchen oxyCODONE-acetaminophen (PERCOCET) 10-325 MG tablet Take 1 tablet by mouth every 4 (four) hours as needed for pain. 30 tablet 0  . potassium chloride SA (K-DUR,KLOR-CON) 20 MEQ tablet Take 40 mEq by mouth daily.     . simvastatin (ZOCOR) 20 MG tablet Take 20 mg by mouth at bedtime.    . triamterene-hydrochlorothiazide (MAXZIDE-25) 37.5-25 MG tablet Take 1 tablet by mouth daily.     No current facility-administered medications for this visit.    Allergies  Allergen Reactions  . Carbamazepine Hives and Rash  . Oxaprozin Hives and Rash  . Amoxicillin-Pot Clavulanate Diarrhea and Nausea Only  . Prednisolone     Unknown reaction  . Prednisone Itching and Other (See Comments)    leg swelling /dyuria     Social History   Socioeconomic History  . Marital status: Married    Spouse name: Not on file  . Number of children: Not on file  . Years of education: Not on file  . Highest education level: Not on file  Occupational History  . Occupation: RETIRED    Fish farm manager: [employer]    Comment: [employer]  Tobacco Use  . Smoking status: Never Smoker  . Smokeless tobacco: Never Used  Vaping Use  . Vaping Use: Never used  Substance and Sexual Activity  . Alcohol use: Yes    Comment: RARE  . Drug use: No  . Sexual activity: Not on file  Other Topics Concern  . Not on file  Social History Narrative   RETIRED FROM [employer]   LIVES @ HOME WITH HER HUSBAND   SHE HAS  CO-AUTHORED BOOKS ON IV ADDITIVES   ETOH RARELY   NON-SMOKER         Social Determinants of Health   Financial Resource Strain:   . Difficulty of Paying Living Expenses: Not on file  Food Insecurity:   . Worried About Charity fundraiser in the Last Year: Not on file  . Ran Out of Food in the Last Year:  Not on file  Transportation Needs:   . Lack of Transportation (Medical): Not on file  . Lack of Transportation (Non-Medical): Not on file  Physical Activity:   . Days of Exercise per Week: Not on file  . Minutes of Exercise per Session: Not on file  Stress:   . Feeling of Stress : Not on file  Social Connections:   . Frequency of Communication with Friends and Family: Not on file  . Frequency of Social Gatherings with Friends and Family: Not on file  . Attends Religious Services: Not on file  . Active Member of Clubs or Organizations: Not on file  . Attends Archivist Meetings: Not on file  . Marital Status: Not on file  Intimate Partner Violence:   . Fear of Current or Ex-Partner: Not on file  . Emotionally Abused: Not on file  . Physically Abused: Not on file  . Sexually Abused: Not on file    Family History  Problem Relation Age of Onset  . Pancreatitis Mother   . Heart disease Mother   . Kidney disease Mother   . Hypertension Father   . Macular degeneration Father   . Heart disease Father   . Breast cancer Neg Hx   . Colon cancer Neg Hx   . Esophageal cancer Neg Hx   . Pancreatic cancer Neg Hx   . Stomach cancer Neg Hx     Review of Systems:  As stated in the HPI and otherwise negative.   BP 124/80   Pulse (!) 58   Ht 5\' 10"  (1.778 m)   Wt 159 lb 9.6 oz (72.4 kg)   SpO2 97%   BMI 22.90 kg/m   Physical Examination:  General: Well developed, well nourished, NAD  HEENT: OP clear, mucus membranes moist  SKIN: warm, dry. No rashes. Neuro: No focal deficits  Musculoskeletal: Muscle strength 5/5 all ext  Psychiatric: Mood and affect normal    Neck: No JVD, no carotid bruits, no thyromegaly, no lymphadenopathy.  Lungs:Clear bilaterally, no wheezes, rhonci, crackles Cardiovascular: Regular rate and rhythm. No murmurs, gallops or rubs. Abdomen:Soft. Bowel sounds present. Non-tender.  Extremities: No lower extremity edema. Pulses are 2 + in the bilateral DP/PT.  Echo August 2017: Left ventricle: The cavity size was normal. Wall thickness was   normal. Systolic function was normal. The estimated ejection   fraction was in the range of 55% to 60%. Wall motion was normal;   there were no regional wall motion abnormalities. Left   ventricular diastolic function parameters were normal. - Atrial septum: No defect or patent foramen ovale was identified.  EKG:  EKG is ordered today. The ekg ordered today demonstrates sinus bradycardia  Recent Labs: 11/05/2019: BUN 28; Creatinine, Ser 1.10; Hemoglobin 13.3; Platelets 289; Potassium 3.6; Sodium 143   Lipid Panel No results found for: CHOL, TRIG, HDL, CHOLHDL, VLDL, LDLCALC, LDLDIRECT   Wt Readings from Last 3 Encounters:  12/17/19 159 lb 9.6 oz (72.4 kg)  11/05/19 140 lb (63.5 kg)  10/04/19 132 lb (59.9 kg)     Other studies Reviewed: Additional studies/ records that were reviewed today include: . Review of the above records demonstrates:    Assessment and Plan:   1. Atrial fibrillation, paroxysmal/PVCs/SVT: Feeling well with rare palpitations. She is in sinus today. Continue Cardizem, Bystolic and Eliquis.      2.CAD without angina: Cath July 2017 with mild CAD. No chest pain. Continue statin and beta blocker. No ASA since she is on  Eliquis.    3. Chronic diastolic CHF: Weight stable (her documented weight is higher today due to the heavy boot on her left ankle). No volume overload on exam. Lasix prn  4. Hyperlipidemia: Lipids followed in primary care. LDL 66. Continue statin and Zetia.   Current medicines are reviewed at length with the patient today.  The patient does  not have concerns regarding medicines.  The following changes have been made:  no change  Labs/ tests ordered today include:   Orders Placed This Encounter  Procedures  . EKG 12-Lead    Disposition:   F/U with me in 12  months  Signed, Lauree Chandler, MD 12/17/2019 10:38 AM    [facility] [address], [place], [place]  [address] Phone: [phone]; Fax: [phone]

## 2019-12-17 ENCOUNTER — Encounter: Payer: Self-pay | Admitting: Cardiovascular Disease

## 2019-12-17 ENCOUNTER — Other Ambulatory Visit: Payer: Self-pay

## 2019-12-17 ENCOUNTER — Ambulatory Visit: Payer: Medicare Other | Admitting: Cardiovascular Disease

## 2019-12-17 VITALS — BP 124/80 | HR 58 | Ht 70.0 in | Wt 159.6 lb

## 2019-12-17 DIAGNOSIS — I2510 Atherosclerotic heart disease of native coronary artery without angina pectoris: Secondary | ICD-10-CM

## 2019-12-17 DIAGNOSIS — I5032 Chronic diastolic (congestive) heart failure: Secondary | ICD-10-CM | POA: Diagnosis not present

## 2019-12-17 DIAGNOSIS — E7800 Pure hypercholesterolemia, unspecified: Secondary | ICD-10-CM | POA: Diagnosis not present

## 2019-12-17 DIAGNOSIS — I480 Paroxysmal atrial fibrillation: Secondary | ICD-10-CM

## 2019-12-17 NOTE — Patient Instructions (Signed)
Medication Instructions:  The current medical regimen is effective;  continue present plan and medications.  *If you need a refill on your cardiac medications before your next appointment, please call your pharmacy*  Follow-Up: At [facility], you and your health needs are our priority.  As part of our continuing mission to provide you with exceptional heart care, we have created designated Provider Care Teams.  These Care Teams include your primary Cardiologist (physician) and Advanced Practice Providers (APPs -  Physician Assistants and Nurse Practitioners) who all work together to provide you with the care you need, when you need it.  We recommend signing up for the patient portal called "MyChart".  Sign up information is provided on this After Visit Summary.  MyChart is used to connect with patients for Virtual Visits (Telemedicine).  Patients are able to view lab/test results, encounter notes, upcoming appointments, etc.  Non-urgent messages can be sent to your provider as well.   To learn more about what you can do with MyChart, go to [email or web address].    Your next appointment:   12 month(s)  The format for your next appointment:   In Person  Provider:   Lauree Chandler, MD   Thank you for choosing [facility]!!

## 2019-12-19 ENCOUNTER — Other Ambulatory Visit: Payer: Self-pay

## 2019-12-19 ENCOUNTER — Ambulatory Visit (INDEPENDENT_AMBULATORY_CARE_PROVIDER_SITE_OTHER): Payer: Medicare Other | Admitting: Podiatry

## 2019-12-19 DIAGNOSIS — Z9889 Other specified postprocedural states: Secondary | ICD-10-CM

## 2019-12-19 DIAGNOSIS — M86072 Acute hematogenous osteomyelitis, left ankle and foot: Secondary | ICD-10-CM

## 2019-12-19 DIAGNOSIS — L97522 Non-pressure chronic ulcer of other part of left foot with fat layer exposed: Secondary | ICD-10-CM

## 2019-12-20 ENCOUNTER — Encounter: Payer: Self-pay | Admitting: Podiatry

## 2019-12-20 NOTE — Progress Notes (Signed)
Subjective:  Patient ID: Christina [name], female    DOB: 18-Oct-1940,  MRN: [id number]  Chief Complaint  Patient presents with  . Routine Post Op    PT stated that she is doing good. Denies pain at this time and has no concerns      79 y.o. female returns for post-op check.  Patient presents with follow-up postop visit for fourth metatarsal head excision and excision of benign skin lesion.  Patient states she is doing well.  The stitches are intact.  She has been nonweightbearing for the most part except for transfers.  Not much pain.  She has been taking antibiotics without any complaints.  Review of Systems: Negative except as noted in the HPI. Denies N/V/F/Ch.  Past Medical History:  Diagnosis Date  . Atypical chest pain    a. 08/2015 Cath: LM nl, LAD 20ost, D1 small/nl, D2 mod, nl, RI small, nl, LCX nl, OM1 small, nl, OM2 mod nl, RCA 76m, EF 55-65%.  . Breast cancer ([place])    H/O LEFT MASTECTOMY IN 2000  . Diverticulitis   . Edema   . Esophageal reflux   . Female bladder prolapse   . H/O: hysterectomy   . Hyperlipidemia   . Hypertension   . Hypokalemia   . Hypothyroidism   . Irregular heartbeat   . Melanoma ([place]) 1991   REMOVED FROM LEFT LEG IN 1991  . Neuropathy   . 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 PAF (paroxysmal atrial fibrillation) ([place])    a. CHA2DS2VASc = 3-->Xarelto since 2015;  b. 08/2015 48h holter in setting of palpitations-->PVC's.  . Palpitations    a. 08/2015 48h Holter: pvc's.  . 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 Post-menopausal   . PVCs (premature ventricular contractions)   . Shingles   . Thyroid cancer ([place])    s/p THYROIDECTOMY  . Whooping cough    as a child    Current Outpatient Medications:  .  acetaminophen (TYLENOL) 500 MG tablet, Take 500-1,000 mg by mouth daily as needed for mild pain or headache., Disp: , Rfl:  .  apixaban (ELIQUIS) 5 MG TABS tablet, Take 1 tablet (5 mg total) by mouth 2 (two) times daily., Disp: 180 tablet,  Rfl: 2 .  calcium citrate-vitamin D (CITRACAL+D) 315-200 MG-UNIT tablet, Take 2 tablets by mouth 2 (two) times daily., Disp: , Rfl:  .  diltiazem (CARDIZEM CD) 120 MG 24 hr capsule, TAKE ONE CAPSULE BY MOUTH DAILY, Disp: 90 capsule, Rfl: 1 .  ezetimibe (ZETIA) 10 MG tablet, Take 10 mg by mouth daily., Disp: , Rfl:  .  galantamine (RAZADYNE ER) 24 MG 24 hr capsule, Take 24 mg by mouth daily with breakfast., Disp: , Rfl:  .  Glucosamine-Chondroitin (OSTEO BI-FLEX REGULAR STRENGTH PO), Take 2 tablets by mouth daily after supper., Disp: , Rfl:  .  levothyroxine (SYNTHROID, LEVOTHROID) 150 MCG tablet, Take 150 mcg by mouth See admin instructions. Take 150 mcg before breakfast Monday through Saturday. Skip dose on Sundays, Disp: , Rfl:  .  Misc Natural Products (OSTEO BI-FLEX ADV TRIPLE ST) TABS, Take 2 tablets by mouth at bedtime., Disp: , Rfl:  .  Multiple Vitamins-Minerals (PRESERVISION AREDS) CAPS, Take 1 capsule by mouth 2 (two) times daily. , Disp: , Rfl:  .  nebivolol (BYSTOLIC) 5 MG tablet, Take 5 mg by mouth at bedtime. , Disp: , Rfl:  .  Omega-3 Fatty Acids (FISH OIL) 1200 MG CAPS, Take 2,400 mg by mouth daily., Disp: ,  Rfl:  .  omeprazole (PRILOSEC) 20 MG capsule, Take 20 mg by mouth daily., Disp: , Rfl:  .  oxyCODONE-acetaminophen (PERCOCET) 10-325 MG tablet, Take 1 tablet by mouth every 4 (four) hours as needed for pain., Disp: 30 tablet, Rfl: 0 .  potassium chloride SA (K-DUR,KLOR-CON) 20 MEQ tablet, Take 40 mEq by mouth daily. , Disp: , Rfl:  .  simvastatin (ZOCOR) 20 MG tablet, Take 20 mg by mouth at bedtime., Disp: , Rfl:  .  triamterene-hydrochlorothiazide (MAXZIDE-25) 37.5-25 MG tablet, Take 1 tablet by mouth daily., Disp: , Rfl:   Social History   Tobacco Use  Smoking Status Never Smoker  Smokeless Tobacco Never Used    Allergies  Allergen Reactions  . Carbamazepine Hives and Rash  . Oxaprozin Hives and Rash  . Amoxicillin-Pot Clavulanate Diarrhea and Nausea Only  .  Prednisolone     Unknown reaction  . Prednisone Itching and Other (See Comments)    leg swelling /dyuria    Objective:  There were no vitals filed for this visit. There is no height or weight on file to calculate BMI. Constitutional Well developed. Well nourished.  Vascular Foot warm and well perfused. Capillary refill normal to all digits.   Neurologic Normal speech. Oriented to person, place, and time. Epicritic sensation to light touch grossly present bilaterally.  Dermatologic  skin is completely epithelialized.  No signs of infection noted.  No recurrence of ulceration noted.  Orthopedic: Tenderness to palpation noted about the surgical site.   Radiographs: None Assessment:   1. Foot ulcer, left, with fat layer exposed ([place])   2. Status post foot surgery   3. Acute hematogenous osteomyelitis of left foot ([place])    Plan:  Patient was evaluated and treated and all questions answered.  S/p foot surgery left -Progressing as expected post-operatively. -XR: None -WB Status: Weightbearing as tolerated into regular shoes -Sutures: None -Medications: None -Clinically she has completely healed.  At this time I have asked her to come back and see me for in 6 weeks for final evaluation of follow-up.  She states understanding  No follow-ups on file.

## 2019-12-26 DIAGNOSIS — H353211 Exudative age-related macular degeneration, right eye, with active choroidal neovascularization: Secondary | ICD-10-CM | POA: Diagnosis not present

## 2019-12-31 DIAGNOSIS — I4891 Unspecified atrial fibrillation: Secondary | ICD-10-CM | POA: Diagnosis not present

## 2019-12-31 DIAGNOSIS — Z9012 Acquired absence of left breast and nipple: Secondary | ICD-10-CM | POA: Diagnosis not present

## 2019-12-31 DIAGNOSIS — N812 Incomplete uterovaginal prolapse: Secondary | ICD-10-CM | POA: Diagnosis not present

## 2019-12-31 DIAGNOSIS — N993 Prolapse of vaginal vault after hysterectomy: Secondary | ICD-10-CM | POA: Diagnosis not present

## 2019-12-31 DIAGNOSIS — N8110 Cystocele, unspecified: Secondary | ICD-10-CM | POA: Diagnosis not present

## 2019-12-31 DIAGNOSIS — E785 Hyperlipidemia, unspecified: Secondary | ICD-10-CM | POA: Diagnosis not present

## 2019-12-31 DIAGNOSIS — Z9889 Other specified postprocedural states: Secondary | ICD-10-CM | POA: Diagnosis not present

## 2019-12-31 DIAGNOSIS — Z90710 Acquired absence of both cervix and uterus: Secondary | ICD-10-CM | POA: Diagnosis not present

## 2019-12-31 DIAGNOSIS — K219 Gastro-esophageal reflux disease without esophagitis: Secondary | ICD-10-CM | POA: Diagnosis not present

## 2019-12-31 DIAGNOSIS — J302 Other seasonal allergic rhinitis: Secondary | ICD-10-CM | POA: Diagnosis not present

## 2019-12-31 DIAGNOSIS — Z79899 Other long term (current) drug therapy: Secondary | ICD-10-CM | POA: Diagnosis not present

## 2019-12-31 DIAGNOSIS — N815 Vaginal enterocele: Secondary | ICD-10-CM | POA: Diagnosis not present

## 2019-12-31 DIAGNOSIS — E890 Postprocedural hypothyroidism: Secondary | ICD-10-CM | POA: Diagnosis not present

## 2019-12-31 DIAGNOSIS — I10 Essential (primary) hypertension: Secondary | ICD-10-CM | POA: Diagnosis not present

## 2019-12-31 DIAGNOSIS — Z7901 Long term (current) use of anticoagulants: Secondary | ICD-10-CM | POA: Diagnosis not present

## 2020-01-28 DIAGNOSIS — N952 Postmenopausal atrophic vaginitis: Secondary | ICD-10-CM | POA: Insufficient documentation

## 2020-01-30 ENCOUNTER — Other Ambulatory Visit: Payer: Self-pay

## 2020-01-30 ENCOUNTER — Ambulatory Visit (INDEPENDENT_AMBULATORY_CARE_PROVIDER_SITE_OTHER): Payer: Medicare Other | Admitting: Podiatry

## 2020-01-30 ENCOUNTER — Ambulatory Visit (INDEPENDENT_AMBULATORY_CARE_PROVIDER_SITE_OTHER): Payer: Medicare Other

## 2020-01-30 DIAGNOSIS — L97522 Non-pressure chronic ulcer of other part of left foot with fat layer exposed: Secondary | ICD-10-CM

## 2020-01-30 DIAGNOSIS — Z9889 Other specified postprocedural states: Secondary | ICD-10-CM

## 2020-02-01 ENCOUNTER — Encounter: Payer: Self-pay | Admitting: Podiatry

## 2020-02-01 NOTE — Progress Notes (Signed)
Subjective:  Patient ID: Christina [name], female    DOB: [birth year],  MRN: [id number]  Chief Complaint  Patient presents with  . Routine Post Op    PT stated that she is doing much better the first few weeks were hard. She is concerned about the discoloration of the 1st and 2nd toenail      79 y.o. female returns for post-op check.  Patient is following up postop visit for fourth metatarsal head resection.  She is doing really well.  No transfer lesions noted.  No other acute concerns.  Review of Systems: Negative except as noted in the HPI. Denies N/V/F/Ch.  Past Medical History:  Diagnosis Date  . Atypical chest pain    a. 08/2015 Cath: LM nl, LAD 20ost, D1 small/nl, D2 mod, nl, RI small, nl, LCX nl, OM1 small, nl, OM2 mod nl, RCA 14m, EF 55-65%.  . Breast cancer ([place])    H/O LEFT MASTECTOMY IN 2000  . Diverticulitis   . Edema   . Esophageal reflux   . Female bladder prolapse   . H/O: hysterectomy   . Hyperlipidemia   . Hypertension   . Hypokalemia   . Hypothyroidism   . Irregular heartbeat   . Melanoma ([place]) 1991   REMOVED FROM LEFT LEG IN 1991  . Neuropathy   . 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 PAF (paroxysmal atrial fibrillation) ([place])    a. CHA2DS2VASc = 3-->Xarelto since 2015;  b. 08/2015 48h holter in setting of palpitations-->PVC's.  . Palpitations    a. 08/2015 48h Holter: pvc's.  . 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 Post-menopausal   . PVCs (premature ventricular contractions)   . Shingles   . Thyroid cancer ([place])    s/p THYROIDECTOMY  . Whooping cough    as a child    Current Outpatient Medications:  .  acetaminophen (TYLENOL) 500 MG tablet, Take 500-1,000 mg by mouth daily as needed for mild pain or headache., Disp: , Rfl:  .  apixaban (ELIQUIS) 5 MG TABS tablet, Take 1 tablet (5 mg total) by mouth 2 (two) times daily., Disp: 180 tablet, Rfl: 2 .  calcium citrate-vitamin D (CITRACAL+D) 315-200 MG-UNIT tablet, Take 2 tablets by mouth 2 (two)  times daily., Disp: , Rfl:  .  diltiazem (CARDIZEM CD) 120 MG 24 hr capsule, TAKE ONE CAPSULE BY MOUTH DAILY, Disp: 90 capsule, Rfl: 1 .  ezetimibe (ZETIA) 10 MG tablet, Take 10 mg by mouth daily., Disp: , Rfl:  .  galantamine (RAZADYNE ER) 24 MG 24 hr capsule, Take 24 mg by mouth daily with breakfast., Disp: , Rfl:  .  Glucosamine-Chondroitin (OSTEO BI-FLEX REGULAR STRENGTH PO), Take 2 tablets by mouth daily after supper., Disp: , Rfl:  .  levothyroxine (SYNTHROID, LEVOTHROID) 150 MCG tablet, Take 150 mcg by mouth See admin instructions. Take 150 mcg before breakfast Monday through Saturday. Skip dose on Sundays, Disp: , Rfl:  .  Misc Natural Products (OSTEO BI-FLEX ADV TRIPLE ST) TABS, Take 2 tablets by mouth at bedtime., Disp: , Rfl:  .  Multiple Vitamins-Minerals (PRESERVISION AREDS) CAPS, Take 1 capsule by mouth 2 (two) times daily. , Disp: , Rfl:  .  nebivolol (BYSTOLIC) 5 MG tablet, Take 5 mg by mouth at bedtime. , Disp: , Rfl:  .  Omega-3 Fatty Acids (FISH OIL) 1200 MG CAPS, Take 2,400 mg by mouth daily., Disp: , Rfl:  .  omeprazole (PRILOSEC) 20 MG capsule, Take 20 mg by mouth daily., Disp: ,  Rfl:  .  oxyCODONE-acetaminophen (PERCOCET) 10-325 MG tablet, Take 1 tablet by mouth every 4 (four) hours as needed for pain., Disp: 30 tablet, Rfl: 0 .  potassium chloride SA (K-DUR,KLOR-CON) 20 MEQ tablet, Take 40 mEq by mouth daily. , Disp: , Rfl:  .  simvastatin (ZOCOR) 20 MG tablet, Take 20 mg by mouth at bedtime., Disp: , Rfl:  .  triamterene-hydrochlorothiazide (MAXZIDE-25) 37.5-25 MG tablet, Take 1 tablet by mouth daily., Disp: , Rfl:   Social History   Tobacco Use  Smoking Status Never Smoker  Smokeless Tobacco Never Used    Allergies  Allergen Reactions  . Carbamazepine Hives and Rash  . Oxaprozin Hives and Rash  . Amoxicillin-Pot Clavulanate Diarrhea and Nausea Only  . Oxycodone-Acetaminophen     Other reaction(s): Chills (intolerance)  . Prednisolone     Unknown reaction  .  Prednisone Itching and Other (See Comments)    leg swelling /dyuria    Objective:  There were no vitals filed for this visit. There is no height or weight on file to calculate BMI. Constitutional Well developed. Well nourished.  Vascular Foot warm and well perfused. Capillary refill normal to all digits.   Neurologic Normal speech. Oriented to person, place, and time. Epicritic sensation to light touch grossly present bilaterally.  Dermatologic  skin is completely epithelialized.  No signs of infection noted.  No recurrence of ulceration noted.  Orthopedic: Tenderness to palpation noted about the surgical site.   Radiographs: None Assessment:   1. Status post foot surgery   2. Foot ulcer, left, with fat layer exposed ([place])    Plan:  Patient was evaluated and treated and all questions answered.  S/p foot surgery left -Progressing as expected post-operatively. -XR: None -WB Status: Weightbearing as tolerated into regular shoes -Sutures: None -Medications: None -Patient is doing well.  She has healed up adequately.  No further wound noted.  I will see her back if any further foot and ankle issues arise in the future.  Patient states understanding  No follow-ups on file.

## 2020-02-04 DIAGNOSIS — H353211 Exudative age-related macular degeneration, right eye, with active choroidal neovascularization: Secondary | ICD-10-CM | POA: Diagnosis not present

## 2020-02-26 ENCOUNTER — Telehealth: Payer: Self-pay | Admitting: Podiatry

## 2020-02-26 NOTE — Telephone Encounter (Signed)
Pt called requesting a darco shoe. I have it ready up front and let them know to come before 5.

## 2020-03-17 DIAGNOSIS — H353211 Exudative age-related macular degeneration, right eye, with active choroidal neovascularization: Secondary | ICD-10-CM | POA: Diagnosis not present

## 2020-03-17 DIAGNOSIS — H353221 Exudative age-related macular degeneration, left eye, with active choroidal neovascularization: Secondary | ICD-10-CM | POA: Diagnosis not present

## 2020-03-21 ENCOUNTER — Other Ambulatory Visit: Payer: Self-pay

## 2020-03-21 ENCOUNTER — Ambulatory Visit: Payer: Medicare Other | Admitting: Podiatry

## 2020-03-21 DIAGNOSIS — S90852A Superficial foreign body, left foot, initial encounter: Secondary | ICD-10-CM | POA: Diagnosis not present

## 2020-03-21 DIAGNOSIS — T148XXA Other injury of unspecified body region, initial encounter: Secondary | ICD-10-CM

## 2020-03-25 ENCOUNTER — Encounter: Payer: Self-pay | Admitting: Podiatry

## 2020-03-25 NOTE — Progress Notes (Signed)
Subjective:  Patient ID: Christina [name], female    DOB: 06-Jun-1940,  MRN: [id number]  Chief Complaint  Patient presents with  . Callouses    Left foot center of the heel.    80 y.o. female presents with the above complaint.  Patient presents with complaint of pain to the left center heel.  Patient states been going on since after Christmas.  She states is very painful to touch and has gotten progressively worse.  She does not recall stepping on anything such as glass.  She denies any other acute complaints.  She is not a diabetic.  She is a poor historian.  She is here with her husband today.  She would like to discuss treatment options for this.   Review of Systems: Negative except as noted in the HPI. Denies N/V/F/Ch.  Past Medical History:  Diagnosis Date  . Atypical chest pain    a. 08/2015 Cath: LM nl, LAD 20ost, D1 small/nl, D2 mod, nl, RI small, nl, LCX nl, OM1 small, nl, OM2 mod nl, RCA 15m, EF 55-65%.  . Breast cancer ([place])    H/O LEFT MASTECTOMY IN 2000  . Diverticulitis   . Edema   . Esophageal reflux   . Female bladder prolapse   . H/O: hysterectomy   . Hyperlipidemia   . Hypertension   . Hypokalemia   . Hypothyroidism   . Irregular heartbeat   . Melanoma ([place]) 1991   REMOVED FROM LEFT LEG IN 1991  . Neuropathy   . 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 PAF (paroxysmal atrial fibrillation) ([place])    a. CHA2DS2VASc = 3-->Xarelto since 2015;  b. 08/2015 48h holter in setting of palpitations-->PVC's.  . Palpitations    a. 08/2015 48h Holter: pvc's.  . 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 Post-menopausal   . PVCs (premature ventricular contractions)   . Shingles   . Thyroid cancer ([place])    s/p THYROIDECTOMY  . Whooping cough    as a child    Current Outpatient Medications:  .  acetaminophen (TYLENOL) 500 MG tablet, Take 500-1,000 mg by mouth daily as needed for mild pain or headache., Disp: , Rfl:  .  apixaban (ELIQUIS) 5 MG TABS tablet, Take 1 tablet (5 mg  total) by mouth 2 (two) times daily., Disp: 180 tablet, Rfl: 2 .  calcium citrate-vitamin D (CITRACAL+D) 315-200 MG-UNIT tablet, Take 2 tablets by mouth 2 (two) times daily., Disp: , Rfl:  .  diltiazem (CARDIZEM CD) 120 MG 24 hr capsule, TAKE ONE CAPSULE BY MOUTH DAILY, Disp: 90 capsule, Rfl: 1 .  ezetimibe (ZETIA) 10 MG tablet, Take 10 mg by mouth daily., Disp: , Rfl:  .  galantamine (RAZADYNE ER) 24 MG 24 hr capsule, Take 24 mg by mouth daily with breakfast., Disp: , Rfl:  .  Glucosamine-Chondroitin (OSTEO BI-FLEX REGULAR STRENGTH PO), Take 2 tablets by mouth daily after supper., Disp: , Rfl:  .  levothyroxine (SYNTHROID, LEVOTHROID) 150 MCG tablet, Take 150 mcg by mouth See admin instructions. Take 150 mcg before breakfast Monday through Saturday. Skip dose on Sundays, Disp: , Rfl:  .  Misc Natural Products (OSTEO BI-FLEX ADV TRIPLE ST) TABS, Take 2 tablets by mouth at bedtime., Disp: , Rfl:  .  Multiple Vitamins-Minerals (PRESERVISION AREDS) CAPS, Take 1 capsule by mouth 2 (two) times daily. , Disp: , Rfl:  .  nebivolol (BYSTOLIC) 5 MG tablet, Take 5 mg by mouth at bedtime. , Disp: , Rfl:  .  Omega-3 Fatty Acids (FISH OIL) 1200 MG CAPS, Take 2,400 mg by mouth daily., Disp: , Rfl:  .  omeprazole (PRILOSEC) 20 MG capsule, Take 20 mg by mouth daily., Disp: , Rfl:  .  oxyCODONE-acetaminophen (PERCOCET) 10-325 MG tablet, Take 1 tablet by mouth every 4 (four) hours as needed for pain., Disp: 30 tablet, Rfl: 0 .  potassium chloride SA (K-DUR,KLOR-CON) 20 MEQ tablet, Take 40 mEq by mouth daily. , Disp: , Rfl:  .  simvastatin (ZOCOR) 20 MG tablet, Take 20 mg by mouth at bedtime., Disp: , Rfl:  .  triamterene-hydrochlorothiazide (MAXZIDE-25) 37.5-25 MG tablet, Take 1 tablet by mouth daily., Disp: , Rfl:   Social History   Tobacco Use  Smoking Status Never Smoker  Smokeless Tobacco Never Used    Allergies  Allergen Reactions  . Carbamazepine Hives and Rash  . Oxaprozin Hives and Rash  .  Amoxicillin-Pot Clavulanate Diarrhea and Nausea Only  . Oxycodone-Acetaminophen     Other reaction(s): Chills (intolerance)  . Prednisolone     Unknown reaction  . Prednisone Itching and Other (See Comments)    leg swelling /dyuria    Objective:  There were no vitals filed for this visit. There is no height or weight on file to calculate BMI. Constitutional Well developed. Well nourished.  Vascular Dorsalis pedis pulses palpable bilaterally. Posterior tibial pulses palpable bilaterally. Capillary refill normal to all digits.  No cyanosis or clubbing noted. Pedal hair growth normal.  Neurologic Normal speech. Oriented to person, place, and time. Epicritic sensation to light touch grossly present bilaterally.  Dermatologic  hyperkeratotic lesion was aggressively debrided down.  There appears to be a puncture wound with foreign body embedded within the superficial soft tissue.  Using pickups and a 15 blade the foreign body was removed without any complication.  No clinical signs of infection noted no purulent drainage noted.  Orthopedic: Normal joint ROM without pain or crepitus bilaterally. No visible deformities. No bony tenderness.   Radiographs: None Assessment:   1. Foreign body in left foot, initial encounter   2. Puncture wound    Plan:  Patient was evaluated and treated and all questions answered.  Left heel foreign body (glass) with underlying puncture wound -Clinically patient does not recall when she could have stepped on the glass.  It may have been present for quite some time as it was embedded to the level of the subcutaneous tissue.  I discussed with the patient that she will benefit from clinic removal right here as it does not appear to be very deep.  Patient agrees with the plan would like to proceed with removal.  At this time no clinical signs of infection were noted.  Therefore I will hold off on antibiotics.  However I discussed with the patient that if there  is any clinical signs of infection to go to the emergency room right away or call me.  She states understanding Procedure left foreign body removal: Betadine was utilized to prep the skin.  One-to-one mixture of 1% lidocaine plain half percent Marcaine plain 3 cc were injected to provide local anesthesia.  Using chisel blade/15 blade and sharp pickups were used in standard technique to enter via the puncture wound and remove the foreign body/glass.  No complications were noted.  I instructed the patient to continue applying Band-Aid and Neosporin until resolve meant.  They state understanding. -She will continue to wear surgical shoe that he is she has gone from here in the past.  No  follow-ups on file.

## 2020-04-10 DIAGNOSIS — E039 Hypothyroidism, unspecified: Secondary | ICD-10-CM | POA: Diagnosis not present

## 2020-04-11 ENCOUNTER — Ambulatory Visit (INDEPENDENT_AMBULATORY_CARE_PROVIDER_SITE_OTHER): Payer: Medicare Other | Admitting: Podiatry

## 2020-04-11 ENCOUNTER — Encounter: Payer: Self-pay | Admitting: Podiatry

## 2020-04-11 ENCOUNTER — Other Ambulatory Visit: Payer: Self-pay

## 2020-04-11 DIAGNOSIS — T148XXA Other injury of unspecified body region, initial encounter: Secondary | ICD-10-CM

## 2020-04-11 DIAGNOSIS — S90852A Superficial foreign body, left foot, initial encounter: Secondary | ICD-10-CM

## 2020-04-11 NOTE — Progress Notes (Signed)
Subjective:  Patient ID: Christina [name], female    DOB: 1940-12-16,  MRN: [id number]  Chief Complaint  Patient presents with  . Foot Ulcer    Left foot bottom of the heel     80 y.o. female presents with the above complaint.  Patient presents with a follow-up of left foreign body removal to the heel.  Patient states that she is doing a lot better.  Her pain has gone down considerably.  She denies any other acute complaints.  They have been doing dressing changes.  She is able to get eval make sure that there is nothing else going on.   Review of Systems: Negative except as noted in the HPI. Denies N/V/F/Ch.  Past Medical History:  Diagnosis Date  . Atypical chest pain    a. 08/2015 Cath: LM nl, LAD 20ost, D1 small/nl, D2 mod, nl, RI small, nl, LCX nl, OM1 small, nl, OM2 mod nl, RCA 68m, EF 55-65%.  . Breast cancer ([place])    H/O LEFT MASTECTOMY IN 2000  . Diverticulitis   . Edema   . Esophageal reflux   . Female bladder prolapse   . H/O: hysterectomy   . Hyperlipidemia   . Hypertension   . Hypokalemia   . Hypothyroidism   . Irregular heartbeat   . Melanoma ([place]) 1991   REMOVED FROM LEFT LEG IN 1991  . Neuropathy   . 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 PAF (paroxysmal atrial fibrillation) ([place])    a. CHA2DS2VASc = 3-->Xarelto since 2015;  b. 08/2015 48h holter in setting of palpitations-->PVC's.  . Palpitations    a. 08/2015 48h Holter: pvc's.  . 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)   . Post-menopausal   . PVCs (premature ventricular contractions)   . Shingles   . Thyroid cancer ([place])    s/p THYROIDECTOMY  . Whooping cough    as a child    Current Outpatient Medications:  .  acetaminophen (TYLENOL) 500 MG tablet, Take 500-1,000 mg by mouth daily as needed for mild pain or headache., Disp: , Rfl:  .  apixaban (ELIQUIS) 5 MG TABS tablet, Take 1 tablet (5 mg total) by mouth 2 (two) times daily., Disp: 180 tablet, Rfl: 2 .  calcium citrate-vitamin D (CITRACAL+D) 315-200  MG-UNIT tablet, Take 2 tablets by mouth 2 (two) times daily., Disp: , Rfl:  .  diltiazem (CARDIZEM CD) 120 MG 24 hr capsule, TAKE ONE CAPSULE BY MOUTH DAILY, Disp: 90 capsule, Rfl: 1 .  ezetimibe (ZETIA) 10 MG tablet, Take 10 mg by mouth daily., Disp: , Rfl:  .  galantamine (RAZADYNE ER) 24 MG 24 hr capsule, Take 24 mg by mouth daily with breakfast., Disp: , Rfl:  .  Glucosamine-Chondroitin (OSTEO BI-FLEX REGULAR STRENGTH PO), Take 2 tablets by mouth daily after supper., Disp: , Rfl:  .  levothyroxine (SYNTHROID, LEVOTHROID) 150 MCG tablet, Take 150 mcg by mouth See admin instructions. Take 150 mcg before breakfast Monday through Saturday. Skip dose on Sundays, Disp: , Rfl:  .  Misc Natural Products (OSTEO BI-FLEX ADV TRIPLE ST) TABS, Take 2 tablets by mouth at bedtime., Disp: , Rfl:  .  Multiple Vitamins-Minerals (PRESERVISION AREDS) CAPS, Take 1 capsule by mouth 2 (two) times daily. , Disp: , Rfl:  .  nebivolol (BYSTOLIC) 5 MG tablet, Take 5 mg by mouth at bedtime. , Disp: , Rfl:  .  Omega-3 Fatty Acids (FISH OIL) 1200 MG CAPS, Take 2,400 mg by mouth daily., Disp: , Rfl:  .  omeprazole (PRILOSEC) 20 MG capsule, Take 20 mg by mouth daily., Disp: , Rfl:  .  oxyCODONE-acetaminophen (PERCOCET) 10-325 MG tablet, Take 1 tablet by mouth every 4 (four) hours as needed for pain., Disp: 30 tablet, Rfl: 0 .  potassium chloride SA (K-DUR,KLOR-CON) 20 MEQ tablet, Take 40 mEq by mouth daily. , Disp: , Rfl:  .  simvastatin (ZOCOR) 20 MG tablet, Take 20 mg by mouth at bedtime., Disp: , Rfl:  .  triamterene-hydrochlorothiazide (MAXZIDE-25) 37.5-25 MG tablet, Take 1 tablet by mouth daily., Disp: , Rfl:   Social History   Tobacco Use  Smoking Status Never Smoker  Smokeless Tobacco Never Used    Allergies  Allergen Reactions  . Carbamazepine Hives and Rash  . Oxaprozin Hives and Rash  . Amoxicillin-Pot Clavulanate Diarrhea and Nausea Only  . Oxycodone-Acetaminophen     Other reaction(s): Chills  (intolerance)  . Prednisolone     Unknown reaction  . Prednisone Itching and Other (See Comments)    leg swelling /dyuria    Objective:  There were no vitals filed for this visit. There is no height or weight on file to calculate BMI. Constitutional Well developed. Well nourished.  Vascular Dorsalis pedis pulses palpable bilaterally. Posterior tibial pulses palpable bilaterally. Capillary refill normal to all digits.  No cyanosis or clubbing noted. Pedal hair growth normal.  Neurologic Normal speech. Oriented to person, place, and time. Epicritic sensation to light touch grossly present bilaterally.  Dermatologic  no further hyperkeratotic lesion.  The puncture wound has completely reepithelialized.  No foreign body noted no clinical signs of infection noted no further wound noted.  Orthopedic: Normal joint ROM without pain or crepitus bilaterally. No visible deformities. No bony tenderness.   Radiographs: None Assessment:   1. Foreign body in left foot, initial encounter   2. Puncture wound    Plan:  Patient was evaluated and treated and all questions answered.  Left heel foreign body (glass) with underlying puncture wound -Clinically reepithelialized without any concern for wound or infection.  At this time I have asked the patient to return to regular shoes without any reservation.  If any foot and ankle issues arise in the future she will come back and see me.  No follow-ups on file.

## 2020-04-18 DIAGNOSIS — D6869 Other thrombophilia: Secondary | ICD-10-CM | POA: Diagnosis not present

## 2020-04-18 DIAGNOSIS — E039 Hypothyroidism, unspecified: Secondary | ICD-10-CM | POA: Diagnosis not present

## 2020-04-18 DIAGNOSIS — D320 Benign neoplasm of cerebral meninges: Secondary | ICD-10-CM | POA: Diagnosis not present

## 2020-04-18 DIAGNOSIS — I10 Essential (primary) hypertension: Secondary | ICD-10-CM | POA: Diagnosis not present

## 2020-04-28 DIAGNOSIS — H353211 Exudative age-related macular degeneration, right eye, with active choroidal neovascularization: Secondary | ICD-10-CM | POA: Diagnosis not present

## 2020-05-22 DIAGNOSIS — I10 Essential (primary) hypertension: Secondary | ICD-10-CM | POA: Diagnosis not present

## 2020-05-22 DIAGNOSIS — E785 Hyperlipidemia, unspecified: Secondary | ICD-10-CM | POA: Diagnosis not present

## 2020-05-22 DIAGNOSIS — I480 Paroxysmal atrial fibrillation: Secondary | ICD-10-CM | POA: Diagnosis not present

## 2020-05-22 DIAGNOSIS — E039 Hypothyroidism, unspecified: Secondary | ICD-10-CM | POA: Diagnosis not present

## 2020-06-07 ENCOUNTER — Other Ambulatory Visit: Payer: Self-pay | Admitting: Cardiovascular Disease

## 2020-06-09 DIAGNOSIS — H353211 Exudative age-related macular degeneration, right eye, with active choroidal neovascularization: Secondary | ICD-10-CM | POA: Diagnosis not present

## 2020-06-09 NOTE — Telephone Encounter (Signed)
Pt's age 80, wt 72.4 kg, SCr 1.1, CrCl 48.48, last ov w/ 12/17/19.

## 2020-06-16 DIAGNOSIS — E039 Hypothyroidism, unspecified: Secondary | ICD-10-CM | POA: Diagnosis not present

## 2020-06-16 DIAGNOSIS — E785 Hyperlipidemia, unspecified: Secondary | ICD-10-CM | POA: Diagnosis not present

## 2020-07-11 DIAGNOSIS — H353211 Exudative age-related macular degeneration, right eye, with active choroidal neovascularization: Secondary | ICD-10-CM | POA: Diagnosis not present

## 2020-07-31 DIAGNOSIS — Z961 Presence of intraocular lens: Secondary | ICD-10-CM | POA: Diagnosis not present

## 2020-07-31 DIAGNOSIS — H353230 Exudative age-related macular degeneration, bilateral, stage unspecified: Secondary | ICD-10-CM | POA: Diagnosis not present

## 2020-08-04 DIAGNOSIS — N3941 Urge incontinence: Secondary | ICD-10-CM | POA: Diagnosis not present

## 2020-08-04 DIAGNOSIS — N952 Postmenopausal atrophic vaginitis: Secondary | ICD-10-CM | POA: Diagnosis not present

## 2020-08-13 DIAGNOSIS — E039 Hypothyroidism, unspecified: Secondary | ICD-10-CM | POA: Diagnosis not present

## 2020-08-13 DIAGNOSIS — C73 Malignant neoplasm of thyroid gland: Secondary | ICD-10-CM | POA: Diagnosis not present

## 2020-08-13 DIAGNOSIS — Z13828 Encounter for screening for other musculoskeletal disorder: Secondary | ICD-10-CM | POA: Diagnosis not present

## 2020-08-13 DIAGNOSIS — C439 Malignant melanoma of skin, unspecified: Secondary | ICD-10-CM | POA: Diagnosis not present

## 2020-08-13 DIAGNOSIS — E785 Hyperlipidemia, unspecified: Secondary | ICD-10-CM | POA: Diagnosis not present

## 2020-08-13 DIAGNOSIS — Z0000 Encounter for general adult medical examination without abnormal findings: Secondary | ICD-10-CM | POA: Diagnosis not present

## 2020-08-13 DIAGNOSIS — Z780 Asymptomatic menopausal state: Secondary | ICD-10-CM | POA: Diagnosis not present

## 2020-08-13 DIAGNOSIS — C50919 Malignant neoplasm of unspecified site of unspecified female breast: Secondary | ICD-10-CM | POA: Diagnosis not present

## 2020-08-28 ENCOUNTER — Other Ambulatory Visit: Payer: Self-pay | Admitting: Internal Medicine

## 2020-08-28 DIAGNOSIS — Z0000 Encounter for general adult medical examination without abnormal findings: Secondary | ICD-10-CM | POA: Diagnosis not present

## 2020-08-28 DIAGNOSIS — D6869 Other thrombophilia: Secondary | ICD-10-CM | POA: Diagnosis not present

## 2020-08-28 DIAGNOSIS — I10 Essential (primary) hypertension: Secondary | ICD-10-CM | POA: Diagnosis not present

## 2020-08-28 DIAGNOSIS — E039 Hypothyroidism, unspecified: Secondary | ICD-10-CM | POA: Diagnosis not present

## 2020-08-28 DIAGNOSIS — Z1231 Encounter for screening mammogram for malignant neoplasm of breast: Secondary | ICD-10-CM

## 2020-08-28 DIAGNOSIS — R82998 Other abnormal findings in urine: Secondary | ICD-10-CM | POA: Diagnosis not present

## 2020-08-28 DIAGNOSIS — Z1212 Encounter for screening for malignant neoplasm of rectum: Secondary | ICD-10-CM | POA: Diagnosis not present

## 2020-08-28 DIAGNOSIS — C439 Malignant melanoma of skin, unspecified: Secondary | ICD-10-CM | POA: Diagnosis not present

## 2020-09-01 DIAGNOSIS — H353211 Exudative age-related macular degeneration, right eye, with active choroidal neovascularization: Secondary | ICD-10-CM | POA: Diagnosis not present

## 2020-09-01 DIAGNOSIS — H353221 Exudative age-related macular degeneration, left eye, with active choroidal neovascularization: Secondary | ICD-10-CM | POA: Diagnosis not present

## 2020-09-21 DIAGNOSIS — I10 Essential (primary) hypertension: Secondary | ICD-10-CM | POA: Diagnosis not present

## 2020-09-21 DIAGNOSIS — E039 Hypothyroidism, unspecified: Secondary | ICD-10-CM | POA: Diagnosis not present

## 2020-09-21 DIAGNOSIS — E785 Hyperlipidemia, unspecified: Secondary | ICD-10-CM | POA: Diagnosis not present

## 2020-09-21 DIAGNOSIS — K219 Gastro-esophageal reflux disease without esophagitis: Secondary | ICD-10-CM | POA: Diagnosis not present

## 2020-10-09 IMAGING — US IR ANGIO/EXT/UNI*L*
1 series · 1 of 1 positions shown · non-contrast
Comparison: none

INDICATION: 79-year-old female with a history of left foot wound, referred for
angiogram and possible intervention

[Series 1: (id) · 1 of 1 slices shown]
[im 1/1]
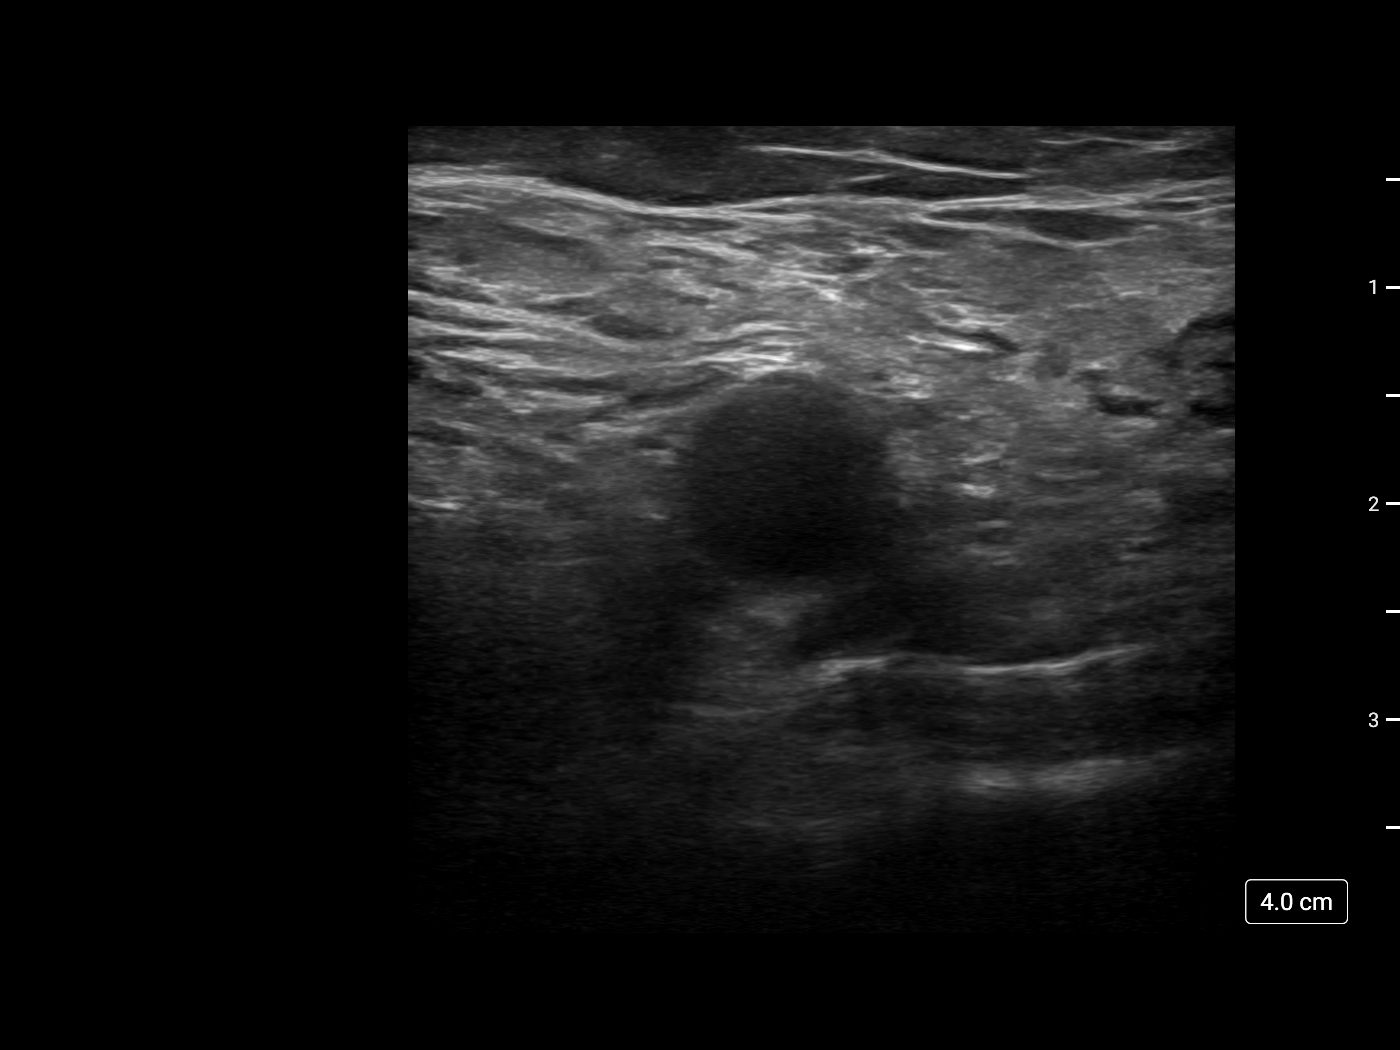

[1 of 1 positions shown; findings below may reference images not displayed]

EXAM:
LEFT EXTREMITY ARTERIOGRAPHY; IR ULTRASOUND GUIDANCE VASC ACCESS
LEFT

MEDICATIONS:
300 mg Plavix.

ANESTHESIA/SEDATION:
Moderate (conscious) sedation was employed during this procedure. A
total of Versed 1.0 mg and Fentanyl 50 mcg was administered
intravenously.

Moderate Sedation Time: 18 minutes. The patient's level of
consciousness and vital signs were monitored continuously by
radiology nursing throughout the procedure under my direct
supervision.

CONTRAST:  30mL VISIPAQUE IODIXANOL 320 MG/ML IV SOLN

FLUOROSCOPY TIME:  Fluoroscopy Time: 0 minutes 28 seconds (16 mGy).

COMPLICATIONS:
None



Ultrasound survey of the left inguinal region was performed with
images stored and sent to PACs, confirming patency of the vessel.

1% lidocaine was used for local anesthesia. Stab incision was made
with 11 blade scalpel. Blunt dissection was performed under
ultrasound guidance. A micropuncture needle was used access the
common femoral artery under ultrasound. With excellent arterial
blood flow returned, and an .018 micro wire was passed through the
needle, observed enter the SFA under fluoroscopy. The needle was
removed, and a micropuncture sheath was placed over the wire. The
inner dilator and wire were removed, and an 035 Bentson wire was
advanced under fluoroscopy into the SFA. The sheath was removed and
a standard 5 French vascular sheath was placed. The dilator was
removed and the sheath was flushed.

Angiogram was performed of the left lower extremity, from the
sheath, as well as from a standard Kumpe the catheter in the distal
SFA.

After review of the images, all catheters wires were removed and
Exoseal was deployed for hemostasis.

Dermabond was placed at the puncture site.

Patient tolerated the procedure well and remained hemodynamically
stable throughout.

No complications were encountered and no significant blood loss.
FINDINGS: Ultrasound demonstrates patent common femoral artery

Angiogram of the left lower extremity demonstrates no significant
atherosclerotic changes of the femoropopliteal segment. Patency of
the geniculate arteries into the lower leg. No aneurysm of the
popliteal artery.

Trifurcation of the tibial arteries is patent. Normal course caliber
and contour of the anterior tibial artery, tibioperoneal trunk,
posterior tibial artery. Peroneal artery is patent and terminates in
a small caliber variant in the distal lower leg. Pedal loop is
patent from the anterior tibial artery, filling the metatarsal
arteries.

Posterior tibial artery contributes to common plantar artery, medial
plantar artery, of small caliber, and a lateral plantar artery

No target for tibial or proximal pedal intervention.
IMPRESSION: Status post ultrasound-guided access left common femoral artery for
left lower extremity angiogram.

Exoseal for hemostasis.

## 2020-10-14 DIAGNOSIS — L03116 Cellulitis of left lower limb: Secondary | ICD-10-CM | POA: Diagnosis not present

## 2020-10-14 DIAGNOSIS — K5900 Constipation, unspecified: Secondary | ICD-10-CM | POA: Diagnosis not present

## 2020-10-14 DIAGNOSIS — E039 Hypothyroidism, unspecified: Secondary | ICD-10-CM | POA: Diagnosis not present

## 2020-10-14 DIAGNOSIS — R194 Change in bowel habit: Secondary | ICD-10-CM | POA: Diagnosis not present

## 2020-10-15 ENCOUNTER — Other Ambulatory Visit: Payer: Self-pay

## 2020-10-15 ENCOUNTER — Ambulatory Visit: Payer: Medicare Other | Admitting: Podiatry

## 2020-10-15 DIAGNOSIS — I739 Peripheral vascular disease, unspecified: Secondary | ICD-10-CM | POA: Diagnosis not present

## 2020-10-15 DIAGNOSIS — L97522 Non-pressure chronic ulcer of other part of left foot with fat layer exposed: Secondary | ICD-10-CM

## 2020-10-15 DIAGNOSIS — L539 Erythematous condition, unspecified: Secondary | ICD-10-CM | POA: Diagnosis not present

## 2020-10-15 MED ORDER — DOXYCYCLINE HYCLATE 100 MG PO TABS: 100.0000 mg | ORAL_TABLET | Freq: Two times a day (BID) | ORAL | 0 refills | Status: AC

## 2020-10-16 ENCOUNTER — Encounter: Payer: Self-pay | Admitting: Podiatry

## 2020-10-16 NOTE — Progress Notes (Signed)
Subjective:  Patient ID: Christina [name], female    DOB: Nov 14, 1940,  MRN: [id number]  Chief Complaint  Patient presents with   Callouses    Left foot callus     80 y.o. female presents for wound care.  Patient presents with new complaint of left submetatarsal 5 ulceration.  Patient states this is a new ulcer which has progressed to gotten worse.  She is currently taking doxycycline that she got her primary care physician.  There is some redness associated with it.  They wanted to get evaluated make sure that there is nothing going on.  She denies any other acute complaints.  She is not a diabetic.   Review of Systems: Negative except as noted in the HPI. Denies N/V/F/Ch.  Past Medical History:  Diagnosis Date   Atypical chest pain    a. 08/2015 Cath: LM nl, LAD 20ost, D1 small/nl, D2 mod, nl, RI small, nl, LCX nl, OM1 small, nl, OM2 mod nl, RCA 27m EF 55-65%.   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 CHA2DS2VASc = 3-->Xarelto since 2015;  b. 08/2015 48h holter in setting of palpitations-->PVC's.   Palpitations    a. 08/2015 48h Holter: pvc's.   Papillary carcinoma ([facility])    Post-menopausal    PVCs (premature ventricular contractions)    Shingles    Thyroid cancer ([facility])    s/p THYROIDECTOMY   Whooping cough    as a child    Current Outpatient Medications:    doxycycline (VIBRA-TABS) 100 MG tablet, Take 1 tablet (100 mg total) by mouth 2 (two) times daily for 7 days., Disp: 14 tablet, Rfl: 0   acetaminophen (TYLENOL) 500 MG tablet, Take 500-1,000 mg by mouth daily as needed for mild pain or headache., Disp: ,  Rfl:    calcium citrate-vitamin D (CITRACAL+D) 315-200 MG-UNIT tablet, Take 2 tablets by mouth 2 (two) times daily., Disp: , Rfl:    diltiazem (CARDIZEM CD) 120 MG 24 hr capsule, TAKE ONE CAPSULE BY MOUTH DAILY, Disp: 90 capsule, Rfl: 1   ELIQUIS 5 MG TABS tablet, TAKE 1 TABLET BY MOUTH TWICE A DAY, Disp: 180 tablet, Rfl: 1   ezetimibe (ZETIA) 10 MG tablet, Take 10 mg by mouth daily., Disp: , Rfl:    galantamine (RAZADYNE ER) 24 MG 24 hr capsule, Take 24 mg by mouth daily with breakfast., Disp: , Rfl:    Glucosamine-Chondroitin (OSTEO BI-FLEX REGULAR STRENGTH PO), Take 2 tablets by mouth daily after supper., Disp: , Rfl:    levothyroxine (SYNTHROID, LEVOTHROID) 150 MCG tablet, Take 150 mcg by mouth See admin instructions. Take 150 mcg before breakfast Monday through Saturday. Skip dose on Sundays, Disp: , Rfl:    Misc Natural Products (OSTEO BI-FLEX ADV TRIPLE ST) TABS, Take 2 tablets by mouth at bedtime., Disp: , Rfl:    Multiple Vitamins-Minerals (PRESERVISION AREDS) CAPS, Take 1 capsule by mouth 2 (two) times daily. , Disp: , Rfl:    nebivolol (BYSTOLIC) 5 MG tablet, Take 5 mg by  mouth at bedtime. , Disp: , Rfl:    Omega-3 Fatty Acids (FISH OIL) 1200 MG CAPS, Take 2,400 mg by mouth daily., Disp: , Rfl:    omeprazole (PRILOSEC) 20 MG capsule, Take 20 mg by mouth daily., Disp: , Rfl:    oxyCODONE-acetaminophen (PERCOCET) 10-325 MG tablet, Take 1 tablet by mouth every 4 (four) hours as needed for pain., Disp: 30 tablet, Rfl: 0   potassium chloride SA (K-DUR,KLOR-CON) 20 MEQ tablet, Take 40 mEq by mouth daily. , Disp: , Rfl:    simvastatin (ZOCOR) 20 MG tablet, Take 20 mg by mouth at bedtime., Disp: , Rfl:    triamterene-hydrochlorothiazide (MAXZIDE-25) 37.5-25 MG tablet, Take 1 tablet by mouth daily., Disp: , Rfl:   Social History   Tobacco Use  Smoking Status Never  Smokeless Tobacco Never    Allergies  Allergen Reactions   Carbamazepine Hives and Rash   Oxaprozin Hives and Rash    Amoxicillin-Pot Clavulanate Diarrhea and Nausea Only   Oxycodone-Acetaminophen     Other reaction(s): Chills (intolerance)   Prednisolone     Unknown reaction   Prednisone Itching and Other (See Comments)    leg swelling /dyuria    Objective:  There were no vitals filed for this visit. There is no height or weight on file to calculate BMI. Constitutional Well developed. Well nourished.  Vascular Dorsalis pedis pulses palpable bilaterally. Posterior tibial pulses palpable bilaterally. Capillary refill normal to all digits.  No cyanosis or clubbing noted. Pedal hair growth normal.  Neurologic Normal speech. Oriented to person, place, and time. Protective sensation absent  Dermatologic Wound Location: Left submetatarsal 5 ulceration with fat layer exposed.  Superficial purulent drainage noted.  No malodor present.  Does not probe down to deep tissue Wound Base: Mixed Granular/Fibrotic Peri-wound: Reddened Exudate: Scant/small amount Serosanguinous exudate Wound Measurements: -See below  Orthopedic: No pain to palpation either foot.   Radiographs: See below Assessment:   1. Erythema   2. Foot ulcer, left, with fat layer exposed ([place])   3. PAD (peripheral artery disease) ([place])    Plan:  Patient was evaluated and treated and all questions answered.  Ulcer left submetatarsal 5 ulcer with fat layer exposed -Debridement as below. -Dressed with Betadine wet-to-dry, DSD. -Continue off-loading with surgical shoe. -Doxycycline was extended for another 7-day course for total time of 14 days  Procedure: Excisional Debridement of Wound Tool: Sharp chisel blade/tissue nipper Rationale: Removal of non-viable soft tissue from the wound to promote healing.  Anesthesia: none Pre-Debridement Wound Measurements: 2.5 cm x 1.3 cm x 0.3 cm  Post-Debridement Wound Measurements: 2.6 cm x 1.5 cm x 0.3 cm  Type of Debridement: Sharp Excisional Tissue Removed: Non-viable soft tissue Blood  loss: Minimal (<50cc) Depth of Debridement: subcutaneous tissue. Technique: Sharp excisional debridement to bleeding, viable wound base.  Wound Progress: This is my initial evaluation I will continue monitor the progression of the wound Site healing conversation 7 Dressing: Dry, sterile, compression dressing. Disposition: Patient tolerated procedure well. Patient to return in 1 week for follow-up.  No follow-ups on file.

## 2020-10-22 ENCOUNTER — Ambulatory Visit
Admission: RE | Admit: 2020-10-22 | Discharge: 2020-10-22 | Disposition: A | Discharge status: Home / Self Care | Payer: Medicare Other | Source: Ambulatory Visit | Attending: Internal Medicine | Admitting: Internal Medicine

## 2020-10-22 ENCOUNTER — Other Ambulatory Visit: Payer: Self-pay

## 2020-10-22 DIAGNOSIS — Z1231 Encounter for screening mammogram for malignant neoplasm of breast: Secondary | ICD-10-CM | POA: Diagnosis not present

## 2020-10-24 DIAGNOSIS — Z85820 Personal history of malignant melanoma of skin: Secondary | ICD-10-CM | POA: Diagnosis not present

## 2020-10-24 DIAGNOSIS — C44712 Basal cell carcinoma of skin of right lower limb, including hip: Secondary | ICD-10-CM | POA: Diagnosis not present

## 2020-10-24 DIAGNOSIS — D225 Melanocytic nevi of trunk: Secondary | ICD-10-CM | POA: Diagnosis not present

## 2020-10-24 DIAGNOSIS — D224 Melanocytic nevi of scalp and neck: Secondary | ICD-10-CM | POA: Diagnosis not present

## 2020-10-24 DIAGNOSIS — C44229 Squamous cell carcinoma of skin of left ear and external auricular canal: Secondary | ICD-10-CM | POA: Diagnosis not present

## 2020-10-24 DIAGNOSIS — Z85828 Personal history of other malignant neoplasm of skin: Secondary | ICD-10-CM | POA: Diagnosis not present

## 2020-10-28 DIAGNOSIS — H353211 Exudative age-related macular degeneration, right eye, with active choroidal neovascularization: Secondary | ICD-10-CM | POA: Diagnosis not present

## 2020-10-29 ENCOUNTER — Ambulatory Visit: Payer: Medicare Other | Admitting: Podiatry

## 2020-10-29 ENCOUNTER — Other Ambulatory Visit: Payer: Self-pay

## 2020-10-29 DIAGNOSIS — I739 Peripheral vascular disease, unspecified: Secondary | ICD-10-CM | POA: Diagnosis not present

## 2020-10-29 DIAGNOSIS — M216X2 Other acquired deformities of left foot: Secondary | ICD-10-CM | POA: Diagnosis not present

## 2020-10-29 DIAGNOSIS — Z9889 Other specified postprocedural states: Secondary | ICD-10-CM | POA: Diagnosis not present

## 2020-10-31 NOTE — Progress Notes (Signed)
Subjective:  Patient ID: Christina [name], female    DOB: Jul 03, 1940,  MRN: [id number]  Chief Complaint  Patient presents with   Foot Ulcer    Left foot ulcer     80 y.o. female presents for wound care.  Patient presents with follow-up of ulceration to submetatarsal 5.  She states that she has been doing Betadine wet-to-dry dressing changes and keeping covered.  The wound has completely really epithelialized.  They would like to discuss surgical options for prevention.   Review of Systems: Negative except as noted in the HPI. Denies N/V/F/Ch.  Past Medical History:  Diagnosis Date   Atypical chest pain    a. 08/2015 Cath: LM nl, LAD 20ost, D1 small/nl, D2 mod, nl, RI small, nl, LCX nl, OM1 small, nl, OM2 mod nl, RCA 50m EF 55-65%.   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 CHA2DS2VASc = 3-->Xarelto since 2015;  b. 08/2015 48h holter in setting of palpitations-->PVC's.   Palpitations    a. 08/2015 48h Holter: pvc's.   Papillary carcinoma (HCC)    Post-menopausal    PVCs (premature ventricular contractions)    Shingles    Thyroid cancer (HCC)    s/p THYROIDECTOMY   Whooping cough    as a child    Current Outpatient Medications:    acetaminophen (TYLENOL) 500 MG tablet, Take 500-1,000 mg by mouth daily as needed for mild pain or headache., Disp: , Rfl:    calcium citrate-vitamin D (CITRACAL+D) 315-200 MG-UNIT tablet, Take 2 tablets by mouth 2 (two) times daily., Disp: , Rfl:    diltiazem (CARDIZEM CD) 120 MG 24 hr capsule, TAKE ONE CAPSULE BY MOUTH DAILY, Disp: 90 capsule, Rfl: 1   ELIQUIS 5 MG TABS tablet, TAKE  1 TABLET BY MOUTH TWICE A DAY, Disp: 180 tablet, Rfl: 1   ezetimibe (ZETIA) 10 MG tablet, Take 10 mg by mouth daily., Disp: , Rfl:    galantamine (RAZADYNE ER) 24 MG 24 hr capsule, Take 24 mg by mouth daily with breakfast., Disp: , Rfl:    Glucosamine-Chondroitin (OSTEO BI-FLEX REGULAR STRENGTH PO), Take 2 tablets by mouth daily after supper., Disp: , Rfl:    levothyroxine (SYNTHROID, LEVOTHROID) 150 MCG tablet, Take 150 mcg by mouth See admin instructions. Take 150 mcg before breakfast Monday through Saturday. Skip dose on Sundays, Disp: , Rfl:    Misc Natural Products (OSTEO BI-FLEX ADV TRIPLE ST) TABS, Take 2 tablets by mouth at bedtime., Disp: , Rfl:    Multiple Vitamins-Minerals (PRESERVISION AREDS) CAPS, Take 1 capsule by mouth 2 (two) times daily. , Disp: , Rfl:    nebivolol (BYSTOLIC) 5 MG tablet, Take 5 mg by mouth at bedtime. , Disp: , Rfl:    Omega-3 Fatty Acids (FISH OIL) 1200 MG CAPS, Take 2,400 mg by mouth daily., Disp: , Rfl:    omeprazole (PRILOSEC) 20 MG capsule, Take 20 mg by mouth daily., Disp: , Rfl:    oxyCODONE-acetaminophen (PERCOCET) 10-325 MG tablet, Take 1 tablet by mouth  every 4 (four) hours as needed for pain., Disp: 30 tablet, Rfl: 0   potassium chloride SA (K-DUR,KLOR-CON) 20 MEQ tablet, Take 40 mEq by mouth daily. , Disp: , Rfl:    simvastatin (ZOCOR) 20 MG tablet, Take 20 mg by mouth at bedtime., Disp: , Rfl:    triamterene-hydrochlorothiazide (MAXZIDE-25) 37.5-25 MG tablet, Take 1 tablet by mouth daily., Disp: , Rfl:   Social History   Tobacco Use  Smoking Status Never  Smokeless Tobacco Never    Allergies  Allergen Reactions   Carbamazepine Hives and Rash   Oxaprozin Hives and Rash   Amoxicillin-Pot Clavulanate Diarrhea and Nausea Only   Oxycodone-Acetaminophen     Other reaction(s): Chills (intolerance)   Prednisolone     Unknown reaction   Prednisone Itching and Other (See Comments)    leg swelling /dyuria    Objective:  There were no vitals  filed for this visit. There is no height or weight on file to calculate BMI. Constitutional Well developed. Well nourished.  Vascular Dorsalis pedis pulses palpable bilaterally. Posterior tibial pulses palpable bilaterally. Capillary refill normal to all digits.  No cyanosis or clubbing noted. Pedal hair growth normal.  Neurologic Normal speech. Oriented to person, place, and time. Protective sensation absent  Dermatologic Left submetatarsal 5 ulceration completely epithelialized.  Left plantarflexed fifth metatarsal noted with pain on palpation submetatarsal 5.  No pain with range of motion of the fifth metatarsophalangeal joint.  Orthopedic: No pain to palpation either foot.   Radiographs: See below Assessment:   No diagnosis found.  Plan:  Patient was evaluated and treated and all questions answered.  Ulcer left submetatarsal 5 ulcer with fat layer exposed -Clinically healed  Plantarflexed left submetatarsal 5 -I explained to the patient the etiology of plantarflexed metatarsal and various treatment options were discussed.  Given that this is putting excessive stress on the submetatarsal 5 leading to ulceration I believe she will benefit from surgical floating osteotomy.  I discussed with her that this will help take the pressure off of the submetatarsal 5.  Patient agrees with the plan like to proceed with surgical floating osteotomy of the fifth metatarsal.  I discussed my preoperative intraoperative postoperative plan with extensive detail with both patient and her husband.  Patient states understanding and would like to proceed with that surgery. -Informed surgical risk consent was reviewed and read aloud to the patient.  I reviewed the films.  I have discussed my findings with the patient in great detail.  I have discussed all risks including but not limited to infection, stiffness, scarring, limp, disability, deformity, damage to blood vessels and nerves, numbness, poor  healing, need for braces, arthritis, chronic pain, amputation, death.  All benefits and realistic expectations discussed in great detail.  I have made no promises as to the outcome.  I have provided realistic expectations.  I have offered the patient a 2nd opinion, which they have declined and assured me they preferred to proceed despite the risks -Patient does have a history of peripheral artery disease however patient has undergone previous surgery with me and healed without acute issues.  However I did discuss with the patient there is a risk of wound dehiscence and loss of foot or leg.  They state understand like to proceed despite the risks  No follow-ups on file.

## 2020-11-04 ENCOUNTER — Other Ambulatory Visit: Payer: Self-pay | Admitting: Physician Assistant

## 2020-11-04 DIAGNOSIS — K6289 Other specified diseases of anus and rectum: Secondary | ICD-10-CM

## 2020-11-04 DIAGNOSIS — R194 Change in bowel habit: Secondary | ICD-10-CM

## 2020-11-10 ENCOUNTER — Other Ambulatory Visit: Payer: Self-pay

## 2020-11-10 ENCOUNTER — Encounter: Payer: Self-pay | Admitting: Physical Therapy

## 2020-11-10 ENCOUNTER — Ambulatory Visit: Payer: Medicare Other | Attending: Physician Assistant | Admitting: Physical Therapy

## 2020-11-10 DIAGNOSIS — M6281 Muscle weakness (generalized): Secondary | ICD-10-CM | POA: Diagnosis not present

## 2020-11-10 DIAGNOSIS — R279 Unspecified lack of coordination: Secondary | ICD-10-CM | POA: Diagnosis not present

## 2020-11-10 DIAGNOSIS — R293 Abnormal posture: Secondary | ICD-10-CM | POA: Diagnosis not present

## 2020-11-10 NOTE — Patient Instructions (Addendum)
About Constipation  Constipation Overview Constipation is the most common gastrointestinal complaint -- about 4 million Americans experience constipation and make 2.5 million physician visits a year to get help for the problem.  Constipation can occur when the colon absorbs too much water, the colon's muscle contraction is slow or sluggish, and/or there is delayed transit time through the colon.  The result is stool that is hard and dry.  Indicators of constipation include straining during bowel movements greater than 25% of the time, having fewer than three bowel movements per week, and/or the feeling of incomplete evacuation.  There are established guidelines ([place] ) for defining constipation. A person needs to have two or more of the following symptoms for at least 12 weeks (not necessarily consecutive) in the preceding 12 months: Straining in  greater than 25% of bowel movements Lumpy or hard stools in greater than 25% of bowel movements Sensation of incomplete emptying in greater than 25% of bowel movements Sensation of anorectal obstruction/blockade in greater than 25% of bowel movements Manual maneuvers to help empty greater than 25% of bowel movements (e.g., digital evacuation, support of the pelvic floor)  Less than  3 bowel movements/week Loose stools are not present, and criteria for irritable bowel syndrome are insufficient Common Causes of Constipation lack of fiber in your diet lack of physical activity medications, including iron and calcium supplements  dairy intake dehydration abuse of laxatives travel Irritable Bowel Syndrome pregnancy luteal phase of menstruation (after ovulation and before menses) colorectal problems intestinal dysfunction  Treating Constipation  There are several ways of treating constipation, including changes to diet and exercise, use of laxatives, adjustments to the pelvic floor, and scheduled toileting.  These treatments include: increasing  fiber and fluids in the diet  increasing physical activity learning muscle coordination  learning proper toileting techniques and toileting modifications  designing and sticking  to a toileting schedule       *sit at toilet with feet on step stool.   [facility] [address] [place], [place] [address] Phone # [phone] Fax [phone]

## 2020-11-10 NOTE — Therapy (Signed)
[facility] [address] [place], [place], [address] Phone: [phone]   Fax:  [phone]  Physical Therapy Evaluation  Patient Details  Name: Christina [name] MRN: [id number] Date of Birth: 09-13-1940 Referring Provider (PT): Vladimir Crofts, [place]   Encounter Date: 11/10/2020   PT End of Session - 11/10/20 1538     Visit Number 1    Date for PT Re-Evaluation 02/09/21    Authorization Type BCBS    PT Start Time 1440    PT Stop Time 6468    PT Time Calculation (min) 50 min    Activity Tolerance Patient tolerated treatment well;No increased pain    Behavior During Therapy [facility] for tasks assessed/performed             Past Medical History:  Diagnosis Date   Atypical chest pain    a. 08/2015 Cath: LM nl, LAD 20ost, D1 small/nl, D2 mod, nl, RI small, nl, LCX nl, OM1 small, nl, OM2 mod nl, RCA 65m, EF 55-65%.   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 CHA2DS2VASc = 3-->Xarelto since 2015;  b. 08/2015 48h holter in setting of palpitations-->PVC's.   Palpitations    a. 08/2015 48h Holter: pvc's.   Papillary carcinoma (HCC)    Post-menopausal    PVCs (premature ventricular contractions)    Shingles    Thyroid cancer (HCC)    s/p THYROIDECTOMY   Whooping cough    as a child    Past Surgical History:  Procedure Laterality Date    tarsal tunnel release  2002   Right/Left  tarsal tunnel release   CARDIAC CATHETERIZATION  04/21/07   GLOBAL ESTIMATED EF IS 60 %; NORMAL LV FUNCTION; NORMAL CORONARY ARTERIES; BORDERLINE INCREASE IN THE AORTIC ROOT    CARDIAC CATHETERIZATION N/A 09/18/2015   Procedure: Left Heart Cath and Coronary Angiography;  Surgeon: Burnell Blanks, MD;  Location: [facility];  Service: Cardiovascular;  Laterality: N/A;   COLONOSCOPY     CYSTOSCOPY  2007   anterior repair of cystocele with mesh   IR ANGIOGRAM EXTREMITY LEFT  11/05/2019   IR RADIOLOGIST EVAL & MGMT  11/01/2019   IR US GUIDE VASC ACCESS LEFT  11/05/2019   MASTECTOMY  2000   LEFT BREAST   MELANOMA EXCISION  1991   REMOVED FROM LEFT LEG   NASAL SINUS SURGERY     THYROIDECTOMY      There were no vitals filed for this visit.    Subjective Assessment - 11/10/20 1447     Subjective Pt reports she has had constipation for the past several months to the point she needs to self mobilize stool removal ~1x per day. Pt reports she feels she needs to have a BM but then unable to evacuate. Pt reports stool type usually 6-7.    Pertinent History Vaginal vault prolapse after hysterectomy, Enterocele, Paroxysmal atrial fibrillation, Hypertension, PAF, Cervical spondylosis without myelopathy    Patient Stated Goals to have  regular bowel movements without self evacuation.    Currently in Pain? No/denies            No emotional/communication barriers or cognitive limitation. Patient is motivated to learn. Patient understands and agrees with treatment goals and plan. PT explains patient will be examined in standing, sitting, and lying down to see how their muscles and joints work. When they are ready, they will be asked to remove their underwear so PT can examine their perineum. The patient is also given the option of providing their own chaperone as one is not provided in our facility. The patient also has the right and is explained the right to defer or refuse any part of the evaluation or treatment including the internal exam. With the patient's consent, PT will use one gloved finger to gently assess the muscles of the pelvic floor, seeing how well it  contracts and relaxes and if there is muscle symmetry. After, the patient will get dressed and PT and patient will discuss exam findings and plan of care. PT and patient discuss plan of care, schedule, attendance policy and HEP activities.     [facility] PT Assessment - 11/10/20 0001       Assessment   Medical Diagnosis K62.89 (ICD-10-CM) - Other specified diseases of anus and rectum  R19.4 (ICD-10-CM) - Change in bowel habit    Referring Provider (PT) Vladimir Crofts, PA-C    Onset Date/Surgical Date --   several minutes   Prior Therapy Not PFPT      Precautions   Precautions Other (comment)   per pt and spouse pt has started being more forgetful.     Restrictions   Weight Bearing Restrictions No   wears Draco shoe on Lt foot for wound healing     Balance Screen   Has the patient fallen in the past 6 months No    Has the patient had a decrease in activity level because of a fear of falling?  No      Home Environment   Living Environment Private residence    Living Arrangements Spouse/significant other    Available Help at Discharge Family    Type of [place] to enter    Entrance Stairs-Number of Steps 3      Prior Function   Level of [place] Retired    Biomedical scientist retired Marine scientist      Cognition   Overall Cognitive Status Within Functional Limits for tasks assessed      Sensation   Light Touch Appears Intact      Coordination   Gross Motor Movements are Fluid and Coordinated Yes    Fine Motor Movements are Fluid and Coordinated Yes      Posture/Postural Control   Posture/Postural Control Postural limitations    Postural Limitations Rounded Shoulders;Increased thoracic kyphosis;Posterior pelvic tilt      ROM / Strength   AROM / PROM / Strength AROM;Strength      AROM   Overall AROM  Deficits    Overall AROM Comments bil hips WFL; thoracic and lumbar spinal mobility limited in all directions by 75% except  flexion and extension by 50%      Strength   Overall Strength Comments bil hips abduction 3+/5, all other directions 4/5. bil knees 4+/5      Flexibility   Soft Tissue Assessment /Muscle Length yes   decreased bil adductors and hamstrings limited by 25%  Palpation   Spinal mobility no tenderness but decreased rib mobility noted with expansion in lateral and posterior directions    Palpation comment noted fascial tightness in abdomen mildly throughout                        Objective measurements completed on examination: See above findings.     Pelvic Floor Special Questions - 11/10/20 0001     Prior Pelvic/Prostate Exam No    Are you Pregnant or attempting pregnancy? No    Prior Pregnancies Yes    Number of Pregnancies 1    Number of Vaginal Deliveries 1    Any difficulty with labor and deliveries No    Episiotomy Performed No    Diastasis Recti no    Currently Sexually Active Yes    Is this Painful No    History of sexually transmitted disease No    Marinoff Scale no problems    Urinary Leakage No    Urinary urgency No    Urinary frequency no    Fecal incontinence No    Fluid intake pt reports she feels she drinks alot of water    Caffeine beverages no    Falling out feeling (prolapse) No    External Perineal Exam WFL    Prolapse None    Pelvic Floor Internal Exam patient identified and patient confirms consent for PT to perform internal soft tissue work and muscle strength and integrity assessment    Exam Type Rectal    Sensation WFL    Palpation no pain/tenderness with exam or surrounding areas    Strength good squeeze, good lift, able to hold agaisnt strong resistance    Strength # of reps 4    Strength # of seconds 5    Tone increased - pt demonstrated poor ability to proper coordinate contrac/relax of EAS with attempting to simulate pushing out stool. multiple attempts made with pt unable to fully relax when needed.                        PT Education - 11/10/20 1537     Education Details Pt educated on exam findings, HEP, abdominal massage, voiding mechanics and breathing mechanics. Husband also updated.    Person(s) Educated Patient    Methods Explanation;Demonstration;Tactile cues;Verbal cues;Handout    Comprehension Verbalized understanding;Returned demonstration              PT Short Term Goals - 11/10/20 1549       PT SHORT TERM GOAL #1   Title pt to be I with HEP    Time 4    Period Weeks    Status New    Target Date 12/08/20      PT SHORT TERM GOAL #2   Title pt to demonstrate proper voiding mechanics and breathing mechanics for decreased strain on pelvic floor and improve technique for bowel movements    Time 4    Period Weeks    Status New    Target Date 12/08/20      PT SHORT TERM GOAL #3   Title pt to demonstrate 4/5 global hip strength for improved stability of pelvis    Time 4    Period Weeks    Status New    Target Date 12/08/20      PT SHORT TERM GOAL #4   Title pt to demonstrate good ability to coordinate pelvic floor contract/relaxation for improved ability to  relax pelvic floor when needed to have bowel movement with less strain at pelvic floor at least 50% of the time    Time 4    Period Weeks    Status New    Target Date 12/08/20               PT Long Term Goals - 11/10/20 1552       PT LONG TERM GOAL #1   Title pt to be I with advanced HEP    Time 3    Period Months    Status New    Target Date 02/09/21      PT LONG TERM GOAL #2   Title pt to demonstrate at least 4+/5 in hips in all directions for improved pelvic stability and lifting mechanics    Time 3    Period Months    Status New    Target Date 02/09/21      PT LONG TERM GOAL #3   Title pt to be I with abdominal massage without cues to improve I with this technique to improve peristalsis.    Time 3    Period Months    Status New    Target Date 02/09/21      PT LONG TERM GOAL  #4   Title pt to demonstrate good ability to coordinate pelvic floor contract/relaxation for improved ability to relax pelvic floor when needed to have bowel movement with less strain at pelvic floor at least 75% of the time    Time 3    Period Months    Status New    Target Date 02/09/21                    Plan - 11/10/20 1539     Clinical Impression Statement Pt is 80yo female presenting to clinic with several month history of constipation with diffculty having a complete bowel movement and pt reports she needs to self evacuate stool from rectum and unable to "push it out even though it feels like it is right there". Pt reports she has no pain and prior to this had a BM every 2-3 days without symptoms of constipation but feels now at least once per day the feeling of needing to have a BM but then unable to evacuate, attempts self evacuation only for a few minutes and if no BM occurs, she stops and does not continue to push. Pt denies any other complaint and reports she does not feel like she is unable to do any activity but feels she has been dealing with this for a long time and it hasn't gotten better and very motiviated to improve this. Pt found to have bil hip weakness, decreased trunk mobility in all directions, increased abdominal fascial restrictions throughout. Pt agreed to internal rectal exam multiple times during session ,educated on reasoning for assessing pelvic floor, female anatomy model shown and explained exam to pt, pt agreed to one gloved finger into rectum for pelvic floor assessment. Pt found to have poor coordination with contract/relax of external anal sphinter when instructed to attempt to push therapist finger out, pt unable and demonstrated full pelvic floor contraction 4/5 strength with this. Multiple attempts made with moderate-max verbal cues to complete however unable to correct this this session. Pt educated on this, breathing mechanics for diaphragmatic  breathing to relax pelvic floor, voiding mechanics with step stool/squatty potty, and pelvic tilts in all directions to improve stool movement, also handout for abdominal massage given.  All also went over with pt's husband with pt's consent. Pt would benefit from continued PT to address deficits found.    Personal Factors and Comorbidities Age;Comorbidity 3+    Comorbidities Vaginal vault prolapse after hysterectomy, Enterocele, Paroxysmal atrial fibrillation, Hypertension, PAF, Cervical spondylosis without myelopathy    Examination-Activity Limitations Toileting    Stability/Clinical Decision Making Evolving/Moderate complexity    Clinical Decision Making Moderate    Rehab Potential Good    PT Frequency 1x / week    PT Duration 8 weeks    PT Treatment/Interventions ADLs/Self Care Home Management;Aquatic Therapy;Functional mobility training;Therapeutic activities;Therapeutic exercise;Neuromuscular re-education;Manual techniques;Patient/family education;Scar mobilization;Passive range of motion;Dry needling;Energy conservation;Taping    PT Next Visit Plan go over all education and practice contract/relaxation of PF    PT Home Exercise Plan Access Code: [email or web address]    Consulted and Agree with Plan of Care Patient;Family member/caregiver             Patient will benefit from skilled therapeutic intervention in order to improve the following deficits and impairments:  Decreased coordination, Increased fascial restricitons, Impaired tone, Decreased endurance, Improper body mechanics, Impaired flexibility, Decreased strength, Postural dysfunction  Visit Diagnosis: Lack of coordination - Plan: PT plan of care cert/re-cert  Abnormal posture - Plan: PT plan of care cert/re-cert  Muscle weakness (generalized) - Plan: PT plan of care cert/re-cert     Problem List Patient Active Problem List   Diagnosis Date Noted   Enterocele 09/26/2019   Vaginal vault prolapse after hysterectomy 09/26/2019    Cervical spondylosis without myelopathy 08/23/2017   Meningioma ([place]) 08/23/2017   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[place]) 12/30/2016   Chronic seasonal allergic rhinitis 05/07/2016   Tinnitus, bilateral 05/07/2016   ETD (Eustachian tube dysfunction), bilateral 02/05/2016   Otorrhagia of left ear 02/05/2016   Atypical chest pain    PAF (paroxysmal atrial fibrillation) (HCC)    Precordial pain    Palpitations 09/16/2015   Paroxysmal atrial fibrillation ([place]) 08/03/2010   Hypertension 08/03/2010   History of breast cancer 08/03/2010   History of thyroid cancer 08/03/2010   History of melanoma 08/03/2010   Tarsal tunnel syndrome 08/03/2010   Stacy Gardner, PT, DPT 09/19/223:58 PM   [facility] [address] [place], [place], [address] Phone: [phone]   Fax:  [phone]  Name: Christina [name] MRN: [id number] Date of Birth: 1940-05-29

## 2020-11-12 ENCOUNTER — Ambulatory Visit
Admission: RE | Admit: 2020-11-12 | Discharge: 2020-11-12 | Disposition: A | Discharge status: Home / Self Care | Payer: Medicare Other | Source: Ambulatory Visit | Attending: Physician Assistant | Admitting: Physician Assistant

## 2020-11-12 DIAGNOSIS — K6289 Other specified diseases of anus and rectum: Secondary | ICD-10-CM

## 2020-11-12 DIAGNOSIS — I700 Atherosclerosis of aorta: Secondary | ICD-10-CM | POA: Diagnosis not present

## 2020-11-12 DIAGNOSIS — R194 Change in bowel habit: Secondary | ICD-10-CM

## 2020-11-12 DIAGNOSIS — K5900 Constipation, unspecified: Secondary | ICD-10-CM | POA: Diagnosis not present

## 2020-11-12 DIAGNOSIS — N281 Cyst of kidney, acquired: Secondary | ICD-10-CM | POA: Diagnosis not present

## 2020-11-12 DIAGNOSIS — K3189 Other diseases of stomach and duodenum: Secondary | ICD-10-CM | POA: Diagnosis not present

## 2020-11-12 MED ORDER — IOPAMIDOL (ISOVUE-370) INJECTION 76%
80.0000 mL | Freq: Once | INTRAVENOUS | Status: AC | PRN
  Administered 2020-11-12: 80 mL via INTRAVENOUS

## 2020-11-13 DIAGNOSIS — Z23 Encounter for immunization: Secondary | ICD-10-CM | POA: Diagnosis not present

## 2020-11-18 DIAGNOSIS — Z85828 Personal history of other malignant neoplasm of skin: Secondary | ICD-10-CM | POA: Diagnosis not present

## 2020-11-18 DIAGNOSIS — Z85820 Personal history of malignant melanoma of skin: Secondary | ICD-10-CM | POA: Diagnosis not present

## 2020-11-18 DIAGNOSIS — C44229 Squamous cell carcinoma of skin of left ear and external auricular canal: Secondary | ICD-10-CM | POA: Diagnosis not present

## 2020-11-19 ENCOUNTER — Ambulatory Visit: Payer: Medicare Other | Admitting: Physical Therapy

## 2020-11-19 ENCOUNTER — Telehealth: Payer: Self-pay | Admitting: Urology

## 2020-11-19 NOTE — Telephone Encounter (Signed)
DOS - 12/15/20  METATARSAL OSTEOTOMY 5TH LEFT --- 33744   BCBS EFFECTIVE DATE - 02/23/20   PLAN DEDUCTIBLE - $0.00 OUT OF POCKET - $3,900.00 W/ $1,136.78 REMAINING COINSURANCE - $3,900.00 W/ $1,136.78 REMAINING COPAY - $200.00   NO PRIOR AUTH REQUIRED

## 2020-11-28 ENCOUNTER — Encounter: Payer: Medicare Other | Admitting: Physical Therapy

## 2020-12-02 ENCOUNTER — Encounter: Payer: Medicare Other | Admitting: Physical Therapy

## 2020-12-03 DIAGNOSIS — M25551 Pain in right hip: Secondary | ICD-10-CM | POA: Diagnosis not present

## 2020-12-08 ENCOUNTER — Ambulatory Visit: Payer: Medicare Other | Attending: Internal Medicine | Admitting: Physical Therapy

## 2020-12-08 ENCOUNTER — Other Ambulatory Visit: Payer: Self-pay

## 2020-12-08 DIAGNOSIS — M6281 Muscle weakness (generalized): Secondary | ICD-10-CM | POA: Insufficient documentation

## 2020-12-08 DIAGNOSIS — R279 Unspecified lack of coordination: Secondary | ICD-10-CM | POA: Diagnosis not present

## 2020-12-08 NOTE — Therapy (Addendum)
[facility] @ [place], [place], [address] Phone: [phone]   Fax:  [phone]  Physical Therapy Treatment  Patient Details  Name: Christina [name] MRN: [id number] Date of Birth: Jul 17, 1940 Referring Provider (PT): Vladimir Crofts, [place]   Encounter Date: 12/08/2020   PT End of Session - 12/08/20 1136     Visit Number 2    Date for PT Re-Evaluation 02/09/21    Authorization Type BCBS    PT Start Time 1100    PT Stop Time 1137    PT Time Calculation (min) 37 min    Activity Tolerance Patient tolerated treatment well;No increased pain    Behavior During Therapy [facility] for tasks assessed/performed             Past Medical History:  Diagnosis Date   Atypical chest pain    a. 08/2015 Cath: LM nl, LAD 20ost, D1 small/nl, D2 mod, nl, RI small, nl, LCX nl, OM1 small, nl, OM2 mod nl, RCA 104m EF 55-65%.   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 CHA2DS2VASc = 3-->Xarelto since 2015;  b. 08/2015 48h holter in setting of palpitations-->PVC's.   Palpitations    a. 08/2015 48h Holter: pvc's.   Papillary carcinoma (HCC)    Post-menopausal    PVCs (premature ventricular contractions)    Shingles    Thyroid cancer (HCC)    s/p THYROIDECTOMY   Whooping cough    as a child    Past Surgical History:  Procedure Laterality Date    tarsal tunnel release  2002   Right/Left  tarsal tunnel release   CARDIAC CATHETERIZATION  04/21/07   GLOBAL ESTIMATED EF IS 60 %; NORMAL LV FUNCTION; NORMAL CORONARY ARTERIES; BORDERLINE INCREASE IN THE AORTIC ROOT    CARDIAC CATHETERIZATION N/A 09/18/2015   Procedure: Left Heart Cath and Coronary Angiography;  Surgeon: CBurnell Blanks MD;  [facility];  Service: Cardiovascular;  Laterality: N/A;   COLONOSCOPY     CYSTOSCOPY  2007   anterior repair of cystocele with mesh   IR ANGIOGRAM EXTREMITY LEFT  11/05/2019   IR RADIOLOGIST EVAL & MGMT  11/01/2019   IR [place] VASC ACCESS LEFT  11/05/2019   MASTECTOMY  2000   LEFT BREAST   MELANOMA EXCISION  1991   REMOVED FROM LEFT LEG   NASAL SINUS SURGERY     THYROIDECTOMY      There were no vitals filed for this visit.   Subjective Assessment - 12/08/20 1102     Subjective Pt reports improvement with consitipation esp. last 4-5 days, type 3-4 Bristol scale. Pt reports she has not needed to do self evacuation and now feels shes able to fully empty.    Pertinent History Vaginal vault prolapse after hysterectomy, Enterocele, Paroxysmal atrial fibrillation, Hypertension, PAF, Cervical spondylosis without myelopathy    Currently in Pain? No/denies  [facility] Adult PT Treatment/Exercise - 12/08/20 0001       Self-Care   Self-Care Other Self-Care Comments    Other Self-Care Comments  Pt educated on breathing and voiding mechanics, fiber types, how to complete abdominal massage at home      Manual Therapy   Manual Therapy Soft tissue mobilization    Manual therapy comments abdominal massage completed in supine with CW/CCW, ILU, and M methods completed x5 each                     PT Education - 12/08/20 1136     Education Details Pt educated on continuing abdominal massage at home and breathing mechanics and fiber types    Person(s) Educated Patient    Methods Explanation;Demonstration;Tactile cues;Verbal cues;Handout    Comprehension Verbalized understanding;Returned demonstration              PT Short Term Goals - 11/10/20 1549       PT SHORT TERM GOAL #1    Title pt to be I with HEP    Time 4    Period Weeks    Status New    Target Date 12/08/20      PT SHORT TERM GOAL #2   Title pt to demonstrate proper voiding mechanics and breathing mechanics for decreased strain on pelvic floor and improve technique for bowel movements    Time 4    Period Weeks    Status New    Target Date 12/08/20      PT SHORT TERM GOAL #3   Title pt to demonstrate 4/5 global hip strength for improved stability of pelvis    Time 4    Period Weeks    Status New    Target Date 12/08/20      PT SHORT TERM GOAL #4   Title pt to demonstrate good ability to coordinate pelvic floor contract/relaxation for improved ability to relax pelvic floor when needed to have bowel movement with less strain at pelvic floor at least 50% of the time    Time 4    Period Weeks    Status New    Target Date 12/08/20               PT Long Term Goals - 11/10/20 1552       PT LONG TERM GOAL #1   Title pt to be I with advanced HEP    Time 3    Period Months    Status New    Target Date 02/09/21      PT LONG TERM GOAL #2   Title pt to demonstrate at least 4+/5 in hips in all directions for improved pelvic stability and lifting mechanics    Time 3    Period Months    Status New    Target Date 02/09/21      PT LONG TERM GOAL #3   Title pt to be I with abdominal massage without cues to improve I with this technique to improve peristalsis.    Time 3    Period Months    Status New    Target Date 02/09/21      PT LONG TERM GOAL #4   Title pt to demonstrate good ability to coordinate pelvic floor contract/relaxation for improved ability to relax pelvic floor when needed to have bowel movement with less strain at pelvic floor at least 75% of the time    Time 3    Period Months  Status New    Target Date 02/09/21                   Plan - 12/08/20 1137     Clinical Impression Statement Pt presents to clininc reporting total improvement of symptoms in the  last 4-5 days. Pt reports no longer needing to self mobilize stool or remove stool from rectum with BMs, now feels she is fully emptying and also no longer feels that she has to have a BM but then unable to evacuate. Now, pt is able to fully evacuate, no straining, and improved from type 6-7 to typr 3-4 on Bristol stool scale and now "back to normal". Pt reports she had been doing abdominal massage but wanted to complete this todaye to insure she was doing it correctly. This was completed and pt educated during on technique. Pt also educated on fiber types for bulking stool if stools are loose and softening stools if too hard. Handout given and pt denied questions. Pt would benefit from continued PT to address deficits found.    Personal Factors and Comorbidities Age;Comorbidity 3+    Comorbidities Vaginal vault prolapse after hysterectomy, Enterocele, Paroxysmal atrial fibrillation, Hypertension, PAF, Cervical spondylosis without myelopathy    Examination-Activity Limitations Toileting    Stability/Clinical Decision Making Evolving/Moderate complexity    Clinical Decision Making Moderate    Rehab Potential Good    PT Frequency 1x / week    PT Duration 8 weeks    PT Treatment/Interventions ADLs/Self Care Home Management;Aquatic Therapy;Functional mobility training;Therapeutic activities;Therapeutic exercise;Neuromuscular re-education;Manual techniques;Patient/family education;Scar mobilization;Passive range of motion;Dry needling;Energy conservation;Taping    PT Next Visit Plan practice contract/relaxation of PF    PT Home Exercise Plan Access Code: [email or web address]    Consulted and Agree with Plan of Care Patient             Patient will benefit from skilled therapeutic intervention in order to improve the following deficits and impairments:  Decreased coordination, Increased fascial restricitons, Impaired tone, Decreased endurance, Improper body mechanics, Impaired flexibility, Decreased strength,  Postural dysfunction  Visit Diagnosis: Lack of coordination  Muscle weakness (generalized)     Problem List Patient Active Problem List   Diagnosis Date Noted   Enterocele 09/26/2019   Vaginal vault prolapse after hysterectomy 09/26/2019   Cervical spondylosis without myelopathy 08/23/2017   Meningioma ([place]) 08/23/2017   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[place]) 12/30/2016   Chronic seasonal allergic rhinitis 05/07/2016   Tinnitus, bilateral 05/07/2016   ETD (Eustachian tube dysfunction), bilateral 02/05/2016   Otorrhagia of left ear 02/05/2016   Atypical chest pain    PAF (paroxysmal atrial fibrillation) (HCC)    Precordial pain    Palpitations 09/16/2015   Paroxysmal atrial fibrillation ([place]) 08/03/2010   Hypertension 08/03/2010   History of breast cancer 08/03/2010   History of thyroid cancer 08/03/2010   History of melanoma 08/03/2010   Tarsal tunnel syndrome 08/03/2010   Stacy Gardner, PT, DPT 12/08/2209:42 AM    PHYSICAL THERAPY DISCHARGE SUMMARY  Visits from Start of Care: 2  Current functional level related to goals / functional outcomes: Pt called clinic to cancel appointments reporting she no longer needed PT as symptoms resolved.   Remaining deficits: Unable to formally reassess as pt has not returned but called to be DC'd   Education / Equipment: HEP   Patient agrees to discharge. Patient goals were partially met. Patient is being discharged due to being pleased with the current functional level. Thank you for the referral.   .  [facility] @ [place], [place], [address] Phone: [phone]   Fax:  [phone]  Name: Christina [name] MRN: [id number] Date of Birth: 08/23/1940

## 2020-12-08 NOTE — Patient Instructions (Signed)
About Constipation  Constipation Overview Constipation is the most common gastrointestinal complaint -- about 4 million Americans experience constipation and make 2.5 million physician visits a year to get help for the problem.  Constipation can occur when the colon absorbs too much water, the colon's muscle contraction is slow or sluggish, and/or there is delayed transit time through the colon.  The result is stool that is hard and dry.  Indicators of constipation include straining during bowel movements greater than 25% of the time, having fewer than three bowel movements per week, and/or the feeling of incomplete evacuation.  There are established guidelines ([place] ) for defining constipation. A person needs to have two or more of the following symptoms for at least 12 weeks (not necessarily consecutive) in the preceding 12 months: Straining in  greater than 25% of bowel movements Lumpy or hard stools in greater than 25% of bowel movements Sensation of incomplete emptying in greater than 25% of bowel movements Sensation of anorectal obstruction/blockade in greater than 25% of bowel movements Manual maneuvers to help empty greater than 25% of bowel movements (e.g., digital evacuation, support of the pelvic floor)  Less than  3 bowel movements/week Loose stools are not present, and criteria for irritable bowel syndrome are insufficient Common Causes of Constipation lack of fiber in your diet lack of physical activity medications, including iron and calcium supplements  dairy intake dehydration abuse of laxatives travel Irritable Bowel Syndrome pregnancy luteal phase of menstruation (after ovulation and before menses) colorectal problems intestinal dysfunction

## 2020-12-13 ENCOUNTER — Other Ambulatory Visit: Payer: Self-pay | Admitting: Cardiovascular Disease

## 2020-12-15 ENCOUNTER — Other Ambulatory Visit: Payer: Self-pay | Admitting: Podiatry

## 2020-12-15 ENCOUNTER — Encounter: Payer: Self-pay | Admitting: Podiatry

## 2020-12-15 DIAGNOSIS — M21542 Acquired clubfoot, left foot: Secondary | ICD-10-CM | POA: Diagnosis not present

## 2020-12-15 DIAGNOSIS — M205X2 Other deformities of toe(s) (acquired), left foot: Secondary | ICD-10-CM | POA: Diagnosis not present

## 2020-12-15 MED ORDER — HYDROCODONE-ACETAMINOPHEN 5-325 MG PO TABS
1.0000 | ORAL_TABLET | Freq: Four times a day (QID) | ORAL | 0 refills | Status: DC | PRN
Start: 2020-12-15 — End: 2023-01-18

## 2020-12-15 MED ORDER — IBUPROFEN 800 MG PO TABS: 800.0000 mg | ORAL_TABLET | Freq: Four times a day (QID) | ORAL | 1 refills | Status: AC | PRN

## 2020-12-15 NOTE — Telephone Encounter (Signed)
Eliquis 5 mg refill request received. Patient is 80 years old, weight-72.4 kg, Crea- 1.1 on 11/05/19, Diagnosis-PAF, and last seen by Dr. Angelena Form on 12/17/19. Pt is overdue for labs and appt to see provider. Will send in 30 day supply to her pharmacy and route to scheduling.

## 2020-12-16 DIAGNOSIS — H353211 Exudative age-related macular degeneration, right eye, with active choroidal neovascularization: Secondary | ICD-10-CM | POA: Diagnosis not present

## 2020-12-17 ENCOUNTER — Encounter: Payer: Medicare Other | Admitting: Physical Therapy

## 2020-12-23 ENCOUNTER — Encounter: Payer: Medicare Other | Admitting: Physical Therapy

## 2020-12-24 ENCOUNTER — Ambulatory Visit (INDEPENDENT_AMBULATORY_CARE_PROVIDER_SITE_OTHER): Payer: Medicare Other | Admitting: Podiatry

## 2020-12-24 ENCOUNTER — Other Ambulatory Visit: Payer: Self-pay

## 2020-12-24 ENCOUNTER — Ambulatory Visit (INDEPENDENT_AMBULATORY_CARE_PROVIDER_SITE_OTHER): Payer: Medicare Other

## 2020-12-24 DIAGNOSIS — Z9889 Other specified postprocedural states: Secondary | ICD-10-CM

## 2020-12-24 DIAGNOSIS — M216X2 Other acquired deformities of left foot: Secondary | ICD-10-CM

## 2020-12-24 NOTE — Progress Notes (Signed)
Subjective:  Patient ID: Christina [name], female    DOB: 26-Mar-1940,  MRN: [id number]  Chief Complaint  Patient presents with   Routine Post Op    POV #1 DOS 12/15/2020 LT 5TH METATARSAL FLOATING OSTEOTOMY W/O FIXATION    DOS: 12/15/2020 Procedure: Left fifth metatarsal floating osteotomy  80 y.o. female returns for post-op check.  Patient states she is doing well.  No pain.  She is ready to get out of the boot.  She is ready have her stitches taken out.  She bandages clean dry and intact  Review of Systems: Negative except as noted in the HPI. Denies N/V/F/Ch.  Past Medical History:  Diagnosis Date   Atypical chest pain    a. 08/2015 Cath: LM nl, LAD 20ost, D1 small/nl, D2 mod, nl, RI small, nl, LCX nl, OM1 small, nl, OM2 mod nl, RCA 38m, EF 55-65%.   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 CHA2DS2VASc = 3-->Xarelto since 2015;  b. 08/2015 48h holter in setting of palpitations-->PVC's.   Palpitations    a. 08/2015 48h Holter: pvc's.   Papillary carcinoma (HCC)    Post-menopausal    PVCs (premature ventricular contractions)    Shingles    Thyroid cancer (HCC)    s/p THYROIDECTOMY   Whooping cough    as a child    Current Outpatient Medications:    acetaminophen (TYLENOL) 500 MG tablet, Take 500-1,000 mg by mouth daily as needed for mild pain or headache., Disp: , Rfl:    apixaban (ELIQUIS) 5 MG TABS tablet, Take 1 tablet (5 mg total) by mouth 2 (two) times daily. NEEDS APPT FOR FURTHER REFILLS., Disp: 60 tablet, Rfl: 0   calcium citrate-vitamin D (CITRACAL+D) 315-200 MG-UNIT tablet, Take 2 tablets by mouth 2  (two) times daily., Disp: , Rfl:    diltiazem (CARDIZEM CD) 120 MG 24 hr capsule, TAKE ONE CAPSULE BY MOUTH DAILY, Disp: 90 capsule, Rfl: 1   ezetimibe (ZETIA) 10 MG tablet, Take 10 mg by mouth daily., Disp: , Rfl:    galantamine (RAZADYNE ER) 24 MG 24 hr capsule, Take 24 mg by mouth daily with breakfast., Disp: , Rfl:    Glucosamine-Chondroitin (OSTEO BI-FLEX REGULAR STRENGTH PO), Take 2 tablets by mouth daily after supper., Disp: , Rfl:    HYDROcodone-acetaminophen (NORCO) 5-325 MG tablet, Take 1 tablet by mouth every 6 (six) hours as needed for moderate pain., Disp: 30 tablet, Rfl: 0   ibuprofen (ADVIL) 800 MG tablet, Take 1 tablet (800 mg total) by mouth every 6 (six) hours as needed., Disp: 60 tablet, Rfl: 1   levothyroxine (SYNTHROID, LEVOTHROID) 150 MCG tablet, Take 150 mcg by mouth See admin instructions. Take 150 mcg before breakfast Monday through Saturday. Skip dose on Sundays, Disp: , Rfl:    Misc Natural Products (OSTEO BI-FLEX ADV TRIPLE ST) TABS, Take 2 tablets by mouth at bedtime., Disp: , Rfl:    Multiple Vitamins-Minerals (PRESERVISION AREDS) CAPS, Take 1 capsule by mouth 2 (two) times daily. ,  Disp: , Rfl:    nebivolol (BYSTOLIC) 5 MG tablet, Take 5 mg by mouth at bedtime. , Disp: , Rfl:    Omega-3 Fatty Acids (FISH OIL) 1200 MG CAPS, Take 2,400 mg by mouth daily., Disp: , Rfl:    omeprazole (PRILOSEC) 20 MG capsule, Take 20 mg by mouth daily., Disp: , Rfl:    oxyCODONE-acetaminophen (PERCOCET) 10-325 MG tablet, Take 1 tablet by mouth every 4 (four) hours as needed for pain., Disp: 30 tablet, Rfl: 0   potassium chloride SA (K-DUR,KLOR-CON) 20 MEQ tablet, Take 40 mEq by mouth daily. , Disp: , Rfl:    simvastatin (ZOCOR) 20 MG tablet, Take 20 mg by mouth at bedtime., Disp: , Rfl:    triamterene-hydrochlorothiazide (MAXZIDE-25) 37.5-25 MG tablet, Take 1 tablet by mouth daily., Disp: , Rfl:   Social History   Tobacco Use  Smoking Status Never  Smokeless Tobacco Never     Allergies  Allergen Reactions   Carbamazepine Hives and Rash   Oxaprozin Hives and Rash   Amoxicillin-Pot Clavulanate Diarrhea and Nausea Only   Oxycodone-Acetaminophen     Other reaction(s): Chills (intolerance)   Prednisolone     Unknown reaction   Prednisone Itching and Other (See Comments)    leg swelling /dyuria    Objective:  There were no vitals filed for this visit. There is no height or weight on file to calculate BMI. Constitutional Well developed. Well nourished.  Vascular Foot warm and well perfused. Capillary refill normal to all digits.   Neurologic Normal speech. Oriented to person, place, and time. Epicritic sensation to light touch grossly present bilaterally.  Dermatologic Skin healing well without signs of infection. Skin edges well coapted without signs of infection.  Orthopedic: Tenderness to palpation noted about the surgical site.   Radiographs: 3 views of skeletally mature adult left foot: Good correction alignment noted.  Good osteotomy noted. Assessment:   1. Status post foot surgery   2. 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  S/p foot surgery left -Progressing as expected post-operatively. -XR: See above -WB Status: Weightbearing as tolerated in surgical shoe -Sutures: Intact.  No clinical signs of dehiscence noted no complication noted. -Medications: None -Foot redressed.  No follow-ups on file.

## 2020-12-31 ENCOUNTER — Encounter: Payer: Medicare Other | Admitting: Physical Therapy

## 2021-01-01 DIAGNOSIS — J101 Influenza due to other identified influenza virus with other respiratory manifestations: Secondary | ICD-10-CM | POA: Diagnosis not present

## 2021-01-01 DIAGNOSIS — R519 Headache, unspecified: Secondary | ICD-10-CM | POA: Diagnosis not present

## 2021-01-01 DIAGNOSIS — J029 Acute pharyngitis, unspecified: Secondary | ICD-10-CM | POA: Diagnosis not present

## 2021-01-01 DIAGNOSIS — R5383 Other fatigue: Secondary | ICD-10-CM | POA: Diagnosis not present

## 2021-01-05 ENCOUNTER — Encounter: Payer: Medicare Other | Admitting: Physical Therapy

## 2021-01-07 ENCOUNTER — Other Ambulatory Visit: Payer: Self-pay

## 2021-01-07 ENCOUNTER — Ambulatory Visit (INDEPENDENT_AMBULATORY_CARE_PROVIDER_SITE_OTHER): Payer: Medicare Other | Admitting: Podiatry

## 2021-01-07 DIAGNOSIS — M216X2 Other acquired deformities of left foot: Secondary | ICD-10-CM

## 2021-01-07 DIAGNOSIS — Z9889 Other specified postprocedural states: Secondary | ICD-10-CM

## 2021-01-09 NOTE — Progress Notes (Signed)
Subjective:  Patient ID: Christina [name], female    DOB: [birth year],  MRN: [id number]  Chief Complaint  Patient presents with   Routine Post Op     POV #2 DOS 12/15/2020 LT 5TH METATARSAL FLOATING OSTEOTOMY W/O FIXATION    DOS: 12/15/2020 Procedure: Left fifth metatarsal floating osteotomy  80 y.o. female returns for post-op check.  Patient states she is doing well.  No pain.  She is doing a lot better.  She denies any other acute complaints no pain.  Review of Systems: Negative except as noted in the HPI. Denies N/V/F/Ch.  Past Medical History:  Diagnosis Date   Atypical chest pain    a. 08/2015 Cath: LM nl, LAD 20ost, D1 small/nl, D2 mod, nl, RI small, nl, LCX nl, OM1 small, nl, OM2 mod nl, RCA 65m, EF 55-65%.   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 CHA2DS2VASc = 3-->Xarelto since 2015;  b. 08/2015 48h holter in setting of palpitations-->PVC's.   Palpitations    a. 08/2015 48h Holter: pvc's.   Papillary carcinoma (HCC)    Post-menopausal    PVCs (premature ventricular contractions)    Shingles    Thyroid cancer (HCC)    s/p THYROIDECTOMY   Whooping cough    as a child    Current Outpatient Medications:    acetaminophen (TYLENOL) 500 MG tablet, Take 500-1,000 mg by mouth daily as needed for mild pain or headache., Disp: , Rfl:    apixaban (ELIQUIS) 5 MG TABS tablet, Take 1 tablet (5 mg total) by mouth 2 (two) times daily. NEEDS APPT FOR FURTHER REFILLS., Disp: 60 tablet, Rfl: 0   calcium citrate-vitamin D (CITRACAL+D) 315-200 MG-UNIT tablet, Take 2 tablets by mouth 2 (two) times daily., Disp: , Rfl:     diltiazem (CARDIZEM CD) 120 MG 24 hr capsule, TAKE ONE CAPSULE BY MOUTH DAILY, Disp: 90 capsule, Rfl: 1   ezetimibe (ZETIA) 10 MG tablet, Take 10 mg by mouth daily., Disp: , Rfl:    galantamine (RAZADYNE ER) 24 MG 24 hr capsule, Take 24 mg by mouth daily with breakfast., Disp: , Rfl:    Glucosamine-Chondroitin (OSTEO BI-FLEX REGULAR STRENGTH PO), Take 2 tablets by mouth daily after supper., Disp: , Rfl:    HYDROcodone-acetaminophen (NORCO) 5-325 MG tablet, Take 1 tablet by mouth every 6 (six) hours as needed for moderate pain., Disp: 30 tablet, Rfl: 0   ibuprofen (ADVIL) 800 MG tablet, Take 1 tablet (800 mg total) by mouth every 6 (six) hours as needed., Disp: 60 tablet, Rfl: 1   levothyroxine (SYNTHROID, LEVOTHROID) 150 MCG tablet, Take 150 mcg by mouth See admin instructions. Take 150 mcg before breakfast Monday through Saturday. Skip dose on Sundays, Disp: , Rfl:    Misc Natural Products (OSTEO BI-FLEX ADV TRIPLE ST) TABS, Take 2 tablets by mouth at bedtime., Disp: , Rfl:    Multiple Vitamins-Minerals (PRESERVISION AREDS) CAPS, Take 1 capsule by mouth 2 (two) times daily. , Disp: , Rfl:    nebivolol (BYSTOLIC) 5  MG tablet, Take 5 mg by mouth at bedtime. , Disp: , Rfl:    Omega-3 Fatty Acids (FISH OIL) 1200 MG CAPS, Take 2,400 mg by mouth daily., Disp: , Rfl:    omeprazole (PRILOSEC) 20 MG capsule, Take 20 mg by mouth daily., Disp: , Rfl:    oxyCODONE-acetaminophen (PERCOCET) 10-325 MG tablet, Take 1 tablet by mouth every 4 (four) hours as needed for pain., Disp: 30 tablet, Rfl: 0   potassium chloride SA (K-DUR,KLOR-CON) 20 MEQ tablet, Take 40 mEq by mouth daily. , Disp: , Rfl:    simvastatin (ZOCOR) 20 MG tablet, Take 20 mg by mouth at bedtime., Disp: , Rfl:    triamterene-hydrochlorothiazide (MAXZIDE-25) 37.5-25 MG tablet, Take 1 tablet by mouth daily., Disp: , Rfl:   Social History   Tobacco Use  Smoking Status Never  Smokeless Tobacco Never    Allergies  Allergen Reactions    Carbamazepine Hives and Rash   Oxaprozin Hives and Rash   Amoxicillin-Pot Clavulanate Diarrhea and Nausea Only   Oxycodone-Acetaminophen     Other reaction(s): Chills (intolerance)   Prednisolone     Unknown reaction   Prednisone Itching and Other (See Comments)    leg swelling /dyuria    Objective:  There were no vitals filed for this visit. There is no height or weight on file to calculate BMI. Constitutional Well developed. Well nourished.  Vascular Foot warm and well perfused. Capillary refill normal to all digits.   Neurologic Normal speech. Oriented to person, place, and time. Epicritic sensation to light touch grossly present bilaterally.  Dermatologic Skin completely epithelialized.  No clinical signs of dehiscence noted no infection noted.  Orthopedic: No tenderness to palpation noted about the surgical site.   Radiographs: 3 views of skeletally mature adult left foot: Good correction alignment noted.  Good osteotomy noted. Assessment:   No diagnosis found.  Plan:  Patient was evaluated and treated and all questions answered.  S/p foot surgery left -Clinically healed and no further hyperkeratotic lesion/pressure sore noted to the left plantar foot.  At this time I discussed with the patient if any foot and ankle issues arise in future I have asked her to come see me.  She states understanding.  Patient is officially discharged from my care  No follow-ups on file.

## 2021-01-12 ENCOUNTER — Other Ambulatory Visit: Payer: Self-pay | Admitting: Cardiovascular Disease

## 2021-01-12 NOTE — Telephone Encounter (Signed)
Eliquis 5 mg refill request received. Patient is 80 years old, weight-72.4 kg, Crea-1.13 on 11/04/20 via [facility], Diagnosis-PAF, and last seen by Dr. Angelena Form on 12/17/19, has appt w/ Richardson Dopp, PA on 01/13/21. Dose is appropriate based on dosing criteria. Will send in refill to requested pharmacy.

## 2021-01-13 ENCOUNTER — Other Ambulatory Visit: Payer: Self-pay

## 2021-01-13 ENCOUNTER — Ambulatory Visit: Payer: Medicare Other | Admitting: Physician Assistant

## 2021-01-13 ENCOUNTER — Encounter: Payer: Self-pay | Admitting: Physician Assistant

## 2021-01-13 VITALS — BP 120/70 | HR 56 | Ht 70.0 in | Wt 158.4 lb

## 2021-01-13 DIAGNOSIS — I5022 Chronic systolic (congestive) heart failure: Secondary | ICD-10-CM

## 2021-01-13 DIAGNOSIS — I480 Paroxysmal atrial fibrillation: Secondary | ICD-10-CM | POA: Diagnosis not present

## 2021-01-13 DIAGNOSIS — I2510 Atherosclerotic heart disease of native coronary artery without angina pectoris: Secondary | ICD-10-CM

## 2021-01-13 DIAGNOSIS — E785 Hyperlipidemia, unspecified: Secondary | ICD-10-CM

## 2021-01-13 MED ORDER — ROSUVASTATIN CALCIUM 20 MG PO TABS: 20.0000 mg | ORAL_TABLET | Freq: Every day | ORAL | 2 refills | Status: DC

## 2021-01-13 NOTE — Progress Notes (Signed)
Office Visit    Patient Name: Langley Flatley Date of Encounter: 01/13/2021  PCP:  Crist Infante, [place]  Cardiologist:  Lauree Chandler, MD  Advanced Practice Provider:  No care team member to display Electrophysiologist:  None    Chief Complaint    Christina [name] is a 80 y.o. female with a hx of paroxysmal atrial fibrillation, SVT, PVCs, HTN, breast cancer, and mild CAD presents today for her annual follow-up appointment.    Past Medical History    Past Medical History:  Diagnosis Date   Atypical chest pain    a. 08/2015 Cath: LM nl, LAD 20ost, D1 small/nl, D2 mod, nl, RI small, nl, LCX nl, OM1 small, nl, OM2 mod nl, RCA 71m, EF 55-65%.   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 CHA2DS2VASc = 3-->Xarelto since 2015;  b. 08/2015 48h holter in setting of palpitations-->PVC's.   Palpitations    a. 08/2015 48h Holter: pvc's.   Papillary carcinoma (HCC)    Post-menopausal    PVCs (premature ventricular contractions)    Shingles    Thyroid cancer (HCC)    s/p THYROIDECTOMY   Whooping cough    as a child   Past Surgical History:  Procedure Laterality Date    tarsal tunnel release  2002   Right/Left  tarsal tunnel release   CARDIAC CATHETERIZATION  04/21/07   GLOBAL ESTIMATED EF IS 60 %; NORMAL LV FUNCTION; NORMAL CORONARY ARTERIES; BORDERLINE INCREASE IN THE AORTIC ROOT   CARDIAC CATHETERIZATION N/A 09/18/2015   Procedure: Left Heart Cath and Coronary Angiography;  Surgeon: Burnell Blanks, MD;  Location: [facility];  Service: Cardiovascular;   Laterality: N/A;   COLONOSCOPY     CYSTOSCOPY  2007   anterior repair of cystocele with mesh   IR ANGIOGRAM EXTREMITY LEFT  11/05/2019   IR RADIOLOGIST EVAL & MGMT  11/01/2019   IR US GUIDE VASC ACCESS LEFT  11/05/2019   MASTECTOMY  2000   LEFT BREAST   MELANOMA EXCISION  1991   REMOVED FROM LEFT LEG   NASAL SINUS SURGERY     THYROIDECTOMY      Allergies  Allergies  Allergen Reactions   Carbamazepine Hives, Rash and Other (See Comments)   Oxaprozin Hives, Rash and Other (See Comments)   Amoxicillin-Pot Clavulanate Diarrhea and Nausea Only   Ciprofibrate Other (See Comments)   Miralax [Polyethylene Glycol] Other (See Comments)   Oxycodone-Acetaminophen     Other reaction(s): Chills (intolerance)   Pedi-Pre Tape Spray [Wound Dressing Adhesive] Other (See Comments)   Prednisolone     Unknown reaction   Ramipril Other (See Comments)   Prednisone Itching and Other (See Comments)    leg swelling /dyuria     History of Present Illness    Christina [name] is a 80 y.o. female with a hx of hx of paroxysmal atrial fibrillation, SVT, PVCs, HTN, breast cancer, and mild CAD presents today for her annual  follow-up appointment last seen 12/17/2019 by Dr. Angelena Form.  She was having some atrial fibrillation and she was started on Xarelto back in 2015. She was seen in the ED 9/16 with palpitations, nausea and SOB and was found to be in atrial fibrillation with RVR. She converted to sinus in the ED spontaneously. She reported more frequent episodes of palpitations in July 2017 with associated weakness and chest pain. 48 hour monitor July 2017 with PVCs, PACs, and several short runs of SVT. She is now on Eliquis. Cardiac cath 09/18/15 with mild CAD (10% mid RCA stenosis, 20% proximal LAD stenosis). Normal LV systolic function. Most recent echo August 2017 with normal LV size and function, no valve disease. Cardizem was added and she has tolerated. She has had a left foot ulcer. Normal ABI August  2021.   At her last appointment she denied chest pain, dyspnea, palpitations, lower extremity edema, orthopnea, PND, dizziness, near syncope or syncope. Her BP was well controlled. LDL was 66 on statin and zetia. Her weight was stable and she was euvolemic on exam. There were no medication changes indicated at this time.     Today, she has been feeling pretty good over the past year.  Dr. Haynes Kerns her primary care stopped her Zetia due to some dizziness.  She is still taking her simvastatin.  However, her last LDL was in June 2022 which was 83.  Due to her history recommend an LDL of less than 70.  Otherwise, she denies shortness of breath, chest pain, palpitations, and lower leg swelling.  She is slightly bradycardic today with heart rate of 56 however she is asymptomatic.  Her blood pressure is well controlled today in the clinic and was 120/70.  Her blood pressure is well controlled at home.  She does have history of atrial fibrillation but has been well controlled for years on Bystolic and Cardizem.  She remains on Eliquis without any significant bleeding.  Reports no shortness of breath nor dyspnea on exertion. Reports no chest pain, pressure, or tightness. No edema, orthopnea, PND. Reports no palpitations.     EKGs/Labs/Other Studies Reviewed:   The following studies were reviewed today:  Echocardiogram 10/09/2015  Study Conclusions   - Left ventricle: The cavity size was normal. Wall thickness was    normal. Systolic function was normal. The estimated ejection    fraction was in the range of 55% to 60%. Wall motion was normal;    there were no regional wall motion abnormalities. Left    ventricular diastolic function parameters were normal.  - Atrial septum: No defect or patent foramen ovale was identified.  - Impressions: NOrmal GLS -19.5. Normal GLS -19.5.   Impressions:   - NOrmal GLS -19.5. Normal GLS -19.5.   -------------------------------------------------------------------   Study data:  The previous study was not available, so comparison  was made to the report of 08/20/2010.  Study status:  Routine.  Procedure:  The patient reported no pain pre or post test.  Transthoracic echocardiography. Image quality was adequate.   Transthoracic echocardiography.  M-mode, complete 2D, spectral  Doppler, and color Doppler.  Birthdate:  Patient birthdate:  August 07, 1940.  Age:  Patient is 80 yr old.  Sex:  Gender: female.  BMI: 22.6 kg/m^2.  Blood pressure:     137/88  Patient status:  Outpatient.  Study date:  Study date: 10/09/2015. Study time: 07:48  AM.  Location:  [facility]   -------------------------------------------------------------------   -------------------------------------------------------------------  Left ventricle:  The cavity  size was normal. Wall thickness was  normal. Systolic function was normal. The estimated ejection  fraction was in the range of 55% to 60%. Wall motion was normal;  there were no regional wall motion abnormalities. The transmitral  flow pattern was normal. The deceleration time of the early  transmitral flow velocity was normal. The pulmonary vein flow  pattern was normal. The tissue Doppler parameters were normal. Left  ventricular diastolic function parameters were normal.   -------------------------------------------------------------------  Aortic valve:   Structurally normal valve. Trileaflet; normal  thickness leaflets. Cusp separation was normal. Mobility was not  restricted.  Doppler:  Transvalvular velocity was within the normal  range. There was no stenosis. There was no regurgitation.   -------------------------------------------------------------------  Aorta:  The aorta was normal, not dilated, and non-diseased. Aortic  root: The aortic root was normal in size.   -------------------------------------------------------------------  Mitral valve:   Structurally normal valve.   Leaflet separation was   normal. Mobility was not restricted.  Doppler:  Transvalvular  velocity was within the normal range. There was no evidence for  stenosis. There was no significant regurgitation.    Peak gradient  (D): 4 mm Hg.   -------------------------------------------------------------------  Left atrium:  The atrium was normal in size.   -------------------------------------------------------------------  Atrial septum:  No defect or patent foramen ovale was identified.    -------------------------------------------------------------------  Right ventricle:  The cavity size was normal. Wall thickness was  normal. Systolic function was normal.   -------------------------------------------------------------------  Pulmonic valve:    Doppler:  Transvalvular velocity was within the  normal range. There was no evidence for stenosis. There was trivial  regurgitation.   -------------------------------------------------------------------  Tricuspid valve:   Structurally normal valve.    Doppler:  Transvalvular velocity was within the normal range. There was mild  regurgitation.   -------------------------------------------------------------------  Pulmonary artery:   The main pulmonary artery was normal-sized.  Systolic pressure was within the normal range.   -------------------------------------------------------------------  Right atrium:  The atrium was normal in size.   -------------------------------------------------------------------  Pericardium:  The pericardium was normal in appearance. There was  no pericardial effusion.   -------------------------------------------------------------------  Systemic veins:  Inferior vena cava: The vessel was normal in size. The  respirophasic diameter changes were in the normal range (= 50%),  consistent with normal central venous pressure. Diameter: 14 mm.   -------------------------------------------------------------------  Post procedure  conclusions  Ascending Aorta:   - The aorta was normal, not dilated, and non-diseased.   EKG:  EKG is no ordered today.    Recent Labs: No results found for requested labs within last 8760 hours.  Recent Lipid Panel No results found for: CHOL, TRIG, HDL, CHOLHDL, VLDL, LDLCALC, LDLDIRECT   Home Medications   Current Meds  Medication Sig   acetaminophen (TYLENOL) 500 MG tablet Take 500-1,000 mg by mouth daily as needed for mild pain or headache.   apixaban (ELIQUIS) 5 MG TABS tablet Take 1 tablet (5 mg total) by mouth 2 (two) times daily.   calcium citrate-vitamin D (CITRACAL+D) 315-200 MG-UNIT tablet Take 2 tablets by mouth 2 (two) times daily.   diltiazem (CARDIZEM CD) 120 MG 24 hr capsule TAKE ONE CAPSULE BY MOUTH DAILY   fluticasone (FLONASE) 50 MCG/ACT nasal spray 1 spray in each nostril   galantamine (RAZADYNE ER) 24 MG 24 hr capsule Take 24 mg by mouth daily with breakfast.   Glucosamine-Chondroitin (OSTEO BI-FLEX REGULAR STRENGTH PO) Take 2 tablets by mouth daily after supper.   HYDROcodone-acetaminophen (NORCO) 5-325 MG  tablet Take 1 tablet by mouth every 6 (six) hours as needed for moderate pain.   ibuprofen (ADVIL) 800 MG tablet Take 1 tablet (800 mg total) by mouth every 6 (six) hours as needed.   levothyroxine (SYNTHROID, LEVOTHROID) 150 MCG tablet Take 150 mcg by mouth See admin instructions. Take 150 mcg before breakfast Monday through Saturday. Skip dose on Sundays   memantine (NAMENDA) 5 MG tablet Take 5 mg by mouth 2 (two) times daily.   Misc Natural Products (OSTEO BI-FLEX ADV TRIPLE ST) TABS Take 2 tablets by mouth at bedtime.   Multiple Vitamin (MULTI-VITAMIN DAILY PO) Take 1 tablet by mouth daily.   Multiple Vitamins-Minerals (PRESERVISION AREDS) CAPS Take 1 capsule by mouth 2 (two) times daily.    nebivolol (BYSTOLIC) 5 MG tablet Take 5 mg by mouth at bedtime.    Omega-3 Fatty Acids (FISH OIL) 1200 MG CAPS Take 2,400 mg by mouth daily.   omeprazole (PRILOSEC) 20  MG capsule Take 20 mg by mouth daily.   oxyCODONE-acetaminophen (PERCOCET) 10-325 MG tablet Take 1 tablet by mouth every 4 (four) hours as needed for pain.   potassium chloride SA (K-DUR,KLOR-CON) 20 MEQ tablet Take 40 mEq by mouth daily.    rosuvastatin (CRESTOR) 20 MG tablet Take 1 tablet (20 mg total) by mouth daily.   triamterene-hydrochlorothiazide (MAXZIDE-25) 37.5-25 MG tablet Take 1 tablet by mouth daily.   [DISCONTINUED] simvastatin (ZOCOR) 20 MG tablet Take 20 mg by mouth at bedtime.     Review of Systems      All other systems reviewed and are otherwise negative except as noted above.  Physical Exam    VS:  BP 120/70   Pulse (!) 56   Ht 5\' 10"  (1.778 m)   Wt 158 lb 6.4 oz (71.8 kg)   SpO2 98%   BMI 22.73 kg/m  , BMI Body mass index is 22.73 kg/m.  Wt Readings from Last 3 Encounters:  01/13/21 158 lb 6.4 oz (71.8 kg)  12/17/19 159 lb 9.6 oz (72.4 kg)  11/05/19 140 lb (63.5 kg)     GEN: Well nourished, well developed, in no acute distress. HEENT: normal. Neck: Supple, no JVD, carotid bruits, or masses. Cardiac: RRR, no murmurs, rubs, or gallops. No clubbing, cyanosis, edema.  Radials/PT 2+ and equal bilaterally.  Respiratory:  Respirations regular and unlabored, clear to auscultation bilaterally. GI: Soft, nontender, nondistended. MS: No deformity or atrophy. Skin: Warm and dry, no rash. Neuro:  Strength and sensation are intact. Psych: Normal affect.  Assessment & Plan    1. Atrial fibrillation, paroxysmal/PVCs/SVT: Feeling well with very rare palpitations. She is in normal sinus rhythm today. Continue Cardizem, Bystolic and Eliquis.        2.CAD without angina: Cath July 2017 with mild CAD. No chest pain. Continue statin and beta blocker. No ASA since she is on Eliquis.     3. Chronic diastolic CHF: Weight stable.  No volume overload on exam.  She has not experienced any lower extremity edema.  Continue Lasix prn. Encouraged low-sodium diet.    4.  Hyperlipidemia: Lipids followed in primary care. LDL 83.  Her zetia was stopped by her PCP due to dizziness.  We have switched her statin to Crestor 20 mg at bedtime.  We will follow-up with a lipid panel and LFTs.  Goal LDL < 70    Disposition: Follow up in 3 months with Lauree Chandler, MD or APP.  Signed, Elgie Collard, PA-C 01/13/2021, 3:29 PM [facility]

## 2021-01-13 NOTE — Patient Instructions (Signed)
Medication Instructions:   DISCONTINUE simvastatin  START Crestor one (1) tablet by mouth ( 20 mg) in the evening.    *If you need a refill on your cardiac medications before your next appointment, please call your pharmacy*   Lab Work:  Your physician recommends that you return for a FASTING lipid profile/lft.  Wednesday, February 8. You can come in on the day of your appointment between 7:30-4:30 fasting from midnight the night before.    If you have labs (blood work) drawn today and your tests are completely normal, you will receive your results only by: [place] (if you have MyChart) OR A paper copy in the mail If you have any lab test that is abnormal or we need to change your treatment, we will call you to review the results.   Testing/Procedures:  -NONE   Follow-Up: At [facility], you and your health needs are our priority.  As part of our continuing mission to provide you with exceptional heart care, we have created designated Provider Care Teams.  These Care Teams include your primary Cardiologist (physician) and Advanced Practice Providers (APPs -  Physician Assistants and Nurse Practitioners) who all work together to provide you with the care you need, when you need it.  We recommend signing up for the patient portal called "MyChart".  Sign up information is provided on this After Visit Summary.  MyChart is used to connect with patients for Virtual Visits (Telemedicine).  Patients are able to view lab/test results, encounter notes, upcoming appointments, etc.  Non-urgent messages can be sent to your provider as well.   To learn more about what you can do with MyChart, go to [email or web address].    Your next appointment:   3 month(s)  The format for your next appointment:   In Person  Provider:   Lauree Chandler, MD     Other Instructions

## 2021-01-21 DIAGNOSIS — E785 Hyperlipidemia, unspecified: Secondary | ICD-10-CM | POA: Diagnosis not present

## 2021-01-21 DIAGNOSIS — K219 Gastro-esophageal reflux disease without esophagitis: Secondary | ICD-10-CM | POA: Diagnosis not present

## 2021-01-21 DIAGNOSIS — I10 Essential (primary) hypertension: Secondary | ICD-10-CM | POA: Diagnosis not present

## 2021-01-21 DIAGNOSIS — E039 Hypothyroidism, unspecified: Secondary | ICD-10-CM | POA: Diagnosis not present

## 2021-02-03 DIAGNOSIS — H353211 Exudative age-related macular degeneration, right eye, with active choroidal neovascularization: Secondary | ICD-10-CM | POA: Diagnosis not present

## 2021-02-11 ENCOUNTER — Encounter: Payer: Medicare Other | Admitting: Podiatry

## 2021-03-16 ENCOUNTER — Other Ambulatory Visit: Payer: Self-pay | Admitting: Cardiovascular Disease

## 2021-03-16 DIAGNOSIS — I480 Paroxysmal atrial fibrillation: Secondary | ICD-10-CM

## 2021-03-16 NOTE — Telephone Encounter (Signed)
Eliquis 5mg  refill request received. Patient is 81 years old, weight-71.8kg, Crea-1.13 on 11/04/2020 via [facility] at [place], [place], and last seen by Nicholes Rough on 01/13/2021. Dose is appropriate based on dosing criteria. Will send in refill to requested pharmacy.

## 2021-03-24 DIAGNOSIS — H353211 Exudative age-related macular degeneration, right eye, with active choroidal neovascularization: Secondary | ICD-10-CM | POA: Diagnosis not present

## 2021-04-01 ENCOUNTER — Other Ambulatory Visit: Payer: Self-pay

## 2021-04-01 ENCOUNTER — Other Ambulatory Visit: Payer: Medicare Other | Admitting: *Deleted

## 2021-04-01 DIAGNOSIS — I5022 Chronic systolic (congestive) heart failure: Secondary | ICD-10-CM | POA: Diagnosis not present

## 2021-04-01 DIAGNOSIS — E785 Hyperlipidemia, unspecified: Secondary | ICD-10-CM

## 2021-04-01 DIAGNOSIS — I2510 Atherosclerotic heart disease of native coronary artery without angina pectoris: Secondary | ICD-10-CM

## 2021-04-01 DIAGNOSIS — I480 Paroxysmal atrial fibrillation: Secondary | ICD-10-CM | POA: Diagnosis not present

## 2021-04-01 LAB — LIPID PANEL
Chol/HDL Ratio: 2.8 ratio (ref 0.0–4.4)
Cholesterol, Total: 164 mg/dL (ref 100–199)
HDL: 58 mg/dL (ref >39)
LDL Chol Calc (NIH): 91 mg/dL (ref 0–99)
Triglycerides: 82 mg/dL (ref 0–149)
VLDL Cholesterol Cal: 15 mg/dL (ref 5–40)

## 2021-04-01 LAB — HEPATIC FUNCTION PANEL
ALT: 20 IU/L (ref 0–32)
AST: 24 IU/L (ref 0–40)
Albumin: 4.1 g/dL (ref 3.7–4.7)
Alkaline Phosphatase: 85 IU/L (ref 44–121)
Bilirubin Total: 0.4 mg/dL (ref 0.0–1.2)
Bilirubin, Direct: 0.12 mg/dL (ref 0.00–0.40)
Total Protein: 6.8 g/dL (ref 6.0–8.5)

## 2021-04-03 ENCOUNTER — Telehealth: Payer: Self-pay | Admitting: *Deleted

## 2021-04-03 DIAGNOSIS — E785 Hyperlipidemia, unspecified: Secondary | ICD-10-CM

## 2021-04-03 MED ORDER — ROSUVASTATIN CALCIUM 40 MG PO TABS: 40.0000 mg | ORAL_TABLET | Freq: Every day | ORAL | 3 refills | Status: DC

## 2021-04-03 NOTE — Telephone Encounter (Signed)
-----   Message from Liliane Shi, [place] sent at 04/01/2021 11:08 PM EST ----- LFTs normal.  LDL above goal.   PLAN:  -Increase Rosuvastatin to 40 mg once daily  -Fasting Lipids, LFTs in 3 mos.  Richardson Dopp, PA-C    04/01/2021 11:06 PM

## 2021-04-07 NOTE — Progress Notes (Signed)
Chief Complaint  Patient presents with   Follow-up    Atrial fib   History of Present Illness: 81 yo female with history of paroxysmal atrial fibrillation, SVT, PVCs, HTN, breast cancer and mild CAD who is here for follow up. She has been known to have paroxysmal atrial fibrillation for years and was started on Xarelto in 2015. She was seen in the ED 11/22/14 with palpitations, nausea and SOB and was found to be in atrial fibrillation with RVR. She converted to sinus in the ED spontaneously. She reported more frequent episodes of palpitations in July 2017 with associated weakness and chest pain. 48 hour monitor July 2017 with PVCs, PACs, and several short runs of SVT. She is now on Eliquis. Cardiac cath 09/18/15 with mild CAD (10% mid RCA stenosis, 20% proximal LAD stenosis). Normal LV systolic function. Most recent echo August 2017 with normal LV size and function, no valve disease. Cardizem was added and she has tolerated. She had a left foot ulcer. Normal ABI August 2021.    She is here today for follow up. The patient denies any chest pain, dyspnea, palpitations, lower extremity edema, orthopnea, PND, dizziness, near syncope or syncope.   Primary Care Physician: Crist Infante, MD  Past Medical History:  Diagnosis Date   Atypical chest pain    a. 08/2015 Cath: LM nl, LAD 20ost, D1 small/nl, D2 mod, nl, RI small, nl, LCX nl, OM1 small, nl, OM2 mod nl, RCA 26m, EF 55-65%.   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 CHA2DS2VASc = 3-->Xarelto since 2015;  b. 08/2015 48h holter in setting of palpitations-->PVC's.   Palpitations    a.  08/2015 48h Holter: pvc's.   Papillary carcinoma (HCC)    Post-menopausal    PVCs (premature ventricular contractions)    Shingles    Thyroid cancer (HCC)    s/p THYROIDECTOMY   Whooping cough    as a child    Past Surgical History:  Procedure Laterality Date    tarsal tunnel release  2002   Right/Left  tarsal tunnel release   CARDIAC CATHETERIZATION  04/21/07   GLOBAL ESTIMATED EF IS 60 %; NORMAL LV FUNCTION; NORMAL CORONARY ARTERIES; BORDERLINE INCREASE IN THE AORTIC ROOT   CARDIAC CATHETERIZATION N/A 09/18/2015   Procedure: Left Heart Cath and Coronary Angiography;  Surgeon: Burnell Blanks, MD;  Location: [facility];  Service: Cardiovascular;  Laterality: N/A;   COLONOSCOPY     CYSTOSCOPY  2007   anterior repair of cystocele with mesh   IR ANGIOGRAM EXTREMITY LEFT  11/05/2019   IR RADIOLOGIST EVAL & MGMT  11/01/2019   IR US GUIDE VASC ACCESS LEFT  11/05/2019   MASTECTOMY  2000   LEFT BREAST   MELANOMA EXCISION  1991   REMOVED FROM LEFT LEG   NASAL SINUS SURGERY     THYROIDECTOMY      Current Outpatient Medications  Medication Sig Dispense Refill   acetaminophen (TYLENOL) 500 MG tablet Take 500-1,000 mg by  mouth daily as needed for mild pain or headache.     apixaban (ELIQUIS) 5 MG TABS tablet Take 1 tablet (5 mg total) by mouth 2 (two) times daily. 60 tablet 5   calcium citrate-vitamin D (CITRACAL+D) 315-200 MG-UNIT tablet Take 2 tablets by mouth 2 (two) times daily.     diltiazem (CARDIZEM CD) 120 MG 24 hr capsule TAKE ONE CAPSULE BY MOUTH DAILY 90 capsule 1   fluticasone (FLONASE) 50 MCG/ACT nasal spray 1 spray in each nostril     galantamine (RAZADYNE ER) 24 MG 24 hr capsule Take 24 mg by mouth daily with breakfast.     Glucosamine-Chondroitin (OSTEO BI-FLEX REGULAR STRENGTH PO) Take 2 tablets by mouth daily after supper.     HYDROcodone-acetaminophen (NORCO) 5-325 MG tablet Take 1 tablet by mouth every 6 (six) hours as needed for moderate pain. 30 tablet 0    ibuprofen (ADVIL) 800 MG tablet Take 1 tablet (800 mg total) by mouth every 6 (six) hours as needed. 60 tablet 1   levothyroxine (SYNTHROID, LEVOTHROID) 150 MCG tablet Take 150 mcg by mouth See admin instructions. Take 150 mcg before breakfast Monday through Saturday. Skip dose on Sundays     memantine (NAMENDA) 5 MG tablet Take 5 mg by mouth 2 (two) times daily.     Misc Natural Products (OSTEO BI-FLEX ADV TRIPLE ST) TABS Take 2 tablets by mouth at bedtime.     Multiple Vitamin (MULTI-VITAMIN DAILY PO) Take 1 tablet by mouth daily.     Multiple Vitamins-Minerals (PRESERVISION AREDS) CAPS Take 1 capsule by mouth 2 (two) times daily.      nebivolol (BYSTOLIC) 5 MG tablet Take 2.5 mg by mouth at bedtime.     Omega-3 Fatty Acids (FISH OIL) 1200 MG CAPS Take 2,400 mg by mouth daily.     omeprazole (PRILOSEC) 20 MG capsule Take 20 mg by mouth daily.     potassium chloride SA (K-DUR,KLOR-CON) 20 MEQ tablet Take 40 mEq by mouth daily.      rosuvastatin (CRESTOR) 40 MG tablet Take 1 tablet (40 mg total) by mouth daily. 90 tablet 3   triamterene-hydrochlorothiazide (MAXZIDE-25) 37.5-25 MG tablet Take 1 tablet by mouth daily.     oxyCODONE-acetaminophen (PERCOCET) 10-325 MG tablet Take 1 tablet by mouth every 4 (four) hours as needed for pain. (Patient not taking: Reported on 04/08/2021) 30 tablet 0   No current facility-administered medications for this visit.    Allergies  Allergen Reactions   Carbamazepine Hives, Rash and Other (See Comments)   Oxaprozin Hives, Rash and Other (See Comments)   Amoxicillin-Pot Clavulanate Diarrhea and Nausea Only   Ciprofibrate Other (See Comments)   Miralax [Polyethylene Glycol] Other (See Comments)   Oxycodone-Acetaminophen     Other reaction(s): Chills (intolerance)   Pedi-Pre Tape Spray [Wound Dressing Adhesive] Other (See Comments)   Prednisolone     Unknown reaction   Ramipril Other (See Comments)   Prednisone Itching and Other (See Comments)    leg  swelling /dyuria     Social History   Socioeconomic History   Marital status: Married    Spouse name: Not on file   Number of children: Not on file   Years of education: Not on file   Highest education level: Not on file  Occupational History   Occupation: RETIRED    Employer: [employer]    Comment: [employer]  Tobacco Use   Smoking status: Never   Smokeless tobacco: Never  Vaping Use  Vaping Use: Never used  Substance and Sexual Activity   Alcohol use: Yes    Comment: RARE   Drug use: No   Sexual activity: Not on file  Other Topics Concern   Not on file  Social History Narrative   RETIRED FROM [employer] @ HOME WITH HER HUSBAND   SHE HAS CO-AUTHORED BOOKS ON IV ADDITIVES   ETOH RARELY   NON-SMOKER         Social Determinants of Health   Financial Resource Strain: Not on file  Food Insecurity: Not on file  Transportation Needs: Not on file  Physical Activity: Not on file  Stress: Not on file  Social Connections: Not on file  Intimate Partner Violence: Not on file    Family History  Problem Relation Age of Onset   Pancreatitis Mother    Heart disease Mother    Kidney disease Mother    Hypertension Father    Macular degeneration Father    Heart disease Father    Breast cancer Neg Hx    Colon cancer Neg Hx    Esophageal cancer Neg Hx    Pancreatic cancer Neg Hx    Stomach cancer Neg Hx     Review of Systems:  As stated in the HPI and otherwise negative.   BP 102/70    Pulse (!) 55    Ht 5\' 10"  (1.778 m)    Wt 156 lb 14.2 oz (71.2 kg)    SpO2 98%    BMI 22.51 kg/m   Physical Examination:  General: Well developed, well nourished, NAD  HEENT: OP clear, mucus membranes moist  SKIN: warm, dry. No rashes. Neuro: No focal deficits  Musculoskeletal: Muscle strength 5/5 all ext  Psychiatric: Mood and affect normal  Neck: No JVD, no carotid bruits, no thyromegaly, no lymphadenopathy.  Lungs:Clear bilaterally, no wheezes,  rhonci, crackles Cardiovascular: Regular rate and rhythm. No murmurs, gallops or rubs. Abdomen:Soft. Bowel sounds present. Non-tender.  Extremities: No lower extremity edema. Pulses are 2 + in the bilateral DP/PT.  Echo August 2017: Left ventricle: The cavity size was normal. Wall thickness was   normal. Systolic function was normal. The estimated ejection   fraction was in the range of 55% to 60%. Wall motion was normal;   there were no regional wall motion abnormalities. Left   ventricular diastolic function parameters were normal. - Atrial septum: No defect or patent foramen ovale was identified.  EKG:  EKG is  ordered today. The ekg ordered today demonstrates sinus bradycardia  Recent Labs: 04/01/2021: ALT 20   Lipid Panel    Component Value Date/Time   CHOL 164 04/01/2021 0904   TRIG 82 04/01/2021 0904   HDL 58 04/01/2021 0904   CHOLHDL 2.8 04/01/2021 0904   LDLCALC 91 04/01/2021 0904     Wt Readings from Last 3 Encounters:  04/08/21 156 lb 14.2 oz (71.2 kg)  01/13/21 158 lb 6.4 oz (71.8 kg)  12/17/19 159 lb 9.6 oz (72.4 kg)     Other studies Reviewed: Additional studies/ records that were reviewed today include: . Review of the above records demonstrates:    Assessment and Plan:   1. Atrial fibrillation, paroxysmal/PVCs/SVT: Rare palpitations. Sinus today on exam. Will continue Cardizem, Bystolic and Eliquis.       2.CAD without angina: Cath July 2017 with mild CAD. She has no chest pain. Will continue statin and beta blocker. No ASA since she is on Eliquis.    3.  Chronic diastolic CHF: Wt stable. No overload on exam. Lasix as needed.   4. Hyperlipidemia: Lipids followed in primary care. LDL 91 last week. Crestor increased to 40 mg daily last week. Repeat lipids and LFTS in 12 weeks.   Current medicines are reviewed at length with the patient today.  The patient does not have concerns regarding medicines.  The following changes have been made:  no  change  Labs/ tests ordered today include:   Orders Placed This Encounter  Procedures   Hepatic function panel   Lipid panel    Disposition:   F/U with me in 12  months  Signed, Lauree Chandler, MD 04/08/2021 10:30 AM    [facility] [address], [place], [place]  [address] Phone: [phone]; Fax: [phone]

## 2021-04-08 ENCOUNTER — Encounter: Payer: Self-pay | Admitting: Cardiovascular Disease

## 2021-04-08 ENCOUNTER — Other Ambulatory Visit: Payer: Self-pay

## 2021-04-08 ENCOUNTER — Ambulatory Visit: Payer: Medicare Other | Admitting: Cardiovascular Disease

## 2021-04-08 VITALS — BP 102/70 | HR 55 | Ht 70.0 in | Wt 156.9 lb

## 2021-04-08 DIAGNOSIS — I2510 Atherosclerotic heart disease of native coronary artery without angina pectoris: Secondary | ICD-10-CM | POA: Diagnosis not present

## 2021-04-08 DIAGNOSIS — I480 Paroxysmal atrial fibrillation: Secondary | ICD-10-CM | POA: Diagnosis not present

## 2021-04-08 DIAGNOSIS — I5032 Chronic diastolic (congestive) heart failure: Secondary | ICD-10-CM | POA: Diagnosis not present

## 2021-04-08 DIAGNOSIS — E7800 Pure hypercholesterolemia, unspecified: Secondary | ICD-10-CM

## 2021-04-08 MED ORDER — ROSUVASTATIN CALCIUM 40 MG PO TABS: 40.0000 mg | ORAL_TABLET | Freq: Every day | ORAL | 3 refills | Status: DC

## 2021-04-08 NOTE — Patient Instructions (Signed)
Medication Instructions:  No changes (take Crestor 40 mg daily)  *If you need a refill on your cardiac medications before your next appointment, please call your pharmacy*   Lab Work: Your physician recommends that you return for lab work in: 3 months (lipids, liver function)  If you have labs (blood work) drawn today and your tests are completely normal, you will receive your results only by: [place] (if you have MyChart) OR A paper copy in the mail If you have any lab test that is abnormal or we need to change your treatment, we will call you to review the results.   Testing/Procedures: none   Follow-Up: At [facility], you and your health needs are our priority.  As part of our continuing mission to provide you with exceptional heart care, we have created designated Provider Care Teams.  These Care Teams include your primary Cardiologist (physician) and Advanced Practice Providers (APPs -  Physician Assistants and Nurse Practitioners) who all work together to provide you with the care you need, when you need it.  We recommend signing up for the patient portal called "MyChart".  Sign up information is provided on this After Visit Summary.  MyChart is used to connect with patients for Virtual Visits (Telemedicine).  Patients are able to view lab/test results, encounter notes, upcoming appointments, etc.  Non-urgent messages can be sent to your provider as well.   To learn more about what you can do with MyChart, go to [email or web address].    Your next appointment:   12 month(s)  The format for your next appointment:   In Person  Provider:   Lauree Chandler, MD     Other Instructions

## 2021-04-08 NOTE — Addendum Note (Signed)
Addended by: Rodman Key on: 04/08/2021 10:41 AM   Modules accepted: Orders

## 2021-05-05 DIAGNOSIS — H353211 Exudative age-related macular degeneration, right eye, with active choroidal neovascularization: Secondary | ICD-10-CM | POA: Diagnosis not present

## 2021-05-22 DIAGNOSIS — E785 Hyperlipidemia, unspecified: Secondary | ICD-10-CM | POA: Diagnosis not present

## 2021-05-22 DIAGNOSIS — K219 Gastro-esophageal reflux disease without esophagitis: Secondary | ICD-10-CM | POA: Diagnosis not present

## 2021-05-22 DIAGNOSIS — E039 Hypothyroidism, unspecified: Secondary | ICD-10-CM | POA: Diagnosis not present

## 2021-05-22 DIAGNOSIS — I10 Essential (primary) hypertension: Secondary | ICD-10-CM | POA: Diagnosis not present

## 2021-06-12 DIAGNOSIS — H353211 Exudative age-related macular degeneration, right eye, with active choroidal neovascularization: Secondary | ICD-10-CM | POA: Diagnosis not present

## 2021-06-26 DIAGNOSIS — H903 Sensorineural hearing loss, bilateral: Secondary | ICD-10-CM | POA: Diagnosis not present

## 2021-07-03 ENCOUNTER — Other Ambulatory Visit: Payer: Medicare Other | Admitting: *Deleted

## 2021-07-03 DIAGNOSIS — E785 Hyperlipidemia, unspecified: Secondary | ICD-10-CM

## 2021-07-03 LAB — HEPATIC FUNCTION PANEL
ALT: 17 IU/L (ref 0–32)
AST: 24 IU/L (ref 0–40)
Albumin: 4.2 g/dL (ref 3.7–4.7)
Alkaline Phosphatase: 69 IU/L (ref 44–121)
Bilirubin Total: 0.4 mg/dL (ref 0.0–1.2)
Bilirubin, Direct: 0.15 mg/dL (ref 0.00–0.40)
Total Protein: 6.6 g/dL (ref 6.0–8.5)

## 2021-07-03 LAB — LIPID PANEL
Chol/HDL Ratio: 2.1 ratio (ref 0.0–4.4)
Cholesterol, Total: 152 mg/dL (ref 100–199)
HDL: 74 mg/dL (ref >39)
LDL Chol Calc (NIH): 65 mg/dL (ref 0–99)
Triglycerides: 67 mg/dL (ref 0–149)
VLDL Cholesterol Cal: 13 mg/dL (ref 5–40)

## 2021-07-06 NOTE — Progress Notes (Signed)
Pt has been made aware of normal result and verbalized understanding.  jw

## 2021-08-04 DIAGNOSIS — H353211 Exudative age-related macular degeneration, right eye, with active choroidal neovascularization: Secondary | ICD-10-CM | POA: Diagnosis not present

## 2021-08-12 ENCOUNTER — Other Ambulatory Visit: Payer: Self-pay | Admitting: Cardiovascular Disease

## 2021-08-12 DIAGNOSIS — I480 Paroxysmal atrial fibrillation: Secondary | ICD-10-CM

## 2021-08-12 NOTE — Telephone Encounter (Signed)
Prescription refill request for Eliquis received. Indication: Afib  Last office visit: 04/08/21 Angelena Form)  Scr: 1.13 (11/04/20 via [facility]) Age: 81 Weight: 71.2kg  Appropriate dose and refill sent to requested pharmacy.

## 2021-08-31 DIAGNOSIS — R040 Epistaxis: Secondary | ICD-10-CM | POA: Diagnosis not present

## 2021-09-02 DIAGNOSIS — H903 Sensorineural hearing loss, bilateral: Secondary | ICD-10-CM | POA: Diagnosis not present

## 2021-09-08 ENCOUNTER — Other Ambulatory Visit: Payer: Self-pay | Admitting: Internal Medicine

## 2021-09-08 DIAGNOSIS — Z1231 Encounter for screening mammogram for malignant neoplasm of breast: Secondary | ICD-10-CM

## 2021-09-16 ENCOUNTER — Ambulatory Visit: Payer: Medicare Other | Admitting: Podiatry

## 2021-09-16 DIAGNOSIS — G5762 Lesion of plantar nerve, left lower limb: Secondary | ICD-10-CM

## 2021-09-16 DIAGNOSIS — M7752 Other enthesopathy of left foot: Secondary | ICD-10-CM

## 2021-09-16 NOTE — Progress Notes (Signed)
Subjective:  Patient ID: Christina [name], female    DOB: 12-Mar-1940,  MRN: [id number]  Chief Complaint  Patient presents with   Foot Pain    81 y.o. female presents with the above complaint.  Patient presents with complaint of left fifth metatarsophalangeal joint capsulitis.  Patient states pain for touch is progressive gotten worse.  Hurts with ambulation.  She states she is doing fine from the surgical standpoint.  She is here with his her husband.  They have not seen anyone else prior to seeing me.   Review of Systems: Negative except as noted in the HPI. Denies N/V/F/Ch.  Past Medical History:  Diagnosis Date   Atypical chest pain    a. 08/2015 Cath: LM nl, LAD 20ost, D1 small/nl, D2 mod, nl, RI small, nl, LCX nl, OM1 small, nl, OM2 mod nl, RCA 55m EF 55-65%.   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 CHA2DS2VASc = 3-->Xarelto since 2015;  b. 08/2015 48h holter in setting of palpitations-->PVC's.   Palpitations    a. 08/2015 48h Holter: pvc's.   Papillary carcinoma (HCC)    Post-menopausal    PVCs (premature ventricular contractions)    Shingles    Thyroid cancer (HCC)    s/p THYROIDECTOMY   Whooping cough    as a child    Current Outpatient Medications:    acetaminophen (TYLENOL) 500 MG tablet, Take 500-1,000 mg by mouth daily as needed for mild pain or headache., Disp: , Rfl:    calcium citrate-vitamin D (CITRACAL+D) 315-200 MG-UNIT tablet, Take 2 tablets by mouth 2 (two) times daily., Disp: , Rfl:    diltiazem (CARDIZEM CD) 120 MG 24 hr capsule, TAKE ONE CAPSULE BY MOUTH DAILY, Disp: 90 capsule, Rfl: 1    ELIQUIS 5 MG TABS tablet, TAKE 1 TABLET BY MOUTH TWICE A DAY, Disp: 60 tablet, Rfl: 5   fluticasone (FLONASE) 50 MCG/ACT nasal spray, 1 spray in each nostril, Disp: , Rfl:    galantamine (RAZADYNE ER) 24 MG 24 hr capsule, Take 24 mg by mouth daily with breakfast., Disp: , Rfl:    Glucosamine-Chondroitin (OSTEO BI-FLEX REGULAR STRENGTH PO), Take 2 tablets by mouth daily after supper., Disp: , Rfl:    HYDROcodone-acetaminophen (NORCO) 5-325 MG tablet, Take 1 tablet by mouth every 6 (six) hours as needed for moderate pain., Disp: 30 tablet, Rfl: 0   ibuprofen (ADVIL) 800 MG tablet, Take 1 tablet (800 mg total) by mouth every 6 (six) hours as needed., Disp: 60 tablet, Rfl: 1   levothyroxine (SYNTHROID, LEVOTHROID) 150 MCG tablet, Take 150 mcg by mouth See admin instructions. Take 150 mcg before breakfast Monday through Saturday. Skip dose on Sundays, Disp: , Rfl:    memantine (NAMENDA) 5 MG tablet, Take 5 mg by mouth 2 (two) times daily., Disp: , Rfl:    Misc Natural Products (OSTEO BI-FLEX ADV TRIPLE ST) TABS, Take 2 tablets by mouth at bedtime., Disp: , Rfl:    Multiple Vitamin (MULTI-VITAMIN DAILY PO), Take 1 tablet  by mouth daily., Disp: , Rfl:    Multiple Vitamins-Minerals (PRESERVISION AREDS) CAPS, Take 1 capsule by mouth 2 (two) times daily. , Disp: , Rfl:    nebivolol (BYSTOLIC) 5 MG tablet, Take 2.5 mg by mouth at bedtime., Disp: , Rfl:    Omega-3 Fatty Acids (FISH OIL) 1200 MG CAPS, Take 2,400 mg by mouth daily., Disp: , Rfl:    omeprazole (PRILOSEC) 20 MG capsule, Take 20 mg by mouth daily., Disp: , Rfl:    oxyCODONE-acetaminophen (PERCOCET) 10-325 MG tablet, Take 1 tablet by mouth every 4 (four) hours as needed for pain. (Patient not taking: Reported on 04/08/2021), Disp: 30 tablet, Rfl: 0   potassium chloride SA (K-DUR,KLOR-CON) 20 MEQ tablet, Take 40 mEq by mouth daily. , Disp: , Rfl:    rosuvastatin (CRESTOR) 40 MG tablet, Take 1 tablet (40 mg total) by mouth daily., Disp: 90 tablet, Rfl:  3   triamterene-hydrochlorothiazide (MAXZIDE-25) 37.5-25 MG tablet, Take 1 tablet by mouth daily., Disp: , Rfl:   Social History   Tobacco Use  Smoking Status Never  Smokeless Tobacco Never    Allergies  Allergen Reactions   Carbamazepine Hives, Rash and Other (See Comments)   Oxaprozin Hives, Rash and Other (See Comments)   Amoxicillin-Pot Clavulanate Diarrhea and Nausea Only   Ciprofibrate Other (See Comments)   Miralax [Polyethylene Glycol] Other (See Comments)   Oxycodone-Acetaminophen     Other reaction(s): Chills (intolerance)   Pedi-Pre Tape Spray [Wound Dressing Adhesive] Other (See Comments)   Prednisolone     Unknown reaction   Ramipril Other (See Comments)   Prednisone Itching and Other (See Comments)    leg swelling /dyuria    Objective:  There were no vitals filed for this visit. There is no height or weight on file to calculate BMI. Constitutional Well developed. Well nourished.  Vascular Dorsalis pedis pulses palpable bilaterally. Posterior tibial pulses palpable bilaterally. Capillary refill normal to all digits.  No cyanosis or clubbing noted. Pedal hair growth normal.  Neurologic Normal speech. Oriented to person, place, and time. Epicritic sensation to light touch grossly present bilaterally.  Dermatologic Nails well groomed and normal in appearance. No open wounds. No skin lesions.  Orthopedic: Left fifth metatarsophalangeal joint capsulitis versus neuroma.  Positive Mulder's click noted in the fourth interspace.  Pain with range of motion of the fifth metatarsophalangeal joint.  No extensor or flexor tendinitis noted.  No wounds or open lesions noted   Radiographs: None Assessment:   1. Morton's neuroma of left foot   2. Capsulitis of metatarsophalangeal (MTP) joint of left foot    Plan:  Patient was evaluated and treated and all questions answered.  Left fifth metatarsophalangeal joint capsulitis versus neuroma -All questions and concerns  were discussed with the patient in extensive detail. - The patient the etiology of capsulitis and worse treatment options were discussed.  Given the amount of pain that she is having she will benefit from steroid injection to help decrease acute inflammatory component associate with pain.  Patient agrees with plan like to proceed with steroid injection -A steroid injection was performed at left fifth MTP using 1% plain Lidocaine and 10 mg of Kenalog. This was well tolerated. -I discussed shoe gear modification and offloading with over-the-counter insoles in extensive detail that based on understanding will make those changes.   No follow-ups on file.

## 2021-09-18 DIAGNOSIS — Z85820 Personal history of malignant melanoma of skin: Secondary | ICD-10-CM | POA: Diagnosis not present

## 2021-09-18 DIAGNOSIS — Z85828 Personal history of other malignant neoplasm of skin: Secondary | ICD-10-CM | POA: Diagnosis not present

## 2021-09-18 DIAGNOSIS — L509 Urticaria, unspecified: Secondary | ICD-10-CM | POA: Diagnosis not present

## 2021-09-26 IMAGING — MG MM DIGITAL SCREENING UNILAT*R* W/ TOMO W/ CAD
6 series · 6 of 18 positions shown · non-contrast
Comparison: Previous exam(s).

CLINICAL DATA: Screening.

EXAM:
DIGITAL SCREENING UNILATERAL RIGHT MAMMOGRAM WITH CAD AND
TOMOSYNTHESIS
TECHNIQUE: Right screening digital craniocaudal and mediolateral oblique
mammograms were obtained. Right screening digital breast
tomosynthesis was performed. The images were evaluated with
computer-aided detection.

[R MLO synth-2D]
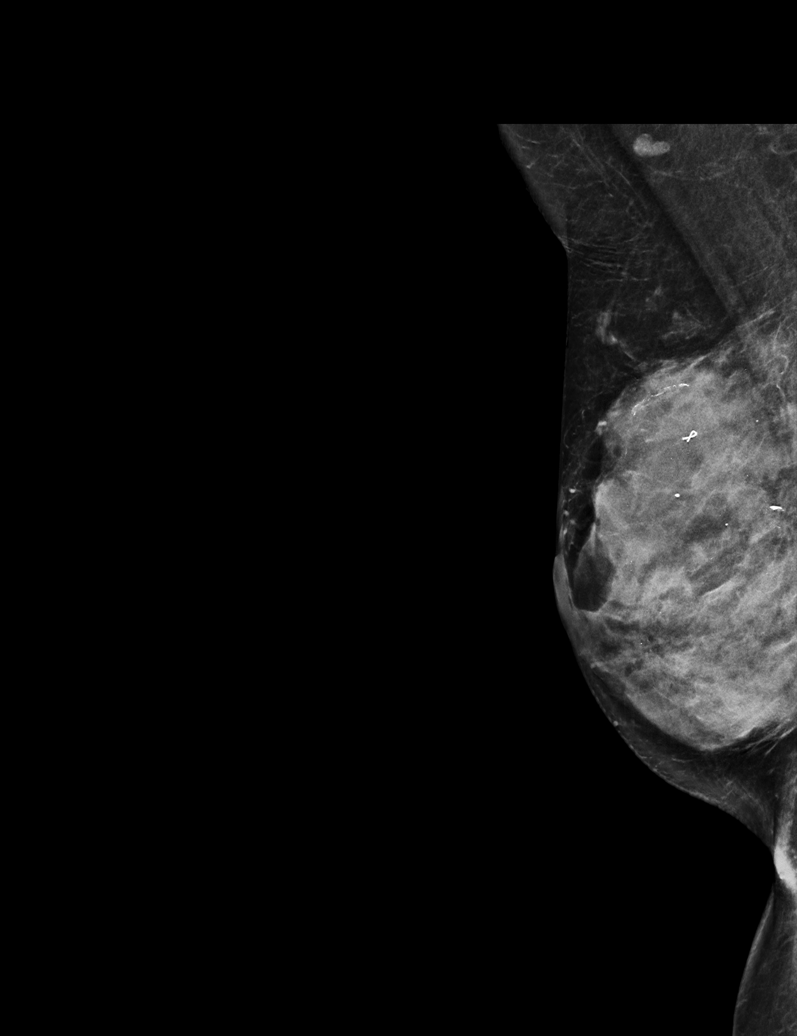

[R XCCL synth-2D]
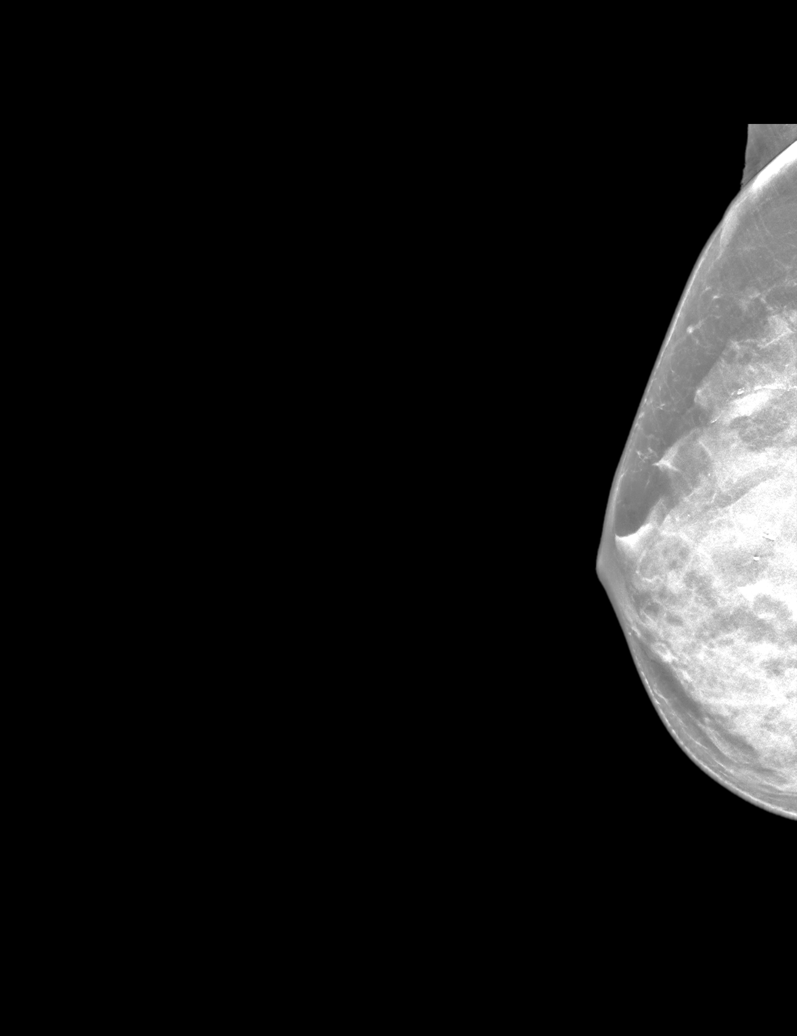

[R CC synth-2D]
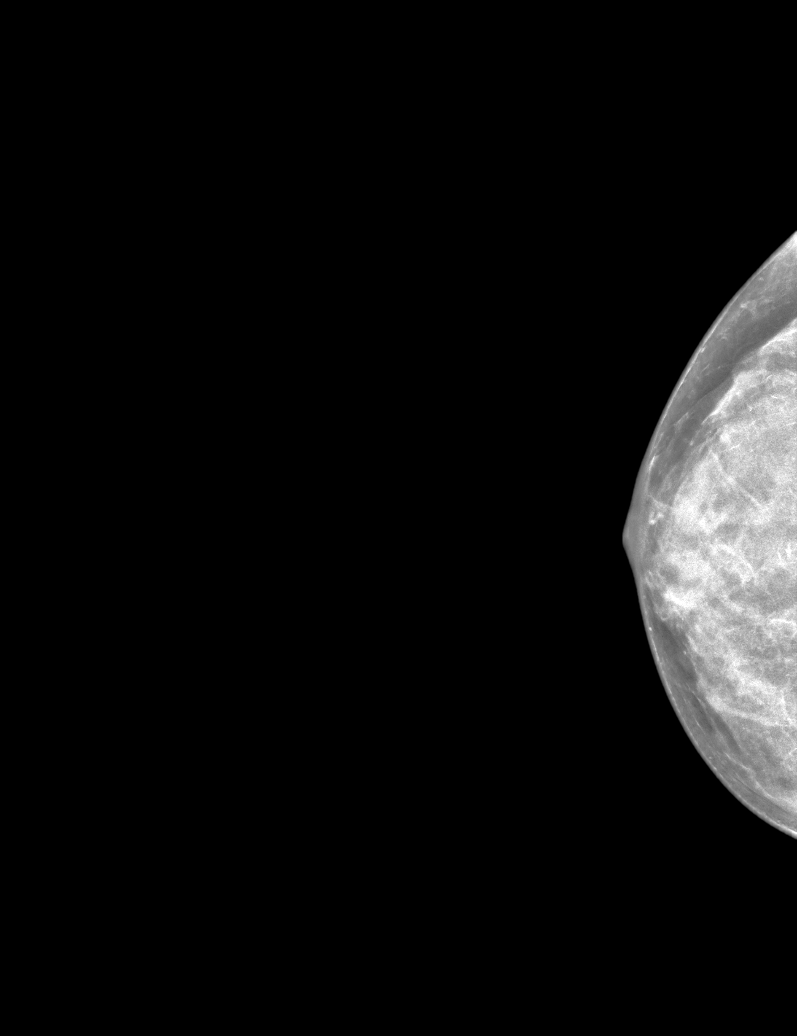

[R CC tomo · tomo slice 24/47.0]
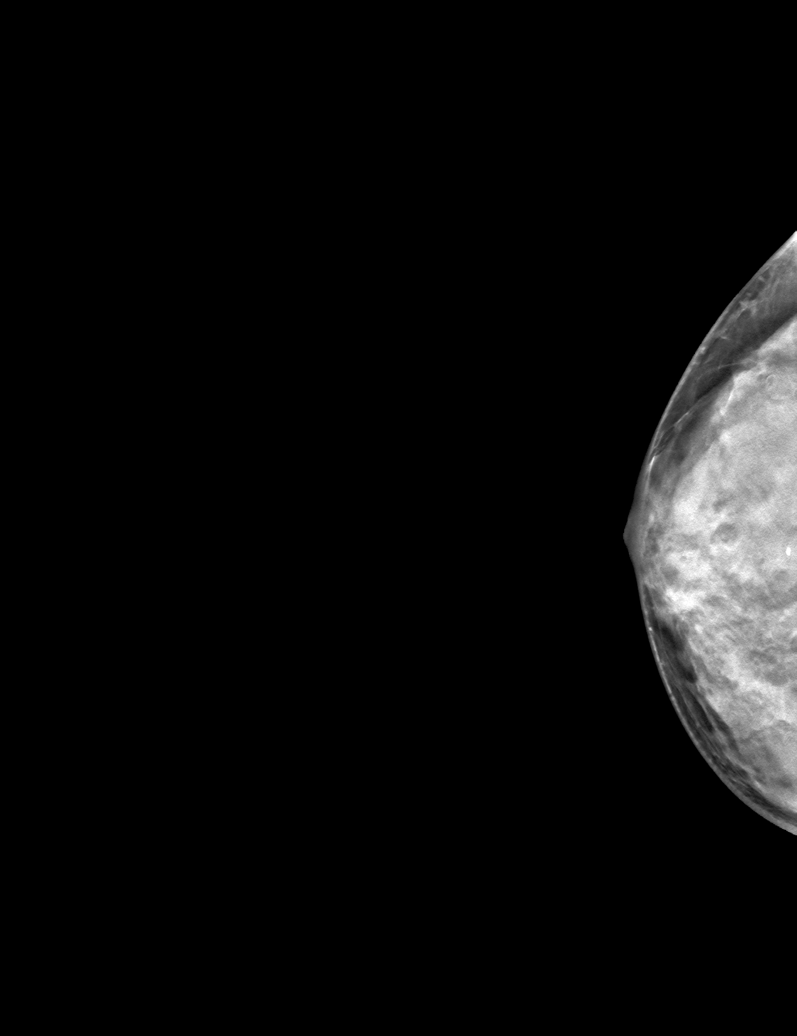

[R XCCL tomo · tomo slice 26/51.0]
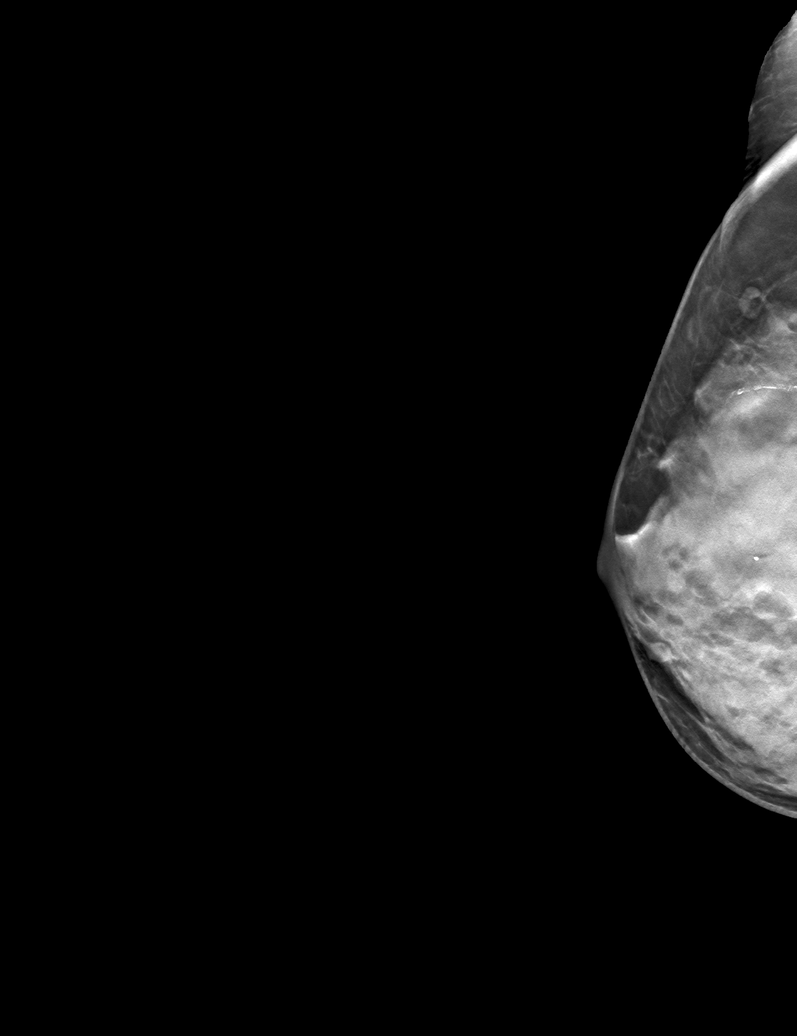

[R MLO tomo · tomo slice 27/53.0]
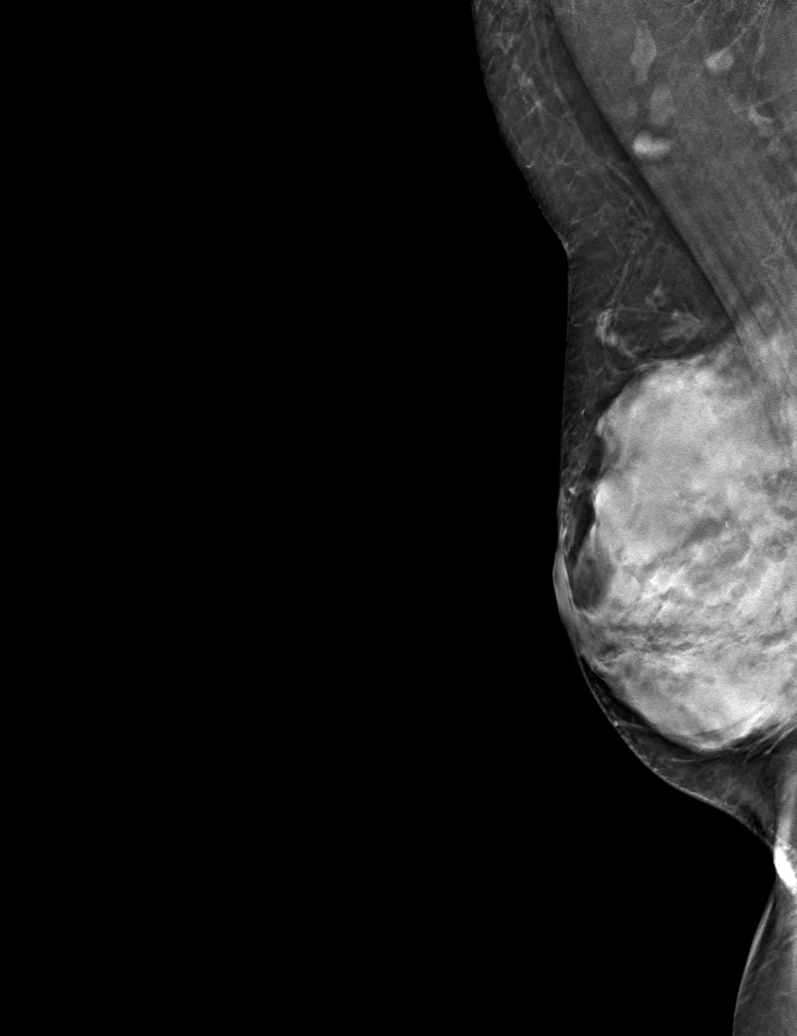

[6 of 18 positions shown; findings below may reference images not displayed]

ACR Breast Density Category d: The breast tissue is extremely dense,
which lowers the sensitivity of mammography
FINDINGS: There are no findings suspicious for malignancy.
IMPRESSION: No mammographic evidence of malignancy. A result letter of this
screening mammogram will be mailed directly to the patient.

RECOMMENDATION:
Screening mammogram in one year. (Code:TE-G-XEZ)

BI-RADS CATEGORY  1: Negative.

## 2021-10-13 DIAGNOSIS — H353211 Exudative age-related macular degeneration, right eye, with active choroidal neovascularization: Secondary | ICD-10-CM | POA: Diagnosis not present

## 2021-10-20 DIAGNOSIS — I10 Essential (primary) hypertension: Secondary | ICD-10-CM | POA: Diagnosis not present

## 2021-10-20 DIAGNOSIS — E785 Hyperlipidemia, unspecified: Secondary | ICD-10-CM | POA: Diagnosis not present

## 2021-10-20 DIAGNOSIS — R7989 Other specified abnormal findings of blood chemistry: Secondary | ICD-10-CM | POA: Diagnosis not present

## 2021-10-20 DIAGNOSIS — E039 Hypothyroidism, unspecified: Secondary | ICD-10-CM | POA: Diagnosis not present

## 2021-10-23 ENCOUNTER — Ambulatory Visit: Payer: Medicare Other

## 2021-10-27 DIAGNOSIS — E039 Hypothyroidism, unspecified: Secondary | ICD-10-CM | POA: Diagnosis not present

## 2021-10-27 DIAGNOSIS — D6869 Other thrombophilia: Secondary | ICD-10-CM | POA: Diagnosis not present

## 2021-10-27 DIAGNOSIS — R82998 Other abnormal findings in urine: Secondary | ICD-10-CM | POA: Diagnosis not present

## 2021-10-27 DIAGNOSIS — D320 Benign neoplasm of cerebral meninges: Secondary | ICD-10-CM | POA: Diagnosis not present

## 2021-10-27 DIAGNOSIS — Z0000 Encounter for general adult medical examination without abnormal findings: Secondary | ICD-10-CM | POA: Diagnosis not present

## 2021-10-29 DIAGNOSIS — D2262 Melanocytic nevi of left upper limb, including shoulder: Secondary | ICD-10-CM | POA: Diagnosis not present

## 2021-10-29 DIAGNOSIS — Z85828 Personal history of other malignant neoplasm of skin: Secondary | ICD-10-CM | POA: Diagnosis not present

## 2021-10-29 DIAGNOSIS — D2239 Melanocytic nevi of other parts of face: Secondary | ICD-10-CM | POA: Diagnosis not present

## 2021-10-29 DIAGNOSIS — Z85820 Personal history of malignant melanoma of skin: Secondary | ICD-10-CM | POA: Diagnosis not present

## 2021-11-17 ENCOUNTER — Ambulatory Visit
Admission: RE | Admit: 2021-11-17 | Discharge: 2021-11-17 | Disposition: A | Discharge status: Home / Self Care | Payer: Medicare Other | Source: Ambulatory Visit | Attending: Internal Medicine | Admitting: Internal Medicine

## 2021-11-17 DIAGNOSIS — Z1231 Encounter for screening mammogram for malignant neoplasm of breast: Secondary | ICD-10-CM

## 2021-12-07 DIAGNOSIS — Z23 Encounter for immunization: Secondary | ICD-10-CM | POA: Diagnosis not present

## 2022-01-05 DIAGNOSIS — H353211 Exudative age-related macular degeneration, right eye, with active choroidal neovascularization: Secondary | ICD-10-CM | POA: Diagnosis not present

## 2022-01-06 ENCOUNTER — Ambulatory Visit
Admission: RE | Admit: 2022-01-06 | Discharge: 2022-01-06 | Disposition: A | Discharge status: Home / Self Care | Payer: Medicare Other | Source: Ambulatory Visit | Attending: Registered Nurse | Admitting: Registered Nurse

## 2022-01-06 ENCOUNTER — Other Ambulatory Visit: Payer: Self-pay | Admitting: Registered Nurse

## 2022-01-06 DIAGNOSIS — M4316 Spondylolisthesis, lumbar region: Secondary | ICD-10-CM | POA: Diagnosis not present

## 2022-01-06 DIAGNOSIS — R109 Unspecified abdominal pain: Secondary | ICD-10-CM

## 2022-01-06 DIAGNOSIS — R1030 Lower abdominal pain, unspecified: Secondary | ICD-10-CM | POA: Diagnosis not present

## 2022-01-06 DIAGNOSIS — R630 Anorexia: Secondary | ICD-10-CM | POA: Diagnosis not present

## 2022-01-06 DIAGNOSIS — I10 Essential (primary) hypertension: Secondary | ICD-10-CM | POA: Diagnosis not present

## 2022-01-06 DIAGNOSIS — K3189 Other diseases of stomach and duodenum: Secondary | ICD-10-CM | POA: Diagnosis not present

## 2022-01-06 DIAGNOSIS — R197 Diarrhea, unspecified: Secondary | ICD-10-CM | POA: Diagnosis not present

## 2022-01-06 DIAGNOSIS — R634 Abnormal weight loss: Secondary | ICD-10-CM | POA: Diagnosis not present

## 2022-01-06 DIAGNOSIS — K7689 Other specified diseases of liver: Secondary | ICD-10-CM | POA: Diagnosis not present

## 2022-01-06 MED ORDER — IOPAMIDOL (ISOVUE-300) INJECTION 61%
100.0000 mL | Freq: Once | INTRAVENOUS | Status: AC | PRN
  Administered 2022-01-06: 100 mL via INTRAVENOUS

## 2022-03-22 ENCOUNTER — Other Ambulatory Visit: Payer: Self-pay | Admitting: Cardiovascular Disease

## 2022-03-22 DIAGNOSIS — I480 Paroxysmal atrial fibrillation: Secondary | ICD-10-CM

## 2022-03-22 NOTE — Telephone Encounter (Addendum)
Prescription refill request for Eliquis received. Indication: Afib  Last office visit: 04/08/21 Angelena Form)  Scr: 0.9 (01/06/22 via PCP)  Age: 82 Weight: 71.2kg  Received labs from PCP.  Appropriate dose. Refill sent.

## 2022-03-24 DIAGNOSIS — K080 Exfoliation of teeth due to systemic causes: Secondary | ICD-10-CM | POA: Diagnosis not present

## 2022-03-26 DIAGNOSIS — M25562 Pain in left knee: Secondary | ICD-10-CM | POA: Diagnosis not present

## 2022-03-31 DIAGNOSIS — K080 Exfoliation of teeth due to systemic causes: Secondary | ICD-10-CM | POA: Diagnosis not present

## 2022-04-04 NOTE — Progress Notes (Unsigned)
Office Visit    Patient Name: Christina [name] Date of Encounter: 04/05/2022  PCP:  Crist Infante, [place]  Cardiologist:  Lauree Chandler, MD  Advanced Practice Provider:  No care team member to display Electrophysiologist:  None    Chief Complaint    Christina [name] is a 82 y.o. female with a hx of paroxysmal atrial fibrillation, SVT, PVCs, HTN, breast cancer, and mild CAD presents today for her annual follow-up appointment.    Past Medical History    Past Medical History:  Diagnosis Date   Atypical chest pain    a. 08/2015 Cath: LM nl, LAD 20ost, D1 small/nl, D2 mod, nl, RI small, nl, LCX nl, OM1 small, nl, OM2 mod nl, RCA 65m EF 55-65%.   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 CHA2DS2VASc = 3-->Xarelto since 2015;  b. 08/2015 48h holter in setting of palpitations-->PVC's.   Palpitations    a. 08/2015 48h Holter: pvc's.   Papillary carcinoma (HCC)    Post-menopausal    PVCs (premature ventricular contractions)    Shingles    Thyroid cancer (HCC)    s/p THYROIDECTOMY   Whooping cough    as a child   Past Surgical History:  Procedure Laterality Date    tarsal tunnel release  2002   Right/Left  tarsal tunnel release   CARDIAC CATHETERIZATION  04/21/07   GLOBAL ESTIMATED EF IS 60 %; NORMAL LV FUNCTION; NORMAL CORONARY ARTERIES; BORDERLINE INCREASE IN THE AORTIC ROOT   CARDIAC CATHETERIZATION N/A 09/18/2015   Procedure: Left Heart Cath and Coronary Angiography;  Surgeon: CBurnell Blanks MD;  [facility];  Service: Cardiovascular;   Laterality: N/A;   COLONOSCOPY     CYSTOSCOPY  2007   anterior repair of cystocele with mesh   IR ANGIOGRAM EXTREMITY LEFT  11/05/2019   IR RADIOLOGIST EVAL & MGMT  11/01/2019   IR [place] ACCESS LEFT  11/05/2019   MASTECTOMY  2000   LEFT BREAST   MELANOMA EXCISION  1991   REMOVED FROM LEFT LEG   NASAL SINUS SURGERY     THYROIDECTOMY      Allergies  Allergies  Allergen Reactions   Carbamazepine Hives, Rash and Other (See Comments)   Oxaprozin Hives, Rash and Other (See Comments)   Amoxicillin-Pot Clavulanate Diarrhea and Nausea Only   Ciprofibrate Other (See Comments)   Miralax [Polyethylene Glycol] Other (See Comments)   Oxycodone-Acetaminophen     Other reaction(s): Chills (intolerance)   Pedi-Pre Tape Spray [Wound Dressing Adhesive] Other (See Comments)   Prednisolone     Unknown reaction   Ramipril Other (See Comments)   Prednisone Itching and Other (See Comments)    leg swelling /dyuria     History of Present Illness    JAleyda Newbornis a 82y.o. female with a hx of hx of paroxysmal atrial fibrillation, SVT, PVCs, HTN, breast cancer, and mild CAD presents today for her annual  follow-up appointment last seen 12/17/2019 by Dr. Angelena Form.  She was having some atrial fibrillation and she was started on Xarelto back in 2015. She was seen in the ED 9/16 with palpitations, nausea and SOB and was found to be in atrial fibrillation with RVR. She converted to sinus in the ED spontaneously. She reported more frequent episodes of palpitations in July 2017 with associated weakness and chest pain. 48 hour monitor July 2017 with PVCs, PACs, and several short runs of SVT. She is now on Eliquis. Cardiac cath 09/18/15 with mild CAD (10% mid RCA stenosis, 20% proximal LAD stenosis). Normal LV systolic function. Most recent echo August 2017 with normal LV size and function, no valve disease. Cardizem was added and she has tolerated. She has had a left foot ulcer. Normal ABI August  2021.   At her last appointment she denied chest pain, dyspnea, palpitations, lower extremity edema, orthopnea, PND, dizziness, near syncope or syncope. Her BP was well controlled. LDL was 66 on statin and zetia. Her weight was stable and she was euvolemic on exam. There were no medication changes indicated at this time.    Seen by me 11/22, and she has been feeling pretty good over the past year.  Dr. Haynes Kerns her primary care stopped her Zetia due to some dizziness.  She is still taking her simvastatin.  However, her last LDL was in June 2022 which was 83.  Due to her history recommend an LDL of less than 70.  Otherwise, she denies shortness of breath, chest pain, palpitations, and lower leg swelling.  She is slightly bradycardic today with heart rate of 56 however she is asymptomatic.  Her blood pressure is well controlled today in the clinic and was 120/70.  Her blood pressure is well controlled at home.  She does have history of atrial fibrillation but has been well controlled for years on Bystolic and Cardizem.  She remains on Eliquis without any significant bleeding.  She was seen 04/08/21 by Dr. Angelena Form and at that time she was feeling well without any CV symptoms.   Today, she feels good without complaint. Her HR is in the 50s but she is asymptomatic. She does not have any chest pains or SOB. She does not have palpitations. Trace edema occasionally but goes away with elevation of her legs. She feels like she can do everything she wants to do.  Reports no shortness of breath nor dyspnea on exertion. Reports no chest pain, pressure, or tightness. No edema, orthopnea, PND. Reports no palpitations.    EKGs/Labs/Other Studies Reviewed:   The following studies were reviewed today:  Echocardiogram 10/09/2015  Study Conclusions   - Left ventricle: The cavity size was normal. Wall thickness was    normal. Systolic function was normal. The estimated ejection    fraction was in the range of 55% to  60%. Wall motion was normal;    there were no regional wall motion abnormalities. Left    ventricular diastolic function parameters were normal.  - Atrial septum: No defect or patent foramen ovale was identified.  - Impressions: NOrmal GLS -19.5. Normal GLS -19.5.   Impressions:   - NOrmal GLS -19.5. Normal GLS -19.5.   -------------------------------------------------------------------  Study data:  The previous study was not available, so comparison  was made to the report of 08/20/2010.  Study status:  Routine.  Procedure:  The patient reported no pain pre or post test.  Transthoracic echocardiography. Image quality was adequate.   Transthoracic echocardiography.  M-mode,  complete 2D, spectral  Doppler, and color Doppler.  Birthdate:  Patient birthdate:  07-27-1940.  Age:  Patient is 82 yr old.  Sex:  Gender: female.  BMI: 22.6 kg/m^2.  Blood pressure:     137/88  Patient status:  Outpatient.  Study date:  Study date: 10/09/2015. Study time: 07:48  AM.  Location:  [facility]   -------------------------------------------------------------------   -------------------------------------------------------------------  Left ventricle:  The cavity size was normal. Wall thickness was  normal. Systolic function was normal. The estimated ejection  fraction was in the range of 55% to 60%. Wall motion was normal;  there were no regional wall motion abnormalities. The transmitral  flow pattern was normal. The deceleration time of the early  transmitral flow velocity was normal. The pulmonary vein flow  pattern was normal. The tissue Doppler parameters were normal. Left  ventricular diastolic function parameters were normal.   -------------------------------------------------------------------  Aortic valve:   Structurally normal valve. Trileaflet; normal  thickness leaflets. Cusp separation was normal. Mobility was not  restricted.  Doppler:  Transvalvular velocity was within the  normal  range. There was no stenosis. There was no regurgitation.   -------------------------------------------------------------------  Aorta:  The aorta was normal, not dilated, and non-diseased. Aortic  root: The aortic root was normal in size.   -------------------------------------------------------------------  Mitral valve:   Structurally normal valve.   Leaflet separation was  normal. Mobility was not restricted.  Doppler:  Transvalvular  velocity was within the normal range. There was no evidence for  stenosis. There was no significant regurgitation.    Peak gradient  (D): 4 mm Hg.   -------------------------------------------------------------------  Left atrium:  The atrium was normal in size.   -------------------------------------------------------------------  Atrial septum:  No defect or patent foramen ovale was identified.    -------------------------------------------------------------------  Right ventricle:  The cavity size was normal. Wall thickness was  normal. Systolic function was normal.   -------------------------------------------------------------------  Pulmonic valve:    Doppler:  Transvalvular velocity was within the  normal range. There was no evidence for stenosis. There was trivial  regurgitation.   -------------------------------------------------------------------  Tricuspid valve:   Structurally normal valve.    Doppler:  Transvalvular velocity was within the normal range. There was mild  regurgitation.   -------------------------------------------------------------------  Pulmonary artery:   The main pulmonary artery was normal-sized.  Systolic pressure was within the normal range.   -------------------------------------------------------------------  Right atrium:  The atrium was normal in size.   -------------------------------------------------------------------  Pericardium:  The pericardium was normal in appearance. There was  no  pericardial effusion.   -------------------------------------------------------------------  Systemic veins:  Inferior vena cava: The vessel was normal in size. The  respirophasic diameter changes were in the normal range (= 50%),  consistent with normal central venous pressure. Diameter: 14 mm.   -------------------------------------------------------------------  Post procedure conclusions  Ascending Aorta:   - The aorta was normal, not dilated, and non-diseased.   EKG:  EKG is  ordered today.  NSR (sinus bradycardia) rate 51 bpm.   Recent Labs: 07/03/2021: ALT 17  Recent Lipid Panel    Component Value Date/Time   CHOL 152 07/03/2021 1148   TRIG 67 07/03/2021 1148   HDL 74 07/03/2021 1148   CHOLHDL 2.1 07/03/2021 1148   LDLCALC 65 07/03/2021 1148     Home Medications   Current Meds  Medication Sig   acetaminophen (TYLENOL) 500 MG tablet Take 500-1,000 mg by mouth daily as needed for mild pain or headache.   apixaban (ELIQUIS) 5  MG TABS tablet TAKE 1 TABLET BY MOUTH TWICE A DAY   calcium citrate-vitamin D (CITRACAL+D) 315-200 MG-UNIT tablet Take 2 tablets by mouth 2 (two) times daily.   cetirizine (ZYRTEC) 10 MG tablet Take 10 mg by mouth as needed for allergies.   diltiazem (CARDIZEM CD) 120 MG 24 hr capsule TAKE ONE CAPSULE BY MOUTH DAILY   fluticasone (FLONASE) 50 MCG/ACT nasal spray 1 spray in each nostril   Fluticasone Propionate, Inhal, 50 MCG/ACT AEPB Inhale 2 puffs into the lungs as needed.   galantamine (RAZADYNE ER) 24 MG 24 hr capsule Take 24 mg by mouth daily with breakfast.   Glucosamine-Chondroitin (OSTEO BI-FLEX REGULAR STRENGTH PO) Take 2 tablets by mouth daily after supper.   HYDROcodone-acetaminophen (NORCO) 5-325 MG tablet Take 1 tablet by mouth every 6 (six) hours as needed for moderate pain.   ibuprofen (ADVIL) 800 MG tablet Take 1 tablet (800 mg total) by mouth every 6 (six) hours as needed.   levothyroxine (SYNTHROID, LEVOTHROID) 150 MCG tablet Take  150 mcg by mouth See admin instructions. Take 150 mcg before breakfast Monday through Saturday. Skip dose on Sundays   memantine (NAMENDA) 5 MG tablet Take 5 mg by mouth 2 (two) times daily.   Misc Natural Products (OSTEO BI-FLEX ADV TRIPLE ST) TABS Take 2 tablets by mouth at bedtime.   Multiple Vitamin (MULTI-VITAMIN DAILY PO) Take 1 tablet by mouth daily.   Multiple Vitamins-Minerals (PRESERVISION AREDS) CAPS Take 1 capsule by mouth 2 (two) times daily.    nebivolol (BYSTOLIC) 5 MG tablet Take 2.5 mg by mouth at bedtime.   Omega-3 Fatty Acids (FISH OIL) 1200 MG CAPS Take 2,400 mg by mouth daily.   omeprazole (PRILOSEC) 20 MG capsule Take 20 mg by mouth daily.   oxyCODONE-acetaminophen (PERCOCET) 10-325 MG tablet Take 1 tablet by mouth every 4 (four) hours as needed for pain.   potassium chloride SA (K-DUR,KLOR-CON) 20 MEQ tablet Take 40 mEq by mouth daily.    rosuvastatin (CRESTOR) 40 MG tablet Take 1 tablet (40 mg total) by mouth daily.   triamterene-hydrochlorothiazide (MAXZIDE-25) 37.5-25 MG tablet Take 1 tablet by mouth daily.     Review of Systems      All other systems reviewed and are otherwise negative except as noted above.  Physical Exam    VS:  BP 130/82   Pulse (!) 51   Ht 5' 10"$  (1.778 m)   Wt 156 lb 6.4 oz (70.9 kg)   SpO2 98%   BMI 22.44 kg/m  , BMI Body mass index is 22.44 kg/m.  Wt Readings from Last 3 Encounters:  04/05/22 156 lb 6.4 oz (70.9 kg)  04/08/21 156 lb 14.2 oz (71.2 kg)  01/13/21 158 lb 6.4 oz (71.8 kg)     GEN: Well nourished, well developed, in no acute distress. HEENT: normal. Neck: Supple, no JVD, carotid bruits, or masses. Cardiac: RRR, no murmurs, rubs, or gallops. No clubbing, cyanosis, trace LE bilateral edema.  Radials/PT 2+ and equal bilaterally.  Respiratory:  Respirations regular and unlabored, clear to auscultation bilaterally. GI: Soft, nontender, nondistended. MS: No deformity or atrophy. Skin: Warm and dry, no rash. Neuro:   Strength and sensation are intact. Psych: Normal affect.  Assessment & Plan    1. Atrial fibrillation, paroxysmal/PVCs/SVT: No palpitations.  She is in normal sinus rhythm today. Continue Cardizem, Bystolic and Eliquis.        2.CAD without angina: Cath July 2017 with mild CAD. No chest pain. Continue statin and beta  blocker. No ASA since she is on Eliquis.     3. Chronic diastolic CHF: Weight stable.  Trace LE edema.  She has not experienced any lower extremity edema.  Continue Lasix prn. Encouraged low-sodium diet.    4. Hyperlipidemia: Lipids followed in primary care. LDL 73.  Her zetia was stopped by her PCP due to dizziness.  We have switched her statin to Crestor 20 mg at bedtime.  Goal LDL < 70    Disposition: Follow up 1 year with Lauree Chandler, MD or APP.  Signed, Elgie Collard, PA-C 04/05/2022, 1:24 PM [facility]

## 2022-04-05 ENCOUNTER — Encounter: Payer: Self-pay | Admitting: Physician Assistant

## 2022-04-05 ENCOUNTER — Ambulatory Visit: Payer: Medicare Other | Attending: Physician Assistant | Admitting: Physician Assistant

## 2022-04-05 VITALS — BP 130/82 | HR 51 | Ht 70.0 in | Wt 156.4 lb

## 2022-04-05 DIAGNOSIS — I5022 Chronic systolic (congestive) heart failure: Secondary | ICD-10-CM

## 2022-04-05 DIAGNOSIS — E785 Hyperlipidemia, unspecified: Secondary | ICD-10-CM

## 2022-04-05 DIAGNOSIS — I2510 Atherosclerotic heart disease of native coronary artery without angina pectoris: Secondary | ICD-10-CM | POA: Diagnosis not present

## 2022-04-05 DIAGNOSIS — I480 Paroxysmal atrial fibrillation: Secondary | ICD-10-CM

## 2022-04-05 NOTE — Patient Instructions (Signed)
Medication Instructions:  Your physician recommends that you continue on your current medications as directed. Please refer to the Current Medication list given to you today.  *If you need a refill on your cardiac medications before your next appointment, please call your pharmacy*   Lab Work: None ordered If you have labs (blood work) drawn today and your tests are completely normal, you will receive your results only by: [place] (if you have MyChart) OR A paper copy in the mail If you have any lab test that is abnormal or we need to change your treatment, we will call you to review the results.   Follow-Up: At [facility], you and your health needs are our priority.  As part of our continuing mission to provide you with exceptional heart care, we have created designated Provider Care Teams.  These Care Teams include your primary Cardiologist (physician) and Advanced Practice Providers (APPs -  Physician Assistants and Nurse Practitioners) who all work together to provide you with the care you need, when you need it.   Your next appointment:   1 year(s)  Provider:   Lauree Chandler, MD

## 2022-04-09 DIAGNOSIS — H353211 Exudative age-related macular degeneration, right eye, with active choroidal neovascularization: Secondary | ICD-10-CM | POA: Diagnosis not present

## 2022-04-09 DIAGNOSIS — H353221 Exudative age-related macular degeneration, left eye, with active choroidal neovascularization: Secondary | ICD-10-CM | POA: Diagnosis not present

## 2022-05-03 ENCOUNTER — Other Ambulatory Visit: Payer: Self-pay | Admitting: Physician Assistant

## 2022-05-04 DIAGNOSIS — I480 Paroxysmal atrial fibrillation: Secondary | ICD-10-CM | POA: Diagnosis not present

## 2022-05-04 DIAGNOSIS — E785 Hyperlipidemia, unspecified: Secondary | ICD-10-CM | POA: Diagnosis not present

## 2022-05-04 DIAGNOSIS — D6869 Other thrombophilia: Secondary | ICD-10-CM | POA: Diagnosis not present

## 2022-05-04 DIAGNOSIS — E039 Hypothyroidism, unspecified: Secondary | ICD-10-CM | POA: Diagnosis not present

## 2022-05-04 DIAGNOSIS — F03A0 Unspecified dementia, mild, without behavioral disturbance, psychotic disturbance, mood disturbance, and anxiety: Secondary | ICD-10-CM | POA: Diagnosis not present

## 2022-05-04 DIAGNOSIS — I10 Essential (primary) hypertension: Secondary | ICD-10-CM | POA: Diagnosis not present

## 2022-05-24 ENCOUNTER — Other Ambulatory Visit: Payer: Self-pay | Admitting: Cardiovascular Disease

## 2022-05-24 DIAGNOSIS — I480 Paroxysmal atrial fibrillation: Secondary | ICD-10-CM

## 2022-05-24 NOTE — Telephone Encounter (Signed)
Prescription refill request for Eliquis received. Indication: afib Last office visit:04/05/22 Scr:1 05/04/22 Age: 82 Weight:70kg

## 2022-07-06 ENCOUNTER — Other Ambulatory Visit: Payer: Self-pay

## 2022-07-13 DIAGNOSIS — R413 Other amnesia: Secondary | ICD-10-CM | POA: Insufficient documentation

## 2022-07-13 DIAGNOSIS — R630 Anorexia: Secondary | ICD-10-CM | POA: Insufficient documentation

## 2022-07-13 DIAGNOSIS — I700 Atherosclerosis of aorta: Secondary | ICD-10-CM | POA: Insufficient documentation

## 2022-07-15 NOTE — Progress Notes (Signed)
Celso Amy, PA-C [address]  [address]  [place], [place] [address]  Main: [phone]  Fax: [phone]   Gastroenterology Consultation  Referring Provider:     Rodrigo Ran, MD Primary Care Physician:  Rodrigo Ran, MD Primary Gastroenterologist:  Celso Amy, PA-C / Dr. Wyline Mood   Reason for Consultation:     GERD, Change in Bowel Habits, Anorexia        HPI:   Christina [name] is a 82 y.o. y/o female referred for consultation & management  by Rodrigo Ran, MD.    Here to evaluate Irregular Bowel Habits.  Chronic GI symptom x years.  She has constipation with no bowel movement for several days.  Typically has soft mushy bowel movement every 2 or 3 days.  She denies hard stools or straining.  She does have to use manual disimpaction to have a bowel movement.  She feels full and bloated.  Feeling of incomplete bowel evacuation.  She denies rectal bleeding or abdominal pain.  She tried a stool softener which increased diarrhea.  Also tried Metamucil and Citrucel which did not work well.  She has not taken any fiber supplement or stool softener in the past 3 weeks.  Currently taking omeprazole 20 mg once daily with excellent control of acid reflux.  Denies breakthrough GERd or dysphagia.  She saw Dr. Adela Lank at [facility] 06/2019 for change in bowel habits, urgent Bms, Loose Bms, incomplete BM.  Episodes of fecal incontinence.  Loose and small balls of stool.  Tried fiber supplement and stool soft with no benefit.  No fam hx IBD, celiac, or colon cancer.  Took Omeprazole 20mg  QD for GERD.  Colonoscopy 2014 - Dr. Randa Evens - No polyps, Normal, Good Prep.  Colonoscopy 06/2019 - Dr. Adela Lank - 5 small adenomatous Polyps removed:  6mm pedunculated polyp sigmoid; Two 3mm & 6mm polyps ascending colon, 3mm polyp transver colon, dimunitive polyp cecum.  Biopsies: Negative for Microscopic Colitis.  06/2019 - Negative Celiac Labs.  NO EGD.  Hx of  A-Fib (On Eliquis), Hx  thyroid cancer, Breast cancer, melanoma & diverticulitis in the past.  Hx rectal sphincter surgery, cystocele mesh repair, and hysterectomy.  Mild dementia.  12/2021 - Abd / Pelvic CT with contrast, to eval low abd pain / diarrhea / nausea - showed No acute abnormality.  Benign liver cyst, Moderate stool in colon, mild stable chronic stomach thickening.    Past Medical History:  Diagnosis Date   Atypical chest pain    a. 08/2015 Cath: LM nl, LAD 20ost, D1 small/nl, D2 mod, nl, RI small, nl, LCX nl, OM1 small, nl, OM2 mod nl, RCA 19m, EF 55-65%.   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 CHA2DS2VASc = 3-->Xarelto since 2015;  b. 08/2015 48h holter in setting of palpitations-->PVC's.   Palpitations    a. 08/2015 48h Holter: pvc's.   Papillary carcinoma (HCC)    Post-menopausal    PVCs (premature ventricular contractions)    Shingles  Thyroid cancer [facility])    s/p THYROIDECTOMY   Whooping cough    as a child    Past Surgical History:  Procedure Laterality Date    tarsal tunnel release  2002   Right/Left  tarsal tunnel release   CARDIAC CATHETERIZATION  04/21/07   GLOBAL ESTIMATED EF IS 60 %; NORMAL LV FUNCTION; NORMAL CORONARY ARTERIES; BORDERLINE INCREASE IN THE AORTIC ROOT   CARDIAC CATHETERIZATION N/A 09/18/2015   Procedure: Left Heart Cath and Coronary Angiography;  Surgeon: Kathleene Hazel, MD;  Location: [facility] INVASIVE CV LAB;  Service: Cardiovascular;  Laterality: N/A;   COLONOSCOPY     CYSTOSCOPY  2007   anterior repair of cystocele with mesh   IR ANGIOGRAM EXTREMITY LEFT  11/05/2019   IR RADIOLOGIST EVAL &  MGMT  11/01/2019   IR US GUIDE VASC ACCESS LEFT  11/05/2019   MASTECTOMY  2000   LEFT BREAST   MELANOMA EXCISION  1991   REMOVED FROM LEFT LEG   NASAL SINUS SURGERY     THYROIDECTOMY      Prior to Admission medications   Medication Sig Start Date End Date Taking? Authorizing Provider  acetaminophen (TYLENOL) 500 MG tablet Take 500-1,000 mg by mouth daily as needed for mild pain or headache.    [provider]  apixaban (ELIQUIS) 5 MG TABS tablet TAKE ONE TABLET BY MOUTH TWICE A DAY 05/24/22   Kathleene Hazel, MD  calcium citrate-vitamin D (CITRACAL+D) 315-200 MG-UNIT tablet Take 2 tablets by mouth 2 (two) times daily.    [provider]  cetirizine (ZYRTEC) 10 MG tablet Take 10 mg by mouth as needed for allergies.    [provider]  diltiazem (CARDIZEM CD) 120 MG 24 hr capsule TAKE ONE CAPSULE BY MOUTH DAILY 09/22/16   Kathleene Hazel, MD  fluticasone [facility]) 50 MCG/ACT nasal spray 1 spray in each nostril 06/27/17   [provider]  Fluticasone Propionate, Inhal, 50 MCG/ACT AEPB Inhale 2 puffs into the lungs as needed.    [provider]  galantamine (RAZADYNE ER) 24 MG 24 hr capsule Take 24 mg by mouth daily with breakfast.    [provider]  Glucosamine-Chondroitin (OSTEO BI-FLEX REGULAR STRENGTH PO) Take 2 tablets by mouth daily after supper.    [provider]  HYDROcodone-acetaminophen (NORCO) 5-325 MG tablet Take 1 tablet by mouth every 6 (six) hours as needed for moderate pain. 12/15/20   Candelaria Stagers, DPM  ibuprofen (ADVIL) 800 MG tablet Take 1 tablet (800 mg total) by mouth every 6 (six) hours as needed. 12/15/20   Candelaria Stagers, DPM  levothyroxine (SYNTHROID, LEVOTHROID) 150 MCG tablet Take 150 mcg by mouth See admin instructions. Take 150 mcg before breakfast Monday through Saturday. Skip dose on Sundays    [provider]  memantine (NAMENDA) 5 MG tablet Take 5 mg by mouth 2 (two) times daily.  11/18/20   [provider]  Misc Natural Products (OSTEO BI-FLEX ADV TRIPLE ST) TABS Take 2 tablets by mouth at bedtime.    [provider]  Multiple Vitamin (MULTI-VITAMIN DAILY PO) Take 1 tablet by mouth daily. 03/13/09   [provider]  Multiple Vitamins-Minerals (PRESERVISION AREDS) CAPS Take 1 capsule by mouth 2 (two) times daily.     [provider]  nebivolol (BYSTOLIC) 5 MG tablet Take 2.5 mg by mouth at bedtime.    [provider]  Omega-3 Fatty Acids (FISH OIL) 1200 MG CAPS Take 2,400 mg by mouth  daily.    [provider]  omeprazole (PRILOSEC) 20 MG capsule Take 20 mg by mouth daily.    [provider]  oxyCODONE-acetaminophen (PERCOCET) 10-325 MG tablet Take 1 tablet by mouth every 4 (four) hours as needed for pain. 11/12/19   Candelaria Stagers, DPM  potassium chloride SA (K-DUR,KLOR-CON) 20 MEQ tablet Take 40 mEq by mouth daily.     [provider]  rosuvastatin (CRESTOR) 40 MG tablet TAKE ONE TABLET BY MOUTH ONCE A DAY 05/03/22   Kathleene Hazel, MD  triamterene-hydrochlorothiazide (MAXZIDE-25) 37.5-25 MG tablet Take 1 tablet by mouth daily.    [provider]    Family History  Problem Relation Age of Onset   Pancreatitis Mother    Heart disease Mother    Kidney disease Mother    Hypertension Father    Macular degeneration Father    Heart disease Father    Breast cancer Neg Hx    Colon cancer Neg Hx    Esophageal cancer Neg Hx    Pancreatic cancer Neg Hx    Stomach cancer Neg Hx      Social History   Tobacco Use   Smoking status: Never   Smokeless tobacco: Never  Vaping Use   Vaping Use: Never used  Substance Use Topics   Alcohol use: Yes    Comment: RARE   Drug use: No    Allergies as of 07/16/2022 - Review Complete 04/05/2022  Allergen Reaction Noted   Carbamazepine Hives, Rash, and Other (See Comments) 08/03/2010   Oxaprozin Hives, Rash, and Other (See Comments) 08/03/2010    Amoxicillin-pot clavulanate Diarrhea and Nausea Only 09/06/2017   Ciprofibrate Other (See Comments) 12/28/2020   Miralax [polyethylene glycol] Other (See Comments) 12/28/2020   Oxycodone-acetaminophen  11/30/2019   Pedi-pre tape spray [wound dressing adhesive] Other (See Comments) 12/28/2020   Prednisolone  09/30/2016   Ramipril Other (See Comments) 03/13/2009   Prednisone Itching and Other (See Comments) 03/29/2013    Review of Systems:    All systems reviewed and negative except where noted in HPI.   Physical Exam:  There were no vitals taken for this visit. No LMP recorded. Patient has had a hysterectomy. Psych:  Alert and cooperative. Normal mood and affect. General:   Alert,  Well-developed, well-nourished, pleasant and cooperative in NAD Head:  Normocephalic and atraumatic. Eyes:  Sclera clear, no icterus.   Conjunctiva pink. Neck:  Supple; no masses or thyromegaly. Lungs:  Respirations even and unlabored.  Clear throughout to auscultation.   No wheezes, crackles, or rhonchi. No acute distress. Heart:  Regular rate and rhythm; no murmurs, clicks, rubs, or gallops. Abdomen:  Normal bowel sounds.  No bruits.  Soft, and non-distended without masses, hepatosplenomegaly or hernias noted.  No Tenderness.  No guarding or rebound tenderness.    Neurologic:  Alert and oriented x3;  grossly normal neurologically. Psych:  Alert and cooperative. Normal mood and affect.  Imaging Studies: No results found.  Assessment and Plan:   Christina [name] is a 82 y.o. y/o female has been referred for Irregular Bowel Habits.  Mostly constipation with occasional loose stools.  CT abdomen pelvis 12/2021 showed moderate stool throughout the colon with no acute abnormality.  Colonoscopy 06/2019 showed 5 small adenomatous polyps removed.  No repeat colonoscopy was recommended due to advanced age.  GERD is controlled on low-dose omeprazole 20 Mg daily.  No alarm symptoms.  Irregular Bowel Habits -  Constipation & Loose stools.  Mostly Constipation.  Start Senokot 8.6 mg 2 tablets once daily for a few weeks.   If no benefit, then increase Senokot to 2 tablets twice daily.   Remain off Colace stool softener and Metamucil for now.   If symptoms persist, then I will try her on low-dose Linzess 72 mcg daily.  2.  GERD - Controlled  Continue omeprazole 20 mg once daily.  Continue avoiding GERD trigger foods and drinks.  3.   History of Colon Polyps - 5 small adenomatous polyps removed during Colonoscopy 06/2022.  Dr. Adela Lank at [facility] recommended no further colonoscopies due to advanced age.  Follow up in 4 weeks with TG  Celso Amy, PA-C

## 2022-07-16 ENCOUNTER — Ambulatory Visit: Payer: Medicare Other | Admitting: Physician Assistant

## 2022-07-16 ENCOUNTER — Encounter: Payer: Self-pay | Admitting: Physician Assistant

## 2022-07-16 VITALS — BP 139/77 | HR 59 | Temp 97.8°F | Ht 70.0 in | Wt 159.4 lb

## 2022-07-16 DIAGNOSIS — K219 Gastro-esophageal reflux disease without esophagitis: Secondary | ICD-10-CM | POA: Diagnosis not present

## 2022-07-16 DIAGNOSIS — Z860100 Personal history of colon polyps, unspecified: Secondary | ICD-10-CM

## 2022-07-16 DIAGNOSIS — R198 Other specified symptoms and signs involving the digestive system and abdomen: Secondary | ICD-10-CM

## 2022-07-16 DIAGNOSIS — K5901 Slow transit constipation: Secondary | ICD-10-CM

## 2022-07-16 NOTE — Patient Instructions (Signed)

## 2022-07-22 ENCOUNTER — Other Ambulatory Visit: Payer: Self-pay

## 2022-07-22 ENCOUNTER — Ambulatory Visit
Admission: RE | Admit: 2022-07-22 | Discharge: 2022-07-22 | Disposition: A | Discharge status: Home / Self Care | Payer: Medicare Other | Source: Ambulatory Visit | Attending: Physician Assistant | Admitting: Physician Assistant

## 2022-07-22 DIAGNOSIS — R1084 Generalized abdominal pain: Secondary | ICD-10-CM

## 2022-07-22 DIAGNOSIS — K5901 Slow transit constipation: Secondary | ICD-10-CM | POA: Diagnosis not present

## 2022-07-26 ENCOUNTER — Telehealth: Payer: Self-pay

## 2022-07-26 NOTE — Progress Notes (Signed)
Please call and notify patient and her husband the abdominal x-ray shows no bowel obstruction.  However, there is moderate stool in the cecum and ascending colon.  This is concerning for constipation.  How is patient doing?  Please take OTC Senokot if she is constipated.

## 2022-07-26 NOTE — Telephone Encounter (Signed)
Please call and notify patient and her husband the abdominal x-ray shows no bowel obstruction.  However, there is moderate stool in the cecum and ascending colon.  This is concerning for constipation.  How is patient doing?  Please take OTC Senokot if she is constipated.   Patient stated she is taking Senokot and its not helping. Its to watery or to soft. We discussed taking it every other day to see if will help. Patient will call back end of week and let us know it his helps.

## 2022-07-27 DIAGNOSIS — H353211 Exudative age-related macular degeneration, right eye, with active choroidal neovascularization: Secondary | ICD-10-CM | POA: Diagnosis not present

## 2022-08-15 NOTE — Progress Notes (Deleted)
Celso Amy, PA-C [address]  [address]  [place], [place] [address]  Main: [phone]  Fax: [phone]   Primary Care Physician: Rodrigo Ran, MD  Primary Gastroenterologist:  ***  No chief complaint on file.   HPI: Christina [name] is a 82 y.o. female returns for 1 month follow-up of irregular bowel habits, constipation predominant.  1 month ago she was started on Senokot 8.6 mg 2 tablets once daily.  Colace and Metamucil were discontinued due to adverse side effects.  Senokot caused diarrhea and was discontinued.  Abdominal x-ray 07/26/2022 showed stool in the cecum and ascending colon.  No obstruction.  Current symptoms  Colonoscopy 06/2019 - Dr. Adela Lank - 5 small tubular adenoma Polyps removed:  6mm pedunculated polyp sigmoid; Two 3mm & 6mm polyps ascending colon, 3mm polyp transver colon, dimunitive polyp cecum.  Biopsies: Negative for Microscopic Colitis.   06/2019 - Negative Celiac Labs.  NO EGD.   Hx of  A-Fib (On Eliquis), Hx thyroid cancer, Breast cancer, melanoma & diverticulitis in the past.  Hx rectal sphincter surgery, cystocele mesh repair, and hysterectomy.  Mild dementia.   12/2021 - Abd / Pelvic CT with contrast, to eval low abd pain / diarrhea / nausea - showed No acute abnormality.  Benign liver cyst, Moderate stool in colon, mild stable chronic stomach thickening.  Current Outpatient Medications  Medication Sig Dispense Refill   acetaminophen (TYLENOL) 500 MG tablet Take 500-1,000 mg by mouth daily as needed for mild pain or headache.     apixaban (ELIQUIS) 5 MG TABS tablet TAKE ONE TABLET BY MOUTH TWICE A DAY 60 tablet 5   calcium citrate-vitamin D (CITRACAL+D) 315-200 MG-UNIT tablet Take 2 tablets by mouth 2 (two) times daily.     cetirizine (ZYRTEC) 10 MG tablet Take 10 mg by mouth as needed for allergies.     diltiazem (CARDIZEM CD) 120 MG 24 hr capsule TAKE ONE CAPSULE BY MOUTH DAILY 90 capsule 1   fluticasone (FLONASE) 50 MCG/ACT  nasal spray 1 spray in each nostril     Fluticasone Propionate, Inhal, 50 MCG/ACT AEPB Inhale 2 puffs into the lungs as needed.     galantamine (RAZADYNE ER) 24 MG 24 hr capsule Take 24 mg by mouth daily with breakfast.     Glucosamine-Chondroitin (OSTEO BI-FLEX REGULAR STRENGTH PO) Take 2 tablets by mouth daily after supper.     HYDROcodone-acetaminophen (NORCO) 5-325 MG tablet Take 1 tablet by mouth every 6 (six) hours as needed for moderate pain. 30 tablet 0   ibuprofen (ADVIL) 800 MG tablet Take 1 tablet (800 mg total) by mouth every 6 (six) hours as needed. 60 tablet 1   levothyroxine (SYNTHROID, LEVOTHROID) 150 MCG tablet Take 150 mcg by mouth See admin instructions. Take 150 mcg before breakfast Monday through Saturday. Skip dose on Sundays     memantine (NAMENDA) 5 MG tablet Take 5 mg by mouth 2 (two) times daily.     Misc Natural Products (OSTEO BI-FLEX ADV TRIPLE ST) TABS Take 2 tablets by mouth at bedtime.     Multiple Vitamin (MULTI-VITAMIN DAILY PO) Take 1 tablet by mouth daily.     Multiple Vitamins-Minerals (PRESERVISION AREDS) CAPS Take 1 capsule by mouth 2 (two) times daily.      nebivolol (BYSTOLIC) 5 MG tablet Take 2.5 mg by mouth at bedtime.     Omega-3 Fatty Acids (FISH OIL) 1200 MG CAPS Take 2,400 mg by mouth daily.     omeprazole (PRILOSEC) 20 MG  capsule Take 20 mg by mouth daily.     oxyCODONE-acetaminophen (PERCOCET) 10-325 MG tablet Take 1 tablet by mouth every 4 (four) hours as needed for pain. 30 tablet 0   potassium chloride SA (K-DUR,KLOR-CON) 20 MEQ tablet Take 40 mEq by mouth daily.      rosuvastatin (CRESTOR) 40 MG tablet TAKE ONE TABLET BY MOUTH ONCE A DAY 90 tablet 3   triamterene-hydrochlorothiazide (MAXZIDE-25) 37.5-25 MG tablet Take 1 tablet by mouth daily.     No current facility-administered medications for this visit.    Allergies as of 08/16/2022 - Review Complete 07/16/2022  Allergen Reaction Noted   Carbamazepine Hives, Rash, and Other (See  Comments) 08/03/2010   Oxaprozin Hives, Rash, and Other (See Comments) 08/03/2010   Amoxicillin-pot clavulanate Diarrhea and Nausea Only 09/06/2017   Ciprofibrate Other (See Comments) 12/28/2020   Miralax [polyethylene glycol] Other (See Comments) 12/28/2020   Oxycodone-acetaminophen  11/30/2019   Pedi-pre tape spray [wound dressing adhesive] Other (See Comments) 12/28/2020   Prednisolone  09/30/2016   Ramipril Other (See Comments) 03/13/2009   Prednisone Itching and Other (See Comments) 03/29/2013    Past Medical History:  Diagnosis Date   Atypical chest pain    a. 08/2015 Cath: LM nl, LAD 20ost, D1 small/nl, D2 mod, nl, RI small, nl, LCX nl, OM1 small, nl, OM2 mod nl, RCA 7m, EF 55-65%.   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 CHA2DS2VASc = 3-->Xarelto since 2015;  b. 08/2015 48h holter in setting of palpitations-->PVC's.   Palpitations    a. 08/2015 48h Holter: pvc's.   Papillary carcinoma (HCC)    Post-menopausal    PVCs (premature ventricular contractions)    Shingles    Thyroid cancer (HCC)    s/p THYROIDECTOMY   Whooping cough    as a child    Past Surgical History:  Procedure Laterality Date    tarsal tunnel release  2002   Right/Left  tarsal tunnel release   CARDIAC CATHETERIZATION  04/21/07   GLOBAL ESTIMATED EF IS 60 %; NORMAL LV FUNCTION; NORMAL CORONARY ARTERIES; BORDERLINE INCREASE IN THE AORTIC ROOT   CARDIAC CATHETERIZATION N/A 09/18/2015   Procedure: Left Heart Cath and Coronary Angiography;  Surgeon: Kathleene Hazel, MD;  Location: [facility] INVASIVE CV LAB;  Service: Cardiovascular;  Laterality:  N/A;   COLONOSCOPY     CYSTOSCOPY  2007   anterior repair of cystocele with mesh   IR ANGIOGRAM EXTREMITY LEFT  11/05/2019   IR RADIOLOGIST EVAL & MGMT  11/01/2019   IR US GUIDE VASC ACCESS LEFT  11/05/2019   MASTECTOMY  2000   LEFT BREAST   MELANOMA EXCISION  1991   REMOVED FROM LEFT LEG   NASAL SINUS SURGERY     THYROIDECTOMY      Review of Systems:    All systems reviewed and negative except where noted in HPI.   Physical Examination:   There were no vitals taken for this visit.  General: Well-nourished, well-developed in no acute distress.  Eyes: No icterus. Conjunctivae pink. Mouth: Oropharyngeal mucosa moist and pink , no lesions erythema or exudate. Lungs: Clear to auscultation bilaterally. Non-labored. Heart: Regular rate and rhythm, no murmurs rubs or gallops.  Abdomen: Bowel sounds are normal; Abdomen is Soft; No hepatosplenomegaly, masses or hernias;  No Abdominal Tenderness; No guarding or rebound tenderness. Extremities: No lower extremity edema. No clubbing or deformities. Neuro: Alert and oriented x 3.  Grossly intact. Skin: Warm and dry, no jaundice.   Psych: Alert and cooperative, normal mood and affect.   Imaging Studies: DG Abd 1 View  Result Date: 07/26/2022 CLINICAL DATA:  Diarrhea and generalized abdominal pain EXAM: ABDOMEN - 1 VIEW COMPARISON:  CT abdomen pelvis 01/06/2022 FINDINGS: Lung bases are clear. Surgical clips project over the left hemiabdomen. Swollen stool within the cecum and ascending colon. Nonobstructive bowel gas pattern. No free intraperitoneal air. Lumbar spine degenerative changes. IMPRESSION: Nonobstructive bowel gas pattern. Electronically Signed   By: Annia Belt M.D.   On: 07/26/2022 14:35    Assessment and Plan:   Avah Smethurst is a 82 y.o. y/o female returns for 1 month follow-up of irregular bowel habits.  Mostly constipation with occasional loose stool.  CT abdomen pelvis 12/2021 showed moderate stool throughout the  colon with no acute abnormality.  Colonoscopy 06/2019 showed 5 small adenomatous polyps removed.  No repeat colonoscopy was recommended due to advanced age.  GERD is controlled on low-dose omeprazole 20 Mg daily.  No alarm symptoms.   Irregular Bowel Habits - Constipation & Loose stools.  Mostly Constipation.             Start Senokot 8.6 mg 2 tablets once daily for a few weeks.   If no benefit, then increase Senokot to 2 tablets twice daily.   Remain off Colace stool softener and Metamucil for now.   If symptoms persist, then I will try her on low-dose Linzess 72 mcg daily.   2.  GERD - Controlled             Continue omeprazole 20 mg once daily.             Continue avoiding GERD trigger foods and drinks.   3.   History of Colon Polyps - 5 small adenomatous polyps removed during Colonoscopy 06/2022.             Dr. Adela Lank at [facility] recommended no further colonoscopies due to advanced age.    Celso Amy, PA-C  Follow up in ***  BP check ***

## 2022-08-16 ENCOUNTER — Encounter: Payer: Self-pay | Admitting: Physician Assistant

## 2022-08-16 ENCOUNTER — Ambulatory Visit: Payer: Medicare Other | Admitting: Physician Assistant

## 2022-09-28 DIAGNOSIS — K080 Exfoliation of teeth due to systemic causes: Secondary | ICD-10-CM | POA: Diagnosis not present

## 2022-11-02 DIAGNOSIS — L821 Other seborrheic keratosis: Secondary | ICD-10-CM | POA: Diagnosis not present

## 2022-11-02 DIAGNOSIS — D2261 Melanocytic nevi of right upper limb, including shoulder: Secondary | ICD-10-CM | POA: Diagnosis not present

## 2022-11-02 DIAGNOSIS — Z85828 Personal history of other malignant neoplasm of skin: Secondary | ICD-10-CM | POA: Diagnosis not present

## 2022-11-02 DIAGNOSIS — Z85820 Personal history of malignant melanoma of skin: Secondary | ICD-10-CM | POA: Diagnosis not present

## 2022-11-02 DIAGNOSIS — H353211 Exudative age-related macular degeneration, right eye, with active choroidal neovascularization: Secondary | ICD-10-CM | POA: Diagnosis not present

## 2022-11-23 ENCOUNTER — Telehealth: Payer: Self-pay

## 2022-11-23 NOTE — Progress Notes (Unsigned)
Celso Amy, PA-C [address]  [address]  [place], [place] [address]  Main: [phone]  Fax: [phone]   Primary Care Physician: Rodrigo Ran, MD  Primary Gastroenterologist:  Celso Amy, PA-C   CC: F/U Constipation  HPI: Meeghan Skipper is a 82 y.o. female returns for 71-month follow-up of irregular BMs and chronic constipation.  Last seen here 07/16/2022.  Abdominal x-ray showed stool in the cecum and ascending colon, consistent with constipation.  No obstruction.  She was started on Senokot 8.6 Mg 2 tablets once daily.  This caused diarrhea and she discontinued.  In the past few months she has tried Citrucel, MiraLAX, and stool softeners with no benefit.  She has stool which gets stuck in the rectum.  She has to use manual disimpaction.  Stools are typically soft and not hard.  She has difficulty pushing the bowel movement out.  Denies rectal bleeding, abdominal pain, or unintentional weight loss.  She is currently taking omeprazole 20 Mg once daily with good control of acid reflux.   GI history: She saw Dr. Adela Lank at [facility] 06/2019 for change in bowel habits, urgent Bms, Loose Bms, incomplete BM.  Episodes of fecal incontinence.  Loose and small balls of stool.  Tried fiber supplement and stool soft with no benefit.  No fam hx IBD, celiac, or colon cancer.  Took Omeprazole 20mg  QD for GERD.   Colonoscopy 2014 - Dr. Randa Evens - No polyps, Normal, Good Prep.   Colonoscopy 06/2019 - Dr. Adela Lank - 5 small adenomatous Polyps removed:  6mm pedunculated polyp sigmoid; Two 3mm & 6mm polyps ascending colon, 3mm polyp transver colon, dimunitive polyp cecum.  Biopsies: Negative for Microscopic Colitis.   06/2019 - Negative Celiac Labs.  NO EGD.   Hx of  A-Fib (On Eliquis), Hx thyroid cancer, Breast cancer, melanoma & diverticulitis in the past.  Hx rectal sphincter surgery, cystocele mesh repair, and hysterectomy.  Mild dementia.   12/2021 - Abd / Pelvic CT with  contrast, to eval low abd pain / diarrhea / nausea - showed No acute abnormality.  Benign liver cyst, Moderate stool in colon, mild stable chronic stomach thickening.  Current Outpatient Medications  Medication Sig Dispense Refill   acetaminophen (TYLENOL) 500 MG tablet Take 500-1,000 mg by mouth daily as needed for mild pain or headache.     apixaban (ELIQUIS) 5 MG TABS tablet TAKE ONE TABLET BY MOUTH TWICE A DAY 60 tablet 5   calcium citrate-vitamin D (CITRACAL+D) 315-200 MG-UNIT tablet Take 2 tablets by mouth 2 (two) times daily.     cetirizine (ZYRTEC) 10 MG tablet Take 10 mg by mouth as needed for allergies.     diltiazem (CARDIZEM CD) 120 MG 24 hr capsule TAKE ONE CAPSULE BY MOUTH DAILY 90 capsule 1   fluticasone (FLONASE) 50 MCG/ACT nasal spray 1 spray in each nostril     Fluticasone Propionate, Inhal, 50 MCG/ACT AEPB Inhale 2 puffs into the lungs as needed.     galantamine (RAZADYNE ER) 24 MG 24 hr capsule Take 24 mg by mouth daily with breakfast.     Glucosamine-Chondroitin (OSTEO BI-FLEX REGULAR STRENGTH PO) Take 2 tablets by mouth daily after supper.     HYDROcodone-acetaminophen (NORCO) 5-325 MG tablet Take 1 tablet by mouth every 6 (six) hours as needed for moderate pain. 30 tablet 0   ibuprofen (ADVIL) 800 MG tablet Take 1 tablet (800 mg total) by mouth every 6 (six) hours as needed. 60 tablet 1  levothyroxine (SYNTHROID, LEVOTHROID) 150 MCG tablet Take 150 mcg by mouth See admin instructions. Take 150 mcg before breakfast Monday through Saturday. Skip dose on Sundays     linaclotide (LINZESS) 145 MCG CAPS capsule Take 1 capsule (145 mcg total) by mouth daily before breakfast for 4 days. 4 capsule 0   linaclotide (LINZESS) 72 MCG capsule Take 1 capsule (72 mcg total) by mouth daily before breakfast for 8 days. 8 capsule    memantine (NAMENDA) 5 MG tablet Take 5 mg by mouth 2 (two) times daily.     Misc Natural Products (OSTEO BI-FLEX ADV TRIPLE ST) TABS Take 2 tablets by mouth at  bedtime.     Multiple Vitamin (MULTI-VITAMIN DAILY PO) Take 1 tablet by mouth daily.     Multiple Vitamins-Minerals (PRESERVISION AREDS) CAPS Take 1 capsule by mouth 2 (two) times daily.      nebivolol (BYSTOLIC) 5 MG tablet Take 2.5 mg by mouth at bedtime.     Omega-3 Fatty Acids (FISH OIL) 1200 MG CAPS Take 2,400 mg by mouth daily.     omeprazole (PRILOSEC) 20 MG capsule Take 20 mg by mouth daily.     potassium chloride SA (K-DUR,KLOR-CON) 20 MEQ tablet Take 40 mEq by mouth daily.      rosuvastatin (CRESTOR) 40 MG tablet TAKE ONE TABLET BY MOUTH ONCE A DAY 90 tablet 3   triamterene-hydrochlorothiazide (MAXZIDE-25) 37.5-25 MG tablet Take 1 tablet by mouth daily.     oxyCODONE-acetaminophen (PERCOCET) 10-325 MG tablet Take 1 tablet by mouth every 4 (four) hours as needed for pain. (Patient not taking: Reported on 11/24/2022) 30 tablet 0   No current facility-administered medications for this visit.    Allergies as of 11/24/2022 - Review Complete 11/24/2022  Allergen Reaction Noted   Carbamazepine Hives, Rash, and Other (See Comments) 08/03/2010   Oxaprozin Hives, Rash, and Other (See Comments) 08/03/2010   Amoxicillin-pot clavulanate Diarrhea and Nausea Only 09/06/2017   Ciprofibrate Other (See Comments) 12/28/2020   Miralax [polyethylene glycol] Other (See Comments) 12/28/2020   Oxycodone-acetaminophen  11/30/2019   Pedi-pre tape spray [wound dressing adhesive] Other (See Comments) 12/28/2020   Prednisolone  09/30/2016   Ramipril Other (See Comments) 03/13/2009   Prednisone Itching and Other (See Comments) 03/29/2013    Past Medical History:  Diagnosis Date   Atypical chest pain    a. 08/2015 Cath: LM nl, LAD 20ost, D1 small/nl, D2 mod, nl, RI small, nl, LCX nl, OM1 small, nl, OM2 mod nl, RCA 57m, EF 55-65%.   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 CHA2DS2VASc = 3-->Xarelto since 2015;  b. 08/2015 48h holter in setting of palpitations-->PVC's.   Palpitations    a. 08/2015 48h Holter: pvc's.   Papillary carcinoma (HCC)    Post-menopausal    PVCs (premature ventricular contractions)    Shingles    Thyroid cancer (HCC)    s/p THYROIDECTOMY   Whooping cough    as a child    Past Surgical History:  Procedure  Laterality Date    tarsal tunnel release  2002   Right/Left  tarsal tunnel release   CARDIAC CATHETERIZATION  04/21/07   GLOBAL ESTIMATED EF IS 60 %; NORMAL LV FUNCTION; NORMAL CORONARY ARTERIES; BORDERLINE INCREASE IN THE AORTIC ROOT   CARDIAC CATHETERIZATION N/A 09/18/2015   Procedure: Left Heart Cath and Coronary Angiography;  Surgeon: Kathleene Hazel, MD;  Location: [facility] INVASIVE CV LAB;  Service: Cardiovascular;  Laterality: N/A;   COLONOSCOPY     CYSTOSCOPY  2007   anterior repair of cystocele with mesh   IR ANGIOGRAM EXTREMITY LEFT  11/05/2019   IR RADIOLOGIST EVAL & MGMT  11/01/2019   IR US GUIDE VASC ACCESS LEFT  11/05/2019   MASTECTOMY  2000   LEFT BREAST   MELANOMA EXCISION  1991   REMOVED FROM LEFT LEG   NASAL SINUS SURGERY     THYROIDECTOMY      Review of Systems:    All systems reviewed and negative except where noted in HPI.   Physical Examination:   BP 114/70 (BP Location: Right Arm, Patient Position: Sitting, Cuff Size: Small)   Pulse 63   Temp (!) 97.4 F (36.3 C) (Oral)   Ht 5\' 10"  (1.778 m)   Wt 162 lb 6.4 oz (73.7 kg)   BMI 23.30 kg/m   General: Well-nourished, well-developed in no acute distress.  Neuro: Alert and oriented x 3.  Grossly intact.  Psych: Alert and cooperative, normal mood and affect.  Some memory impairment. No exam performed.   Imaging  Studies: See HPI.  Assessment and Plan:   Meranda Dechaine is a 82 y.o. y/o female returns for follow-up of chronic idiopathic constipation.  She has tried Senokot, Citrucel, MiraLAX, and stool softener which have not helped.  I am giving her a trial of Linzess samples.  1.  Chronic idiopathic constipation Gave samples of Linzess 72 mcg QD for 1 week, then 145 mcg QD for 1 week. She will let me know which dose works best, and then she can call me back for a prescription.  She can use glycerin suppository prior to bowel movement to help with stool evacuation from the rectum. If Linzess does not work well, try OTC magnesium 250 mg 1 or 2 tablets daily. Consider Trulance or Amitiza in the future if needed.   Celso Amy, PA-C  Follow up in 4 to 6 weeks with TG.

## 2022-11-23 NOTE — Telephone Encounter (Signed)
   Spoke with patient-having trouble with constipation- she is having to manually remove-she started back on metamucil and its not helping. Patient added to schedule tomorrow.

## 2022-11-24 ENCOUNTER — Encounter: Payer: Self-pay | Admitting: Physician Assistant

## 2022-11-24 ENCOUNTER — Ambulatory Visit: Payer: Medicare Other | Admitting: Physician Assistant

## 2022-11-24 VITALS — BP 114/70 | HR 63 | Temp 97.4°F | Ht 70.0 in | Wt 162.4 lb

## 2022-11-24 DIAGNOSIS — K5904 Chronic idiopathic constipation: Secondary | ICD-10-CM

## 2022-11-24 MED ORDER — LINACLOTIDE 72 MCG PO CAPS: 72.0000 ug | ORAL_CAPSULE | Freq: Every day | ORAL | Status: DC

## 2022-11-24 MED ORDER — LINACLOTIDE 145 MCG PO CAPS: 145.0000 ug | ORAL_CAPSULE | Freq: Every day | ORAL | 0 refills | Status: DC

## 2022-11-24 NOTE — Patient Instructions (Signed)
Gave samples of Linzess 72 mcg QD for 1 week, then 145 mcg QD for 1 week  Call me in 2 weeks and let me know which dose of Linzess works best, and then I can call in a prescription.   You can also use OTC glycerin suppository, insert 1 into the rectum prior to a bowel movement.  If Linzess does not work well, then try OTC magnesium 250 mg 1 or 2 capsules daily to help with constipation.  It was great to see you today!   Please keep me posted on your symptoms. -Inetta Fermo

## 2022-11-24 NOTE — Telephone Encounter (Signed)
Pharmacy tech [facility]) called in because they are unable to break it up the Linzess comes in a 30 count bottle.

## 2022-11-30 DIAGNOSIS — E785 Hyperlipidemia, unspecified: Secondary | ICD-10-CM | POA: Diagnosis not present

## 2022-11-30 DIAGNOSIS — E039 Hypothyroidism, unspecified: Secondary | ICD-10-CM | POA: Diagnosis not present

## 2022-11-30 DIAGNOSIS — I10 Essential (primary) hypertension: Secondary | ICD-10-CM | POA: Diagnosis not present

## 2022-11-30 DIAGNOSIS — M8580 Other specified disorders of bone density and structure, unspecified site: Secondary | ICD-10-CM | POA: Diagnosis not present

## 2022-12-08 DIAGNOSIS — Z0000 Encounter for general adult medical examination without abnormal findings: Secondary | ICD-10-CM | POA: Diagnosis not present

## 2022-12-08 DIAGNOSIS — I10 Essential (primary) hypertension: Secondary | ICD-10-CM | POA: Diagnosis not present

## 2022-12-08 DIAGNOSIS — I700 Atherosclerosis of aorta: Secondary | ICD-10-CM | POA: Diagnosis not present

## 2022-12-15 DIAGNOSIS — R82998 Other abnormal findings in urine: Secondary | ICD-10-CM | POA: Diagnosis not present

## 2022-12-22 ENCOUNTER — Telehealth: Payer: Self-pay

## 2022-12-22 NOTE — Telephone Encounter (Signed)
PT spouse steve call to get info about pt next upcoming appt left message on vm

## 2022-12-28 NOTE — Progress Notes (Unsigned)
Celso Amy, PA-C [address]  [address]  [place], [place] [address]  Main: [phone]  Fax: [phone]   Primary Care Physician: Rodrigo Ran, MD  Primary Gastroenterologist:  Celso Amy, PA-C   CC:  F/U Constipation  HPI: Christina [name] is a 82 y.o. female returns for follow-up of irregular BMs and chronic constipation. She has tried Senokot, Citrucel, MiraLAX, and stool softener which caused diarrhea. One month ago she was given Linzess 72 and 145 samples to try.  Both doses of Linzess caused diarrhea.  She is not currently taking any treatment for constipation.  She is currently having bowel movement once daily which is soft.  Sometimes pudding consistency.  She is not having any hard stools.  She has a weak rectum and it is difficult for her to push the stool out of the rectum.  Occasionally uses manual disimpaction.  She tried Kegel exercises in the past.  She denies abdominal pain, rectal bleeding, weight loss, or alarm symptoms.  She has moderate memory impairment.  Here today with her husband who helps with her care.  Abdominal x-ray 06/2022 showed stool in the cecum and ascending colon, consistent with constipation.     GI history: She saw Dr. Adela Lank at [facility] 06/2019 for change in bowel habits, urgent Bms, Loose Bms, incomplete BM.  Episodes of fecal incontinence.  Loose and small balls of stool.  Tried fiber supplement and stool soft with no benefit.  No fam hx IBD, celiac, or colon cancer.  Took Omeprazole 20mg  QD for GERD.   Colonoscopy 2014 - Dr. Randa Evens - No polyps, Normal, Good Prep.   Colonoscopy 06/2019 - Dr. Adela Lank - 5 small adenomatous Polyps removed:  6mm pedunculated polyp sigmoid; Two 3mm & 6mm polyps ascending colon, 3mm polyp transver colon, dimunitive polyp cecum.  Biopsies: Negative for Microscopic Colitis.   06/2019 - Negative Celiac Labs.  NO EGD.   Hx of  A-Fib (On Eliquis), Hx thyroid cancer, Breast cancer, melanoma &  diverticulitis in the past.  Hx rectal sphincter surgery, cystocele mesh repair, and hysterectomy.  Mild dementia.   12/2021 - Abd / Pelvic CT with contrast, to eval low abd pain / diarrhea / nausea - showed No acute abnormality.  Benign liver cyst, Moderate stool in colon, mild stable chronic stomach thickening.  Current Outpatient Medications  Medication Sig Dispense Refill   acetaminophen (TYLENOL) 500 MG tablet Take 500-1,000 mg by mouth daily as needed for mild pain or headache.     apixaban (ELIQUIS) 5 MG TABS tablet TAKE ONE TABLET BY MOUTH TWICE A DAY 60 tablet 5   calcium citrate-vitamin D (CITRACAL+D) 315-200 MG-UNIT tablet Take 2 tablets by mouth 2 (two) times daily.     cetirizine (ZYRTEC) 10 MG tablet Take 10 mg by mouth as needed for allergies.     diltiazem (CARDIZEM CD) 120 MG 24 hr capsule TAKE ONE CAPSULE BY MOUTH DAILY 90 capsule 1   fluticasone (FLONASE) 50 MCG/ACT nasal spray 1 spray in each nostril     Fluticasone Propionate, Inhal, 50 MCG/ACT AEPB Inhale 2 puffs into the lungs as needed.     galantamine (RAZADYNE ER) 24 MG 24 hr capsule Take 24 mg by mouth daily with breakfast.     Glucosamine-Chondroitin (OSTEO BI-FLEX REGULAR STRENGTH PO) Take 2 tablets by mouth daily after supper.     HYDROcodone-acetaminophen (NORCO) 5-325 MG tablet Take 1 tablet by mouth every 6 (six) hours as needed for moderate pain. 30 tablet  0   ibuprofen (ADVIL) 800 MG tablet Take 1 tablet (800 mg total) by mouth every 6 (six) hours as needed. 60 tablet 1   levothyroxine (SYNTHROID, LEVOTHROID) 150 MCG tablet Take 150 mcg by mouth See admin instructions. Take 150 mcg before breakfast Monday through Saturday. Skip dose on Sundays     memantine (NAMENDA) 5 MG tablet Take 5 mg by mouth 2 (two) times daily.     Misc Natural Products (OSTEO BI-FLEX ADV TRIPLE ST) TABS Take 2 tablets by mouth at bedtime.     Multiple Vitamin (MULTI-VITAMIN DAILY PO) Take 1 tablet by mouth daily.     Multiple  Vitamins-Minerals (PRESERVISION AREDS) CAPS Take 1 capsule by mouth 2 (two) times daily.      nebivolol (BYSTOLIC) 5 MG tablet Take 2.5 mg by mouth at bedtime.     Omega-3 Fatty Acids (FISH OIL) 1200 MG CAPS Take 2,400 mg by mouth daily.     omeprazole (PRILOSEC) 20 MG capsule Take 20 mg by mouth daily.     oxyCODONE-acetaminophen (PERCOCET) 10-325 MG tablet Take 1 tablet by mouth every 4 (four) hours as needed for pain. 30 tablet 0   potassium chloride SA (K-DUR,KLOR-CON) 20 MEQ tablet Take 40 mEq by mouth daily.      rosuvastatin (CRESTOR) 40 MG tablet TAKE ONE TABLET BY MOUTH ONCE A DAY 90 tablet 3   triamterene-hydrochlorothiazide (MAXZIDE-25) 37.5-25 MG tablet Take 1 tablet by mouth daily.     No current facility-administered medications for this visit.    Allergies as of 12/29/2022 - Review Complete 12/29/2022  Allergen Reaction Noted   Carbamazepine Hives, Rash, and Other (See Comments) 08/03/2010   Oxaprozin Hives, Rash, and Other (See Comments) 08/03/2010   Amoxicillin-pot clavulanate Diarrhea and Nausea Only 09/06/2017   Ciprofibrate Other (See Comments) 12/28/2020   Miralax [polyethylene glycol] Other (See Comments) 12/28/2020   Oxycodone-acetaminophen  11/30/2019   Pedi-pre tape spray [wound dressing adhesive] Other (See Comments) 12/28/2020   Prednisolone  09/30/2016   Ramipril Other (See Comments) 03/13/2009   Prednisone Itching and Other (See Comments) 03/29/2013    Past Medical History:  Diagnosis Date   Atypical chest pain    a. 08/2015 Cath: LM nl, LAD 20ost, D1 small/nl, D2 mod, nl, RI small, nl, LCX nl, OM1 small, nl, OM2 mod nl, RCA 32m, EF 55-65%.   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 CHA2DS2VASc = 3-->Xarelto since 2015;  b. 08/2015 48h holter in setting of palpitations-->PVC's.   Palpitations    a. 08/2015 48h Holter: pvc's.   Papillary carcinoma (HCC)    Post-menopausal    PVCs (premature ventricular contractions)    Shingles    Thyroid cancer (HCC)    s/p THYROIDECTOMY   Whooping cough    as a child    Past Surgical History:  Procedure Laterality Date    tarsal tunnel release  2002   Right/Left  tarsal tunnel release   CARDIAC CATHETERIZATION  04/21/07   GLOBAL ESTIMATED EF  IS 60 %; NORMAL LV FUNCTION; NORMAL CORONARY ARTERIES; BORDERLINE INCREASE IN THE AORTIC ROOT   CARDIAC CATHETERIZATION N/A 09/18/2015   Procedure: Left Heart Cath and Coronary Angiography;  Surgeon: Kathleene Hazel, MD;  Location: [facility] INVASIVE CV LAB;  Service: Cardiovascular;  Laterality: N/A;   COLONOSCOPY     CYSTOSCOPY  2007   anterior repair of cystocele with mesh   IR ANGIOGRAM EXTREMITY LEFT  11/05/2019   IR RADIOLOGIST EVAL & MGMT  11/01/2019   IR US GUIDE VASC ACCESS LEFT  11/05/2019   MASTECTOMY  2000   LEFT BREAST   MELANOMA EXCISION  1991   REMOVED FROM LEFT LEG   NASAL SINUS SURGERY     THYROIDECTOMY      Review of Systems:    All systems reviewed and negative except where noted in HPI.   Physical Examination:   BP 129/74   Pulse (!) 59   Temp 97.9 F (36.6 C)   Ht 5\' 10"  (1.778 m)   Wt 159 lb (72.1 kg)   BMI 22.81 kg/m   General: Well-nourished, well-developed in no acute distress.  Lungs: Clear to auscultation bilaterally. Non-labored. Heart: Regular rate and rhythm, no murmurs rubs or gallops.  Abdomen: Bowel sounds are normal; Abdomen is Soft; No hepatosplenomegaly, masses or hernias;  No Abdominal Tenderness; No guarding or rebound tenderness. Neuro: Alert and oriented x 3.  Grossly intact.  Moderate memory impairment. Psych: Alert and cooperative, normal mood  and affect.   Imaging Studies: No results found.  Assessment and Plan:   Christina [name] is a 82 y.o. y/o female returns for follow-up of chronic constipation.  She tried Senokot, Citrucel, MiraLAX, and Linzess with little benefit.  1.  Chronic constipation  If she is hard stools, straining, or skipping days between bowel movements, and I recommend she try samples of Trulance 3 mg 1 tablet once daily.  I gave samples to try if needed.  She can call back for prescription if it works well.  Continue 64 ounces of fluids daily and high-fiber diet.  2.  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  If Benefiber does not work well, then try FiberCon tablets, take 2 tablets once daily with 8 ounces glass of water.  Celso Amy, PA-C  Follow up as needed if worsening GI symptoms.

## 2022-12-29 ENCOUNTER — Encounter: Payer: Self-pay | Admitting: Physician Assistant

## 2022-12-29 ENCOUNTER — Ambulatory Visit: Payer: Medicare Other | Admitting: Physician Assistant

## 2022-12-29 VITALS — BP 129/74 | HR 59 | Temp 97.9°F | Ht 70.0 in | Wt 159.0 lb

## 2022-12-29 DIAGNOSIS — M9905 Segmental and somatic dysfunction of pelvic region: Secondary | ICD-10-CM | POA: Diagnosis not present

## 2022-12-29 DIAGNOSIS — K5904 Chronic idiopathic constipation: Secondary | ICD-10-CM

## 2022-12-29 DIAGNOSIS — R198 Other specified symptoms and signs involving the digestive system and abdomen: Secondary | ICD-10-CM | POA: Diagnosis not present

## 2022-12-29 DIAGNOSIS — K5909 Other constipation: Secondary | ICD-10-CM | POA: Diagnosis not present

## 2022-12-29 DIAGNOSIS — K5901 Slow transit constipation: Secondary | ICD-10-CM

## 2022-12-29 NOTE — Patient Instructions (Signed)
I recommend try a Fiber Supplement Daily (Pick One): Benefiber Powder, Mix 1 Tablespoon in a drink once daily. Fibercon Tablets, Take 2 tablets once daily with 8 ounces of water.  If you have worsening Constipation with hard stools, straining, or no bowel movement for several days, then try samples of: Trulance 3 Mg 1 tablet once daily.  Continue drinking 64 ounces of fluids daily. Continue high-fiber diet with fruits, vegetables, and whole grains.

## 2022-12-31 ENCOUNTER — Other Ambulatory Visit: Payer: Self-pay | Admitting: Internal Medicine

## 2022-12-31 DIAGNOSIS — Z1231 Encounter for screening mammogram for malignant neoplasm of breast: Secondary | ICD-10-CM

## 2023-01-16 ENCOUNTER — Other Ambulatory Visit: Payer: Self-pay

## 2023-01-16 ENCOUNTER — Emergency Department (HOSPITAL_BASED_OUTPATIENT_CLINIC_OR_DEPARTMENT_OTHER)
Admission: EM | Admit: 2023-01-16 | Discharge: 2023-01-16 | Disposition: A | Discharge status: Home / Self Care | Payer: Medicare Other | Attending: Emergency Medicine | Admitting: Emergency Medicine

## 2023-01-16 ENCOUNTER — Encounter (HOSPITAL_BASED_OUTPATIENT_CLINIC_OR_DEPARTMENT_OTHER): Payer: Self-pay | Admitting: Emergency Medicine

## 2023-01-16 DIAGNOSIS — Z85820 Personal history of malignant melanoma of skin: Secondary | ICD-10-CM | POA: Insufficient documentation

## 2023-01-16 DIAGNOSIS — Z85850 Personal history of malignant neoplasm of thyroid: Secondary | ICD-10-CM | POA: Diagnosis not present

## 2023-01-16 DIAGNOSIS — Z79899 Other long term (current) drug therapy: Secondary | ICD-10-CM | POA: Insufficient documentation

## 2023-01-16 DIAGNOSIS — Z853 Personal history of malignant neoplasm of breast: Secondary | ICD-10-CM | POA: Insufficient documentation

## 2023-01-16 DIAGNOSIS — I10 Essential (primary) hypertension: Secondary | ICD-10-CM | POA: Insufficient documentation

## 2023-01-16 DIAGNOSIS — Z8505 Personal history of malignant neoplasm of liver: Secondary | ICD-10-CM | POA: Diagnosis not present

## 2023-01-16 DIAGNOSIS — R040 Epistaxis: Secondary | ICD-10-CM | POA: Diagnosis not present

## 2023-01-16 DIAGNOSIS — Z7901 Long term (current) use of anticoagulants: Secondary | ICD-10-CM | POA: Diagnosis not present

## 2023-01-16 DIAGNOSIS — E039 Hypothyroidism, unspecified: Secondary | ICD-10-CM | POA: Diagnosis not present

## 2023-01-16 LAB — CBC
HCT: 44.9 % (ref 36.0–46.0)
Hemoglobin: 14.4 g/dL (ref 12.0–15.0)
MCH: 29 pg (ref 26.0–34.0)
MCHC: 32.1 g/dL (ref 30.0–36.0)
MCV: 90.5 fL (ref 80.0–100.0)
Platelets: 248 10*3/uL (ref 150–400)
RBC: 4.96 MIL/uL (ref 3.87–5.11)
RDW: 14.5 % (ref 11.5–15.5)
WBC: 9.9 10*3/uL (ref 4.0–10.5)
nRBC: 0 % (ref 0.0–0.2)

## 2023-01-16 LAB — PROTIME-INR
INR: 1.3 — ABNORMAL HIGH (ref 0.8–1.2)
Prothrombin Time: 16.6 s — ABNORMAL HIGH (ref 11.4–15.2)

## 2023-01-16 MED ORDER — OXYMETAZOLINE HCL 0.05 % NA SOLN
1.0000 | Freq: Once | NASAL | Status: AC
  Administered 2023-01-16: 1 via NASAL

## 2023-01-16 NOTE — ED Notes (Signed)
Pt alert and oriented X 4 at the time of discharge. RR even and unlabored. No acute distress noted. No bleeding at the time of discharge. Pt verbalized understanding of discharge instructions as discussed. Pt ambulatory to lobby at time of discharge.

## 2023-01-16 NOTE — ED Provider Notes (Signed)
[place] EMERGENCY DEPARTMENT AT [facility] Provider Note   CSN: [id number] Arrival date & time: 01/16/23  1313     History  Chief Complaint  Patient presents with   Epistaxis    Christina [name] is a 82 y.o. female.  HPI     82 year old female with history hypertension, hyperlipidemia, breast cancer, atrial fibrillation on Eliquis, presents with concern for epistaxis.   Reports the bleeding started at 1015 today.  Thinks is coming from the right side, but noted came from both sides.  She was able to get it stopped but then it just started again.  No trauma.  Had history of 1 prior nosebleed from right side that was severe enough that she came to the emergency department and that bleed was able to be stopped with pressure.   Past Medical History:  Diagnosis Date   Atypical chest pain    a. 08/2015 Cath: LM nl, LAD 20ost, D1 small/nl, D2 mod, nl, RI small, nl, LCX nl, OM1 small, nl, OM2 mod nl, RCA 69m, EF 55-65%.   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 CHA2DS2VASc = 3-->Xarelto since 2015;  b. 08/2015 48h holter in setting of palpitations-->PVC's.   Palpitations    a. 08/2015 48h Holter: pvc's.   Papillary carcinoma (HCC)    Post-menopausal    PVCs (premature ventricular contractions)    Shingles    Thyroid cancer (HCC)    s/p THYROIDECTOMY   Whooping cough    as a child     Home Medications Prior to Admission medications   Medication Sig Start Date End Date Taking? Authorizing Provider  acetaminophen (TYLENOL) 500 MG tablet Take 500-1,000 mg by mouth daily as needed for mild pain or  headache.    [provider]  apixaban (ELIQUIS) 5 MG TABS tablet TAKE ONE TABLET BY MOUTH TWICE A DAY 05/24/22   Kathleene Hazel, MD  calcium citrate-vitamin D (CITRACAL+D) 315-200 MG-UNIT tablet Take 2 tablets by mouth 2 (two) times daily.    [provider]  cetirizine (ZYRTEC) 10 MG tablet Take 10 mg by mouth as needed for allergies.    [provider]  diltiazem (CARDIZEM CD) 120 MG 24 hr capsule TAKE ONE CAPSULE BY MOUTH DAILY 09/22/16   Kathleene Hazel, MD  fluticasone [facility]) 50 MCG/ACT nasal spray 1 spray in each nostril 06/27/17   [provider]  Fluticasone Propionate, Inhal, 50 MCG/ACT AEPB Inhale 2 puffs into the lungs as needed.    [provider]  galantamine (RAZADYNE ER) 24 MG 24 hr capsule Take 24 mg by mouth daily with breakfast.    [provider]  Glucosamine-Chondroitin (OSTEO BI-FLEX REGULAR STRENGTH PO) Take 2 tablets by mouth daily after supper.    [provider]  HYDROcodone-acetaminophen (NORCO) 5-325 MG tablet Take 1 tablet by mouth every 6 (six) hours as needed for moderate pain. 12/15/20   Candelaria Stagers, DPM  ibuprofen (ADVIL) 800 MG tablet Take 1 tablet (800 mg  total) by mouth every 6 (six) hours as needed. 12/15/20   Candelaria Stagers, DPM  levothyroxine (SYNTHROID, LEVOTHROID) 150 MCG tablet Take 150 mcg by mouth See admin instructions. Take 150 mcg before breakfast Monday through Saturday. Skip dose on Sundays    [provider]  memantine (NAMENDA) 5 MG tablet Take 5 mg by mouth 2 (two) times daily. 11/18/20   [provider]  Misc Natural Products (OSTEO BI-FLEX ADV TRIPLE ST) TABS Take 2 tablets by mouth at bedtime.    [provider]  Multiple Vitamin (MULTI-VITAMIN DAILY PO) Take 1 tablet by mouth daily. 03/13/09   [provider]  Multiple Vitamins-Minerals (PRESERVISION AREDS) CAPS Take 1 capsule by mouth 2 (two) times daily.     [provider]   nebivolol (BYSTOLIC) 5 MG tablet Take 2.5 mg by mouth at bedtime.    [provider]  Omega-3 Fatty Acids (FISH OIL) 1200 MG CAPS Take 2,400 mg by mouth daily.    [provider]  omeprazole (PRILOSEC) 20 MG capsule Take 20 mg by mouth daily.    [provider]  oxyCODONE-acetaminophen (PERCOCET) 10-325 MG tablet Take 1 tablet by mouth every 4 (four) hours as needed for pain. 11/12/19   Candelaria Stagers, DPM  potassium chloride SA (K-DUR,KLOR-CON) 20 MEQ tablet Take 40 mEq by mouth daily.     [provider]  rosuvastatin (CRESTOR) 40 MG tablet TAKE ONE TABLET BY MOUTH ONCE A DAY 05/03/22   Kathleene Hazel, MD  triamterene-hydrochlorothiazide (MAXZIDE-25) 37.5-25 MG tablet Take 1 tablet by mouth daily.    [provider]      Allergies    Carbamazepine, Oxaprozin, Amoxicillin-pot clavulanate, Ciprofibrate, Miralax [polyethylene glycol], Oxycodone-acetaminophen, Pedi-pre tape spray [wound dressing adhesive], Prednisolone, Ramipril, and Prednisone    Review of Systems   Review of Systems  Physical Exam Updated Vital Signs BP 124/84 (BP Location: Right Arm)   Pulse 73   Temp 98.1 F (36.7 C)   Resp 18   SpO2 99%  Physical Exam Vitals and nursing note reviewed.  Constitutional:      General: She is not in acute distress.    Appearance: Normal appearance. She is not ill-appearing, toxic-appearing or diaphoretic.  HENT:     Head: Normocephalic.     Comments: On subsequent evaluation left nares without bleeding or abnormality Right nare with blood clot present on initial evaluation, no sign of active bleeding Eyes:     Conjunctiva/sclera: Conjunctivae normal.  Cardiovascular:     Rate and Rhythm: Normal rate and regular rhythm.     Pulses: Normal pulses.  Pulmonary:     Effort: Pulmonary effort is normal. No respiratory distress.  Musculoskeletal:        General: No deformity or signs of injury.     Cervical back: No rigidity.   Skin:    General: Skin is warm and dry.     Coloration: Skin is not jaundiced or pale.  Neurological:     General: No focal deficit present.     Mental Status: She is alert and oriented to person, place, and time.     ED Results / Procedures / Treatments   Labs (all labs ordered are listed, but only abnormal results are displayed) Labs Reviewed  PROTIME-INR - Abnormal; Notable for the following components:      Result Value   Prothrombin Time 16.6 (*)    INR 1.3 (*)    All other components within normal limits  CBC    EKG None  Radiology No results found.  Procedures Procedures    Medications Ordered in ED Medications  oxymetazoline (AFRIN) 0.05 % nasal spray 1 spray (1 spray Each Nare Given 01/16/23 1329)    ED Course/ Medical Decision Making/ A&P                                    82 year old female with history hypertension, hyperlipidemia, breast cancer, atrial fibrillation on Eliquis, presents with concern for epistaxis.   Labs completed in triage show no evidence of anemia, no thrombocytopenia, INR mildly elevated with pt on eliquis.  Received afrin and pressure just prior to my evaluation no significant continued bleeding seen-applied additional pressure then removed nose clip and observed without re-bleeding.  Do not see area to cauterize at this time.  Bleeding controlled with afrin and pressure and do not see indication for nasal packing.  Discussed nosebleed care.  Patient discharged in stable condition with understanding of reasons to return.          Final Clinical Impression(s) / ED Diagnoses Final diagnoses:  None    Rx / DC Orders ED Discharge Orders     None         Alvira Monday, MD 01/16/23 2144

## 2023-01-16 NOTE — ED Triage Notes (Signed)
Epistaxis started today , active bleeding in triage , clots noted , on Eliquis for A fib .  No Hx of it . No HTN .  Alert and oriented x 4

## 2023-01-18 ENCOUNTER — Encounter (HOSPITAL_COMMUNITY): Payer: Self-pay

## 2023-01-18 ENCOUNTER — Emergency Department (HOSPITAL_COMMUNITY)
Admission: EM | Admit: 2023-01-18 | Discharge: 2023-01-18 | Disposition: A | Discharge status: Home / Self Care | Payer: Medicare Other | Attending: Emergency Medicine | Admitting: Emergency Medicine

## 2023-01-18 ENCOUNTER — Other Ambulatory Visit: Payer: Self-pay

## 2023-01-18 DIAGNOSIS — Z7901 Long term (current) use of anticoagulants: Secondary | ICD-10-CM | POA: Insufficient documentation

## 2023-01-18 DIAGNOSIS — R040 Epistaxis: Secondary | ICD-10-CM | POA: Diagnosis not present

## 2023-01-18 LAB — CBC
HCT: 38 % (ref 36.0–46.0)
Hemoglobin: 12.2 g/dL (ref 12.0–15.0)
MCH: 28.4 pg (ref 26.0–34.0)
MCHC: 32.1 g/dL (ref 30.0–36.0)
MCV: 88.6 fL (ref 80.0–100.0)
Platelets: 219 10*3/uL (ref 150–400)
RBC: 4.29 MIL/uL (ref 3.87–5.11)
RDW: 14.2 % (ref 11.5–15.5)
WBC: 11.7 10*3/uL — ABNORMAL HIGH (ref 4.0–10.5)
nRBC: 0 % (ref 0.0–0.2)

## 2023-01-18 LAB — PROTIME-INR
INR: 1.4 — ABNORMAL HIGH (ref 0.8–1.2)
Prothrombin Time: 17.4 s — ABNORMAL HIGH (ref 11.4–15.2)

## 2023-01-18 MED ORDER — OXYMETAZOLINE HCL 0.05 % NA SOLN
1.0000 | Freq: Once | NASAL | Status: AC
  Administered 2023-01-18: 1 via NASAL
  Filled 2023-01-18: qty 30

## 2023-01-18 NOTE — Discharge Instructions (Addendum)
Please follow-up with [place] in her throat.  Please read attached guide concerning nosebleeds.

## 2023-01-18 NOTE — ED Triage Notes (Addendum)
Pt to ED via pov from doctor's office. Pt has a nosebleed they have been unable to stop. Pt takes eliquis for a fib. Pt currently has packing in right nare and under lip. Nosebleed started this am. Pt recently seen for same. Pt states she lost a large amount of blood.

## 2023-01-18 NOTE — ED Provider Notes (Signed)
[place] EMERGENCY DEPARTMENT AT [facility] Provider Note   CSN: [id number] Arrival date & time: 01/18/23  1245     History  Chief Complaint  Patient presents with   Epistaxis    Christina [name] is a 82 y.o. female with A-fib on Eliquis.  Patient presents to the ED for evaluation epistaxis.  States that on Sunday she had episode epistaxis requiring her to go to outside ED.  She was seen at that time and had no active bleeding and was discharged.  She states that beginning this morning she developed epistaxis and bleeding from her right nare.  Reports that there was a significant amount of blood loss.  They attempted to use Afrin but unsuccessful at home.  Went to outside PCP and was redirected to ED.  Patient denies shortness of breath, weakness, lightheadedness or dizziness.  She arrives with her nose packed.   Epistaxis Associated symptoms: no dizziness        Home Medications Prior to Admission medications   Medication Sig Start Date End Date Taking? Authorizing Provider  acetaminophen (TYLENOL) 500 MG tablet Take 500-1,000 mg by mouth daily as needed for mild pain or headache.    [provider]  apixaban (ELIQUIS) 5 MG TABS tablet TAKE ONE TABLET BY MOUTH TWICE A DAY 05/24/22   Kathleene Hazel, MD  calcium citrate-vitamin D (CITRACAL+D) 315-200 MG-UNIT tablet Take 2 tablets by mouth 2 (two) times daily.    [provider]  cetirizine (ZYRTEC) 10 MG tablet Take 10 mg by mouth as needed for allergies.    [provider]  diltiazem (CARDIZEM CD) 120 MG 24 hr capsule TAKE ONE CAPSULE BY MOUTH DAILY 09/22/16   Kathleene Hazel, MD  galantamine (RAZADYNE ER) 24 MG 24 hr capsule Take 24 mg by mouth daily with breakfast.    [provider]  Glucosamine-Chondroitin (OSTEO BI-FLEX REGULAR STRENGTH PO) Take 2 tablets by mouth daily after supper.    [provider]  ibuprofen (ADVIL) 800 MG tablet Take 1 tablet  (800 mg total) by mouth every 6 (six) hours as needed. 12/15/20   Candelaria Stagers, DPM  levothyroxine (SYNTHROID, LEVOTHROID) 150 MCG tablet Take 150 mcg by mouth See admin instructions. Take 150 mcg before breakfast Monday through Saturday. Skip dose on Sundays    [provider]  memantine (NAMENDA) 5 MG tablet Take 5 mg by mouth 2 (two) times daily. 11/18/20   [provider]  Misc Natural Products (OSTEO BI-FLEX ADV TRIPLE ST) TABS Take 2 tablets by mouth at bedtime.    [provider]  Multiple Vitamin (MULTI-VITAMIN DAILY PO) Take 1 tablet by mouth daily. 03/13/09   [provider]  Multiple Vitamins-Minerals (PRESERVISION AREDS) CAPS Take 1 capsule by mouth 2 (two) times daily.     [provider]  nebivolol (BYSTOLIC) 5 MG tablet Take 2.5 mg by mouth at bedtime.    [provider]  Omega-3 Fatty Acids (FISH OIL) 1200 MG CAPS Take 2,400 mg by mouth daily.    [provider]  omeprazole (PRILOSEC) 20 MG capsule Take 20 mg by mouth daily.    [provider]  potassium chloride SA (K-DUR,KLOR-CON) 20 MEQ tablet Take 40 mEq by mouth daily.     [provider]  rosuvastatin (CRESTOR) 40 MG tablet TAKE ONE TABLET BY MOUTH ONCE A DAY 05/03/22   Kathleene Hazel, MD  triamterene-hydrochlorothiazide (MAXZIDE-25) 37.5-25 MG tablet Take 1 tablet by mouth daily.  [provider]      Allergies    Carbamazepine, Oxaprozin, Amoxicillin-pot clavulanate, Ciprofibrate, Miralax [polyethylene glycol], Oxycodone-acetaminophen, Pedi-pre tape spray [wound dressing adhesive], Prednisolone, Ramipril, and Prednisone    Review of Systems   Review of Systems  HENT:  Positive for nosebleeds.   Neurological:  Negative for dizziness, weakness and light-headedness.    Physical Exam Updated Vital Signs BP (!) 131/97 (BP Location: Right Arm)   Pulse 67   Temp 98 F (36.7 C)   Resp 16   Ht 5\' 10"  (1.778 m)   Wt 72 kg    SpO2 100%   BMI 22.78 kg/m  Physical Exam Vitals and nursing note reviewed.  HENT:     Head: Normocephalic and atraumatic.     Nose: Nose normal.     Right Nostril: No epistaxis.     Left Nostril: No epistaxis.     Comments: No bleeding from bilateral nares.     Mouth/Throat:     Mouth: Mucous membranes are moist.     Pharynx: Oropharynx is clear.  Eyes:     Extraocular Movements: Extraocular movements intact.     Conjunctiva/sclera: Conjunctivae normal.     Pupils: Pupils are equal, round, and reactive to light.  Cardiovascular:     Rate and Rhythm: Normal rate and regular rhythm.  Pulmonary:     Effort: Pulmonary effort is normal.     Breath sounds: Normal breath sounds.  Abdominal:     General: Abdomen is flat.     Tenderness: There is no abdominal tenderness.  Skin:    General: Skin is warm and dry.     Capillary Refill: Capillary refill takes less than 2 seconds.  Neurological:     Mental Status: She is alert and oriented to person, place, and time.     ED Results / Procedures / Treatments   Labs (all labs ordered are listed, but only abnormal results are displayed) Labs Reviewed  CBC - Abnormal; Notable for the following components:      Result Value   WBC 11.7 (*)    All other components within normal limits  PROTIME-INR - Abnormal; Notable for the following components:   Prothrombin Time 17.4 (*)    INR 1.4 (*)    All other components within normal limits    EKG None  Radiology No results found.  Procedures Procedures   Medications Ordered in ED Medications  oxymetazoline (AFRIN) 0.05 % nasal spray 1 spray (1 spray Each Nare Given 01/18/23 1326)    ED Course/ Medical Decision Making/ A&P  Medical Decision Making Amount and/or Complexity of Data Reviewed Labs: ordered.  Risk OTC drugs.   82 year old female presents to ED for evaluation.  Please see HPI for further details.  On examination patient does have her right nare packed.   Packing was removed, the patient had no active bleeding.  The patient had a cottonball soaked in Afrin and applied to her right nare for 15 minutes.  On reexamination, the patient right nare continues to not be bleeding.  There is no hemorrhaging.  Patient CBC without any kind of anemia.  Her PT/INR is elevated however she is on Eliquis.  Will refer the patient to ENT.  She has no active bleeding at this time.  Will encourage close outpatient follow-up.  If she develops any new or worsening bleeding, I have advised her to return to the ED.     Final Clinical Impression(s) / ED Diagnoses Final diagnoses:  Right-sided epistaxis    Rx / DC Orders ED Discharge Orders     None         Clent Ridges 01/18/23 1432    Pricilla Loveless, MD 01/19/23 1244

## 2023-01-19 ENCOUNTER — Emergency Department (HOSPITAL_COMMUNITY)
Admission: EM | Admit: 2023-01-19 | Discharge: 2023-01-19 | Disposition: A | Discharge status: Home / Self Care | Payer: Medicare Other | Attending: Emergency Medicine | Admitting: Emergency Medicine

## 2023-01-19 ENCOUNTER — Encounter (HOSPITAL_COMMUNITY): Payer: Self-pay

## 2023-01-19 DIAGNOSIS — I4891 Unspecified atrial fibrillation: Secondary | ICD-10-CM | POA: Diagnosis not present

## 2023-01-19 DIAGNOSIS — Z7901 Long term (current) use of anticoagulants: Secondary | ICD-10-CM | POA: Insufficient documentation

## 2023-01-19 DIAGNOSIS — Z79899 Other long term (current) drug therapy: Secondary | ICD-10-CM | POA: Diagnosis not present

## 2023-01-19 DIAGNOSIS — R040 Epistaxis: Secondary | ICD-10-CM | POA: Insufficient documentation

## 2023-01-19 MED ORDER — LIDOCAINE-EPINEPHRINE 1 %-1:100000 IJ SOLN
10.0000 mL | Freq: Once | INTRAMUSCULAR | Status: AC
  Administered 2023-01-19: 10 mL via INTRADERMAL
  Filled 2023-01-19: qty 1

## 2023-01-19 MED ORDER — TRANEXAMIC ACID FOR EPISTAXIS
500.0000 mg | Freq: Once | TOPICAL | Status: AC
  Administered 2023-01-19: 500 mg via TOPICAL
  Filled 2023-01-19: qty 10

## 2023-01-19 NOTE — Discharge Instructions (Addendum)
We will reduce your dose of Eliquis from 5 mg twice per day to 2.5 mg twice per day (You can cut your current 5 mg tablet in half and take 1/2 in the morning and 1/2 at night).  You need to follow up with the ENT doctor next week to have your nose re-examined.

## 2023-01-19 NOTE — ED Triage Notes (Addendum)
Ongoing intermittent nosebleed since Sunday. On Eliquis.. No active bleeding at this time. Airway intact. Seen here yesterday for the same complaints.

## 2023-01-19 NOTE — ED Provider Notes (Signed)
[place] EMERGENCY DEPARTMENT AT [facility] Provider Note   CSN: [id number] Arrival date & time: 01/19/23  1219     History  Chief Complaint  Patient presents with   Epistaxis    Christina [name] is a 82 y.o. female with a history of A-fib on Eliquis presenting to the ED with recurring nosebleeds.  This is the patient's third visit to the ED in the past 3 days for episodes of nosebleeds.  On both prior occasions including yesterday she had no active bleeding her rate was controlled in the ED with Afrin.  She was discharged home.  They have an appointment with ENT on Wednesday.  Unfortunately her nose began bleeding again at home last night.  It is not currently bleeding.  HPI     Home Medications Prior to Admission medications   Medication Sig Start Date End Date Taking? Authorizing Provider  acetaminophen (TYLENOL) 500 MG tablet Take 500-1,000 mg by mouth daily as needed for mild pain or headache.    [provider]  apixaban (ELIQUIS) 5 MG TABS tablet TAKE ONE TABLET BY MOUTH TWICE A DAY 05/24/22   Kathleene Hazel, MD  calcium citrate-vitamin D (CITRACAL+D) 315-200 MG-UNIT tablet Take 2 tablets by mouth 2 (two) times daily.    [provider]  cetirizine (ZYRTEC) 10 MG tablet Take 10 mg by mouth as needed for allergies.    [provider]  diltiazem (CARDIZEM CD) 120 MG 24 hr capsule TAKE ONE CAPSULE BY MOUTH DAILY 09/22/16   Kathleene Hazel, MD  galantamine (RAZADYNE ER) 24 MG 24 hr capsule Take 24 mg by mouth daily with breakfast.    [provider]  Glucosamine-Chondroitin (OSTEO BI-FLEX REGULAR STRENGTH PO) Take 2 tablets by mouth daily after supper.    [provider]  ibuprofen (ADVIL) 800 MG tablet Take 1 tablet (800 mg total) by mouth every 6 (six) hours as needed. 12/15/20   Candelaria Stagers, DPM  levothyroxine (SYNTHROID, LEVOTHROID) 150 MCG tablet Take 150 mcg by mouth See admin instructions. Take  150 mcg before breakfast Monday through Saturday. Skip dose on Sundays    [provider]  memantine (NAMENDA) 5 MG tablet Take 5 mg by mouth 2 (two) times daily. 11/18/20   [provider]  Misc Natural Products (OSTEO BI-FLEX ADV TRIPLE ST) TABS Take 2 tablets by mouth at bedtime.    [provider]  Multiple Vitamin (MULTI-VITAMIN DAILY PO) Take 1 tablet by mouth daily. 03/13/09   [provider]  Multiple Vitamins-Minerals (PRESERVISION AREDS) CAPS Take 1 capsule by mouth 2 (two) times daily.     [provider]  nebivolol (BYSTOLIC) 5 MG tablet Take 2.5 mg by mouth at bedtime.    [provider]  Omega-3 Fatty Acids (FISH OIL) 1200 MG CAPS Take 2,400 mg by mouth daily.    [provider]  omeprazole (PRILOSEC) 20 MG capsule Take 20 mg by mouth daily.    [provider]  potassium chloride SA (K-DUR,KLOR-CON) 20 MEQ tablet Take 40 mEq by mouth daily.     [provider]  rosuvastatin (CRESTOR) 40 MG tablet TAKE ONE TABLET BY MOUTH ONCE A DAY 05/03/22   Kathleene Hazel, MD  triamterene-hydrochlorothiazide (MAXZIDE-25) 37.5-25 MG tablet Take 1 tablet by mouth daily.    [provider]      Allergies    Carbamazepine, Oxaprozin, Amoxicillin-pot clavulanate, Ciprofibrate, Miralax [polyethylene glycol], Oxycodone-acetaminophen, Pedi-pre tape spray [wound dressing adhesive], Prednisolone,  Ramipril, and Prednisone    Review of Systems   Review of Systems  Physical Exam Updated Vital Signs BP (!) 140/73 (BP Location: Right Arm)   Pulse 62   Temp 98 F (36.7 C) (Oral)   Resp 16   Ht 5\' 10"  (1.778 m)   Wt 72 kg   SpO2 100%   BMI 22.78 kg/m  Physical Exam Constitutional:      General: She is not in acute distress. HENT:     Head: Normocephalic and atraumatic.     Nose:     Comments: No visible bleeding from the anterior naris bilaterally Eyes:     Conjunctiva/sclera: Conjunctivae normal.      Pupils: Pupils are equal, round, and reactive to light.  Cardiovascular:     Rate and Rhythm: Normal rate and regular rhythm.  Pulmonary:     Effort: Pulmonary effort is normal. No respiratory distress.  Abdominal:     General: There is no distension.     Tenderness: There is no abdominal tenderness.  Skin:    General: Skin is warm and dry.  Neurological:     General: No focal deficit present.     Mental Status: She is alert. Mental status is at baseline.  Psychiatric:        Mood and Affect: Mood normal.        Behavior: Behavior normal.     ED Results / Procedures / Treatments   Labs (all labs ordered are listed, but only abnormal results are displayed) Labs Reviewed - No data to display  EKG None  Radiology No results found.  Procedures Epistaxis Management  Date/Time: 01/19/2023 3:24 PM  Performed by: Terald Sleeper, MD Authorized by: Terald Sleeper, MD   Consent:    Consent obtained:  Verbal   Consent given by:  Patient   Risks discussed:  Infection, nasal injury, pain and bleeding   Alternatives discussed:  Observation Universal protocol:    Procedure explained and questions answered to patient or proxy's satisfaction: yes     Relevant documents present and verified: yes     Test results available: yes     Imaging studies available: yes     Required blood products, implants, devices, and special equipment available: yes     Site/side marked: yes     Immediately prior to procedure, a time out was called: yes     Patient identity confirmed:  Arm band Anesthesia:    Anesthesia method:  Topical application   Topical anesthetic:  Epinephrine and lidocaine gel Procedure details:    Treatment site:  L anterior and R anterior   Treatment method:  Anterior pack   Treatment complexity:  Limited   Treatment episode: recurring   Post-procedure details:    Assessment:  Bleeding stopped   Procedure completion:  Tolerated well, no immediate  complications Comments:     Pledglet packing of anterior nares bilaterally soaked with TXA and lidocaine with epinephrine 1%     Medications Ordered in ED Medications  lidocaine-EPINEPHrine (XYLOCAINE W/EPI) 1 %-1:100000 (with pres) injection 10 mL (10 mLs Intradermal Given 01/19/23 1400)  tranexamic acid (CYKLOKAPRON) 1000 MG/10ML topical solution 500 mg (500 mg Topical Given 01/19/23 1400)    ED Course/ Medical Decision Making/ A&P Clinical Course as of 01/19/23 1525  Wed Jan 19, 2023  1331 Pledglets placed in bilateral nares [MT]  1440 Pledglet removed  [MT]  1524 Patient reassessed now with no further bleeding.  She is stable for  discharge at this time [MT]    Clinical Course User Index [MT] Renaye Rakers Kermit Balo, MD                                 Medical Decision Making Risk Prescription drug management.   Patient is here with persistent intermittent nosebleeds for 3 days.  No report of trauma to the nose.  I cannot visualize an active site of bleeding from the anterior naris bilaterally.  She is hemodynamically stable otherwise.  We will try a pledget soaked with TXA and lidocaine with epinephrine which hopefully will help with vasoconstriction.  I will also reduce her Eliquis dosing from 5 mg to 2.5 mg BID as a temporary measure until she can see an ENT specialist and have a more thorough nasal exam performed next week.    At this point she is not actively hemorrhaging or bleeding.  As I am not able to visualize a site of the bleed, and suspect it may be posterior, I would prefer not to blindly pack her nares bilaterally, which carries a risk of infection and necrosis.          Final Clinical Impression(s) / ED Diagnoses Final diagnoses:  Epistaxis    Rx / DC Orders ED Discharge Orders     None         Yoniel Arkwright, Kermit Balo, MD 01/19/23 1525

## 2023-01-28 DIAGNOSIS — Z7901 Long term (current) use of anticoagulants: Secondary | ICD-10-CM | POA: Diagnosis not present

## 2023-01-28 DIAGNOSIS — I480 Paroxysmal atrial fibrillation: Secondary | ICD-10-CM | POA: Diagnosis not present

## 2023-01-28 DIAGNOSIS — R040 Epistaxis: Secondary | ICD-10-CM | POA: Diagnosis not present

## 2023-02-02 ENCOUNTER — Ambulatory Visit
Admission: RE | Admit: 2023-02-02 | Discharge: 2023-02-02 | Disposition: A | Discharge status: Home / Self Care | Payer: Medicare Other | Source: Ambulatory Visit | Attending: Internal Medicine | Admitting: Internal Medicine

## 2023-02-02 DIAGNOSIS — Z1231 Encounter for screening mammogram for malignant neoplasm of breast: Secondary | ICD-10-CM

## 2023-02-22 DIAGNOSIS — H353211 Exudative age-related macular degeneration, right eye, with active choroidal neovascularization: Secondary | ICD-10-CM | POA: Diagnosis not present

## 2023-03-02 ENCOUNTER — Other Ambulatory Visit: Payer: Self-pay | Admitting: Cardiovascular Disease

## 2023-03-02 DIAGNOSIS — I480 Paroxysmal atrial fibrillation: Secondary | ICD-10-CM

## 2023-03-02 NOTE — Telephone Encounter (Signed)
 Eliquis  5mg  refill request received. Patient is 83 years old, weight-kg, Crea-1.0 on 05/06/22 via scanned labs from PCP, Diagnosis-Afib, and last seen by Orren Fabry on 04/05/22. Dose is appropriate based on dosing criteria. Will send in refill to requested pharmacy.

## 2023-04-04 DIAGNOSIS — K080 Exfoliation of teeth due to systemic causes: Secondary | ICD-10-CM | POA: Diagnosis not present

## 2023-05-13 ENCOUNTER — Encounter: Payer: Self-pay | Admitting: Cardiovascular Disease

## 2023-05-13 ENCOUNTER — Ambulatory Visit: Attending: Cardiovascular Disease | Admitting: Cardiovascular Disease

## 2023-05-13 VITALS — BP 114/78 | HR 66 | Ht 70.0 in | Wt 156.4 lb

## 2023-05-13 DIAGNOSIS — I2510 Atherosclerotic heart disease of native coronary artery without angina pectoris: Secondary | ICD-10-CM

## 2023-05-13 DIAGNOSIS — I5032 Chronic diastolic (congestive) heart failure: Secondary | ICD-10-CM

## 2023-05-13 DIAGNOSIS — E785 Hyperlipidemia, unspecified: Secondary | ICD-10-CM

## 2023-05-13 DIAGNOSIS — I480 Paroxysmal atrial fibrillation: Secondary | ICD-10-CM

## 2023-05-13 NOTE — Patient Instructions (Signed)
 Medication Instructions:  No changes *If you need a refill on your cardiac medications before your next appointment, please call your pharmacy*   Lab Work: none If you have labs (blood work) drawn today and your tests are completely normal, you will receive your results only by: MyChart Message (if you have MyChart) OR A paper copy in the mail If you have any lab test that is abnormal or we need to change your treatment, we will call you to review the results.   Testing/Procedures: Your physician has requested that you have an echocardiogram. Echocardiography is a painless test that uses sound waves to create images of your heart. It provides your doctor with information about the size and shape of your heart and how well your heart's chambers and valves are working. This procedure takes approximately one hour. There are no restrictions for this procedure. Please do NOT wear cologne, perfume, aftershave, or lotions (deodorant is allowed). Please arrive 15 minutes prior to your appointment time.  Please note: We ask at that you not bring children with you during ultrasound (echo/ vascular) testing. Due to room size and safety concerns, children are not allowed in the ultrasound rooms during exams. Our front office staff cannot provide observation of children in our lobby area while testing is being conducted. An adult accompanying a patient to their appointment will only be allowed in the ultrasound room at the discretion of the ultrasound technician under special circumstances. We apologize for any inconvenience.    Follow-Up: At [facility], you and your health needs are our priority.  As part of our continuing mission to provide you with exceptional heart care, we have created designated Provider Care Teams.  These Care Teams include your primary Cardiologist (physician) and Advanced Practice Providers (APPs -  Physician Assistants and Nurse Practitioners) who all work together to  provide you with the care you need, when you need it.  We recommend signing up for the patient portal called "MyChart".  Sign up information is provided on this After Visit Summary.  MyChart is used to connect with patients for Virtual Visits (Telemedicine).  Patients are able to view lab/test results, encounter notes, upcoming appointments, etc.  Non-urgent messages can be sent to your provider as well.   To learn more about what you can do with MyChart, go to [email or web address].    Your next appointment:   12 month(s)  Provider:   Verne Carrow, MD     Other Instructions

## 2023-05-13 NOTE — Progress Notes (Signed)
 Chief Complaint  Patient presents with   Follow-up    PAF   History of Present Illness: 83yo female with history of paroxysmal atrial fibrillation, SVT, PVCs, HTN, breast cancer and mild CAD who is here for follow up. She has been known to have paroxysmal atrial fibrillation for years and was started on Xarelto in 2015. She was seen in the ED 11/22/14 with palpitations, nausea and SOB and was found to be in atrial fibrillation with RVR. She converted to sinus in the ED spontaneously. She reported more frequent episodes of palpitations in July 2017 with associated weakness and chest pain. 48 hour monitor July 2017 with PVCs, PACs, and several short runs of SVT. She is now on Eliquis. Cardiac cath 09/18/15 with mild CAD (10% mid RCA stenosis, 20% proximal LAD stenosis). Normal LV systolic function. Echo August 2017 with normal LV size and function, no valve disease. Cardizem was added and she has tolerated. She had a left foot ulcer. Normal ABI August 2021.    She is here today for follow up. The patient denies any chest pain, dyspnea, palpitations, lower extremity edema, orthopnea, PND, dizziness, near syncope or syncope.   Primary Care Physician: Rodrigo Ran, MD  Past Medical History:  Diagnosis Date   Atypical chest pain    a. 08/2015 Cath: LM nl, LAD 20ost, D1 small/nl, D2 mod, nl, RI small, nl, LCX nl, OM1 small, nl, OM2 mod nl, RCA 67m, EF 55-65%.   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 CHA2DS2VASc = 3-->Xarelto since 2015;  b. 08/2015 48h holter in setting of palpitations-->PVC's.   Palpitations    a. 08/2015 48h Holter:  pvc's.   Papillary carcinoma (HCC)    Post-menopausal    PVCs (premature ventricular contractions)    Shingles    Thyroid cancer (HCC)    s/p THYROIDECTOMY   Whooping cough    as a child    Past Surgical History:  Procedure Laterality Date    tarsal tunnel release  2002   Right/Left  tarsal tunnel release   CARDIAC CATHETERIZATION  04/21/07   GLOBAL ESTIMATED EF IS 60 %; NORMAL LV FUNCTION; NORMAL CORONARY ARTERIES; BORDERLINE INCREASE IN THE AORTIC ROOT   CARDIAC CATHETERIZATION N/A 09/18/2015   Procedure: Left Heart Cath and Coronary Angiography;  Surgeon: Kathleene Hazel, MD;  Location: [facility] INVASIVE CV LAB;  Service: Cardiovascular;  Laterality: N/A;   COLONOSCOPY     CYSTOSCOPY  2007   anterior repair of cystocele with mesh   IR ANGIOGRAM EXTREMITY LEFT  11/05/2019   IR RADIOLOGIST EVAL & MGMT  11/01/2019   IR US GUIDE VASC ACCESS LEFT  11/05/2019   MASTECTOMY  2000   LEFT BREAST   MELANOMA EXCISION  1991   REMOVED FROM LEFT LEG   NASAL SINUS SURGERY     THYROIDECTOMY      Current Outpatient Medications  Medication Sig Dispense Refill   acetaminophen (TYLENOL) 500 MG tablet Take 500-1,000 mg by mouth daily as needed  for mild pain or headache.     calcium citrate-vitamin D (CITRACAL+D) 315-200 MG-UNIT tablet Take 2 tablets by mouth 2 (two) times daily.     cetirizine (ZYRTEC) 10 MG tablet Take 10 mg by mouth as needed for allergies.     diltiazem (CARDIZEM CD) 120 MG 24 hr capsule TAKE ONE CAPSULE BY MOUTH DAILY 90 capsule 1   ELIQUIS 5 MG TABS tablet TAKE ONE TABLET BY MOUTH TWICE A DAY 60 tablet 5   galantamine (RAZADYNE ER) 24 MG 24 hr capsule Take 24 mg by mouth daily with breakfast.     Glucosamine-Chondroitin (OSTEO BI-FLEX REGULAR STRENGTH PO) Take 2 tablets by mouth daily after supper.     ibuprofen (ADVIL) 800 MG tablet Take 1 tablet (800 mg total) by mouth every 6 (six) hours as needed. 60 tablet 1   levothyroxine (SYNTHROID, LEVOTHROID) 150 MCG tablet Take 150  mcg by mouth See admin instructions. Take 150 mcg before breakfast Monday through Saturday. Skip dose on Sundays     memantine (NAMENDA) 5 MG tablet Take 5 mg by mouth 2 (two) times daily.     Misc Natural Products (OSTEO BI-FLEX ADV TRIPLE ST) TABS Take 2 tablets by mouth at bedtime.     Multiple Vitamin (MULTI-VITAMIN DAILY PO) Take 1 tablet by mouth daily.     Multiple Vitamins-Minerals (PRESERVISION AREDS) CAPS Take 1 capsule by mouth 2 (two) times daily.      nebivolol (BYSTOLIC) 5 MG tablet Take 2.5 mg by mouth at bedtime.     Omega-3 Fatty Acids (FISH OIL) 1200 MG CAPS Take 2,400 mg by mouth daily.     omeprazole (PRILOSEC) 20 MG capsule Take 20 mg by mouth daily.     potassium chloride SA (K-DUR,KLOR-CON) 20 MEQ tablet Take 40 mEq by mouth daily.      rosuvastatin (CRESTOR) 40 MG tablet TAKE ONE TABLET BY MOUTH ONCE A DAY 90 tablet 3   triamterene-hydrochlorothiazide (MAXZIDE-25) 37.5-25 MG tablet Take 1 tablet by mouth daily.     No current facility-administered medications for this visit.    Allergies  Allergen Reactions   Carbamazepine Hives, Rash and Other (See Comments)   Oxaprozin Hives, Rash and Other (See Comments)   Amoxicillin-Pot Clavulanate Diarrhea and Nausea Only   Ciprofibrate Other (See Comments)   Miralax [Polyethylene Glycol] Other (See Comments)   Oxycodone-Acetaminophen     Other reaction(s): Chills (intolerance)   Pedi-Pre Tape Spray [Wound Dressing Adhesive] Other (See Comments)   Prednisolone     Unknown reaction   Ramipril Other (See Comments)    Other Reaction(s): bad cough   Prednisone Itching and Other (See Comments)    leg swelling /dyuria     Social History   Socioeconomic History   Marital status: Married    Spouse name: Not on file   Number of children: Not on file   Years of education: Not on file   Highest education level: Not on file  Occupational History   Occupation: RETIRED    Employer: [employer]    Comment: [employer]  Tobacco Use   Smoking status: Never   Smokeless tobacco: Never  Vaping Use   Vaping status: Never Used  Substance and Sexual Activity   Alcohol use: Yes    Comment: RARE   Drug use: No   Sexual activity: Not on file  Other Topics Concern   Not on file  Social History Narrative   RETIRED FROM [employer]  LIVES @ HOME WITH HER HUSBAND   SHE HAS CO-AUTHORED BOOKS ON IV ADDITIVES   ETOH RARELY   NON-SMOKER         Social Drivers of Corporate investment banker Strain: Not on file  Food Insecurity: Not on file  Transportation Needs: Not on file  Physical Activity: Not on file  Stress: Not on file  Social Connections: Not on file  Intimate Partner Violence: Not on file    Family History  Problem Relation Age of Onset   Pancreatitis Mother    Heart disease Mother    Kidney disease Mother    Hypertension Father    Macular degeneration Father    Heart disease Father    Breast cancer Neg Hx    Colon cancer Neg Hx    Esophageal cancer Neg Hx    Pancreatic cancer Neg Hx    Stomach cancer Neg Hx     Review of Systems:  As stated in the HPI and otherwise negative.   BP 114/78   Pulse 66   Ht 5\' 10"  (1.778 m)   Wt 70.9 kg   SpO2 97%   BMI 22.44 kg/m   Physical Examination: General: Well developed, well nourished, NAD  HEENT: OP clear, mucus membranes moist  SKIN: warm, dry. No rashes. Neuro: No focal deficits  Musculoskeletal: Muscle strength 5/5 all ext  Psychiatric: Mood and affect normal  Neck: No JVD, no carotid bruits, no thyromegaly, no lymphadenopathy.  Lungs:Clear bilaterally, no wheezes, rhonci, crackles Cardiovascular: Regular rate and rhythm. No murmurs, gallops or rubs. Abdomen:Soft. Bowel sounds present. Non-tender.  Extremities: No lower extremity edema. Pulses are 2 + in the bilateral DP/PT.  EKG:  EKG is ordered today. The ekg ordered today demonstrates  EKG Interpretation Date/Time:  Friday May 13 2023 13:38:55  EDT Ventricular Rate:  60 PR Interval:  136 QRS Duration:  72 QT Interval:  410 QTC Calculation: 410 R Axis:   -39  Text Interpretation: Normal sinus rhythm Left axis deviation Poor R wave progression Confirmed by Verne Carrow [phone]) on 05/13/2023 1:41:08 PM   Recent Labs: 01/18/2023: Hemoglobin 12.2; Platelets 219   Lipid Panel    Component Value Date/Time   CHOL 152 07/03/2021 1148   TRIG 67 07/03/2021 1148   HDL 74 07/03/2021 1148   CHOLHDL 2.1 07/03/2021 1148   LDLCALC 65 07/03/2021 1148     Wt Readings from Last 3 Encounters:  05/13/23 70.9 kg  01/19/23 72 kg  01/18/23 72 kg    Assessment and Plan:   1. Atrial fibrillation, paroxysmal/PVCs/SVT: Rare palpitations. Sinus today. Continue Cardizem, beta blocker and Eliquis.        2.CAD without angina: Cath July 2017 with mild CAD. No chest pain. Continue statin and beta blocker. No ASA since she is on Eliquis.  Echo now to assess LV function.   3. Chronic diastolic CHF: Weight is stable. Rare LE edema.   4. Hyperlipidemia: Lipids followed in primary care. LDL was well controlled in 2023 in our office. She tells me that her last lipid profile was good in primary care. Continue Crestor.    Labs/ tests ordered today include:  Orders Placed This Encounter  Procedures   EKG 12-Lead   ECHOCARDIOGRAM COMPLETE   Disposition:   F/U with me in 12  months  Signed, Verne Carrow, MD 05/13/2023 2:21 PM    [facility] [address] [place], [place], [place]  [address] Phone: [phone]; Fax: [phone]

## 2023-06-07 DIAGNOSIS — H353211 Exudative age-related macular degeneration, right eye, with active choroidal neovascularization: Secondary | ICD-10-CM | POA: Diagnosis not present

## 2023-06-07 DIAGNOSIS — H353231 Exudative age-related macular degeneration, bilateral, with active choroidal neovascularization: Secondary | ICD-10-CM | POA: Diagnosis not present

## 2023-06-07 DIAGNOSIS — Z961 Presence of intraocular lens: Secondary | ICD-10-CM | POA: Diagnosis not present

## 2023-06-07 DIAGNOSIS — H43813 Vitreous degeneration, bilateral: Secondary | ICD-10-CM | POA: Diagnosis not present

## 2023-06-08 DIAGNOSIS — H353211 Exudative age-related macular degeneration, right eye, with active choroidal neovascularization: Secondary | ICD-10-CM | POA: Diagnosis not present

## 2023-06-10 ENCOUNTER — Ambulatory Visit (HOSPITAL_COMMUNITY): Attending: Cardiology

## 2023-06-10 DIAGNOSIS — I2510 Atherosclerotic heart disease of native coronary artery without angina pectoris: Secondary | ICD-10-CM | POA: Diagnosis not present

## 2023-06-10 LAB — ECHOCARDIOGRAM COMPLETE
Area-P 1/2: 4.6 cm2
S' Lateral: 2.6 cm

## 2023-06-13 DIAGNOSIS — H353211 Exudative age-related macular degeneration, right eye, with active choroidal neovascularization: Secondary | ICD-10-CM | POA: Diagnosis not present

## 2023-06-17 ENCOUNTER — Other Ambulatory Visit: Payer: Self-pay | Admitting: Cardiovascular Disease

## 2023-06-27 DIAGNOSIS — I10 Essential (primary) hypertension: Secondary | ICD-10-CM | POA: Diagnosis not present

## 2023-06-27 DIAGNOSIS — I480 Paroxysmal atrial fibrillation: Secondary | ICD-10-CM | POA: Diagnosis not present

## 2023-06-27 DIAGNOSIS — E039 Hypothyroidism, unspecified: Secondary | ICD-10-CM | POA: Diagnosis not present

## 2023-07-11 DIAGNOSIS — D6869 Other thrombophilia: Secondary | ICD-10-CM | POA: Diagnosis not present

## 2023-07-11 DIAGNOSIS — Z7901 Long term (current) use of anticoagulants: Secondary | ICD-10-CM | POA: Diagnosis not present

## 2023-07-25 DIAGNOSIS — M8589 Other specified disorders of bone density and structure, multiple sites: Secondary | ICD-10-CM | POA: Diagnosis not present

## 2023-08-11 ENCOUNTER — Telehealth: Payer: Self-pay | Admitting: Podiatry

## 2023-08-11 ENCOUNTER — Ambulatory Visit: Admitting: Podiatry

## 2023-08-11 ENCOUNTER — Encounter: Payer: Self-pay | Admitting: Podiatry

## 2023-08-11 ENCOUNTER — Ambulatory Visit (INDEPENDENT_AMBULATORY_CARE_PROVIDER_SITE_OTHER)

## 2023-08-11 DIAGNOSIS — I739 Peripheral vascular disease, unspecified: Secondary | ICD-10-CM

## 2023-08-11 DIAGNOSIS — L97522 Non-pressure chronic ulcer of other part of left foot with fat layer exposed: Secondary | ICD-10-CM | POA: Diagnosis not present

## 2023-08-11 DIAGNOSIS — L02619 Cutaneous abscess of unspecified foot: Secondary | ICD-10-CM

## 2023-08-11 DIAGNOSIS — L03116 Cellulitis of left lower limb: Secondary | ICD-10-CM

## 2023-08-11 MED ORDER — SULFAMETHOXAZOLE-TRIMETHOPRIM 800-160 MG PO TABS: 1.0000 | ORAL_TABLET | Freq: Two times a day (BID) | ORAL | 1 refills | Status: DC

## 2023-08-11 NOTE — Telephone Encounter (Signed)
 Patients husband called in regards to antibiotic patient was prescribed at today's visit. The pharmacy is stating that they don't have it. Can we resend the prescription too [facility]? Thanks

## 2023-08-11 NOTE — Progress Notes (Signed)
 Chief Complaint  Patient presents with   Foot Pain    Left foot pain  Pts husband stated that she had some swelling that started last week and she had some discomfort on her 4th toe he stated that it was just a black spot but it has gotten worse, some leakage between the toe has a slight odor coming from the foot. She is not a diabetic.     HPI: 83 y.o. female presenting today with concern for new wounds and infection to the left foot.  She reports noticing pain, redness and blister formation nearly 1 week ago.  She denies nausea, vomiting, fever, chills, chest pain, shortness of breath.  She does have history of PVD and history of lower extremity angiogram.  Past Medical History:  Diagnosis Date   Atypical chest pain    a. 08/2015 Cath: LM nl, LAD 20ost, D1 small/nl, D2 mod, nl, RI small, nl, LCX nl, OM1 small, nl, OM2 mod nl, RCA 17m, EF 55-65%.   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 CHA2DS2VASc = 3-->Xarelto  since 2015;  b. 08/2015 48h holter in setting of palpitations-->PVC's.   Palpitations    a. 08/2015 48h Holter: pvc's.   Papillary carcinoma (HCC)    Post-menopausal    PVCs (premature ventricular contractions)    Shingles    Thyroid  cancer (HCC)    s/p THYROIDECTOMY   Whooping cough    as a child   Past Surgical History:  Procedure Laterality Date    tarsal tunnel release  2002   Right/Left  tarsal tunnel release   CARDIAC CATHETERIZATION  04/21/07   GLOBAL ESTIMATED EF IS 60 %; NORMAL LV FUNCTION; NORMAL CORONARY ARTERIES; BORDERLINE INCREASE IN THE AORTIC ROOT   CARDIAC CATHETERIZATION N/A 09/18/2015    Procedure: Left Heart Cath and Coronary Angiography;  Surgeon: Lonni JONETTA Cash, MD;  Location: [facility] INVASIVE CV LAB;  Service: Cardiovascular;  Laterality: N/A;   COLONOSCOPY     CYSTOSCOPY  2007   anterior repair of cystocele with mesh   IR ANGIOGRAM EXTREMITY LEFT  11/05/2019   IR RADIOLOGIST EVAL & MGMT  11/01/2019   IR US  GUIDE VASC ACCESS LEFT  11/05/2019   MASTECTOMY  2000   LEFT BREAST   MELANOMA EXCISION  1991   REMOVED FROM LEFT LEG   NASAL SINUS SURGERY     THYROIDECTOMY     Allergies  Allergen Reactions   Carbamazepine Hives, Rash and Other (See Comments)   Oxaprozin Hives, Rash and Other (See Comments)   Amoxicillin -Pot Clavulanate Diarrhea and Nausea Only   Ciprofibrate Other (See Comments)   Miralax [Polyethylene Glycol] Other (See Comments)   Oxycodone -Acetaminophen      Other reaction(s): Chills (intolerance)   Pedi-Pre Tape Spray [Wound Dressing Adhesive] Other (See Comments)   Prednisolone     Unknown reaction   Ramipril Other (See Comments)    Other Reaction(s): bad cough   Prednisone Itching and Other (See Comments)    leg swelling /dyuria  PHYSICAL EXAM: There were no vitals filed for this visit.  General: The patient is alert and oriented x3 in no acute distress.  Dermatology: Blister formation left subthird metatarsal head area with blister extending towards the fourth toe medial and lateral aspects.    Wound 1:  Location: Left subthird metatarsal head        Depth: Subcutaneous tissue        Wound Border: Macerated, hyperkeratotic        Wound Base: Does have granulation tissue to the wound bed following debridement, overlying hemorrhagic callus.        Drainage: Scant serous       Odor?:  Macerated, musky odor        Surrounding Tissue: Some periwound erythema        Infected?:  Appears cellulitic        Necrosis?:  Necrosis of skin and dermal layers.        Pain?:  Painful        Tunneling: Separated skin layers and blistering extending  to the left fourth toe medial lateral aspects with dermal tissue involvement       Dimensions (cm): predebridement overlying hemorrhagic callus and blister, postdebridement 2 x 1.6 x 0.3 cm with area that does track towards the fourth toe sulcus         Vascular: Pedal pulses are faintly palpable, difficult to palpate due to edema.  Capillary refill intact to the digits.  Decreased pedal hair growth.  Neurological: Light touch sensation decreased to the digits  Musculoskeletal Exam: Left foot prior fourth metatarsal head excision, fifth metatarsal floating osteotomy.  Prominent third metatarsal left foot.      No data to display           RADIOGRAPHIC EXAM: Left foot 3 views weightbearing 08/11/2023 Prior floating fifth metatarsal head osteotomy healed, evidence of prior fourth metatarsal head excision with heterotopic ossification to the adjacent third metatarsal is noted.  There is no osteolysis or cortical erosion correlating with the sites of ulceration.  ASSESSMENT / PLAN OF CARE: 1. Ulcer of left foot with fat layer exposed (HCC)   2. Cellulitis and abscess of foot, except toes   3. PVD (peripheral vascular disease) (HCC)      Meds ordered this encounter  Medications   DISCONTD: sulfamethoxazole -trimethoprim  (BACTRIM  DS) 800-160 MG tablet    Sig: Take 1 tablet by mouth 2 (two) times daily.    Dispense:  20 tablet    Refill:  1   DISCONTD: sulfamethoxazole -trimethoprim  (BACTRIM  DS) 800-160 MG tablet    Sig: Take 1 tablet by mouth 2 (two) times daily.    Dispense:  20 tablet    Refill:  1   DISCONTD: sulfamethoxazole -trimethoprim  (BACTRIM  DS) 800-160 MG tablet    Sig: Take 1 tablet by mouth 2 (two) times daily.    Dispense:  20 tablet    Refill:  1   DISCONTD: sulfamethoxazole -trimethoprim  (BACTRIM  DS) 800-160 MG tablet    Sig: Take 1 tablet by mouth 2 (two) times daily.    Dispense:  20 tablet    Refill:  1   sulfamethoxazole -trimethoprim  (BACTRIM  DS) 800-160 MG  tablet    Sig: Take 1 tablet by mouth 2 (two) times daily.    Dispense:  20 tablet    Refill:  1   DG FOOT COMPLETE LEFT VAS US  ABI WITH/WO TBI  Radiographs reviewed with patient Arterial ABI with toe pressure ordered due to history of PAD, she has had  prior revascularization.  No wound cultures were taken due to lack of significant drainage.  The ulceration was sharply debrided of hyperkeratotic and devitalized soft tissue with sterile #312 blade to the level of subcutaneous tissue.  Hemostasis obtained.  Betadine wet-to-dry dressings applied.  Reviewed off-loading with patient.  Wound measurements as described above.  Did deroofed sloughing skin to the adjacent aspects of the fourth toe today using tissue nipper.  Surgical shoe dispensed for patient to wear at all times WB.  Advised limited weightbearing is much as possible, she does have home knee scooter.  Starting patient on course of oral Bactrim  for the left foot cellulitis.  Reviewed daily dressing changes with patient.  Discussed risks / concerns regarding ulcer with patient and possible sequelae if left untreated.  Stressed importance of infection prevention at home. Short-term goals are: prevent infection, off-load ulcer, heal ulcer Long-term goals are:  prevent recurrence, prevent amputation.   Return in about 2 weeks (around 08/25/2023) for Wound Care.   Darely Becknell L. Lamount MAUL, AACFAS [facility]     [address] [place], [place] [address]                Office [phone]  Fax [phone]

## 2023-08-12 MED ORDER — SULFAMETHOXAZOLE-TRIMETHOPRIM 800-160 MG PO TABS: 1.0000 | ORAL_TABLET | Freq: Two times a day (BID) | ORAL | 1 refills | Status: DC

## 2023-08-12 MED ORDER — SULFAMETHOXAZOLE-TRIMETHOPRIM 800-160 MG PO TABS: 1.0000 | ORAL_TABLET | Freq: Two times a day (BID) | ORAL | 1 refills | Status: AC

## 2023-08-12 NOTE — Telephone Encounter (Signed)
 The patient has experienced infection in the past with this type of procedure.Husband is concerned and is requesting an antibiotic for preventative measures. [facility] - [place], [place] - [address] Phone: [phone]  Fax: [phone]

## 2023-08-19 ENCOUNTER — Telehealth: Payer: Self-pay | Admitting: Podiatry

## 2023-08-19 NOTE — Telephone Encounter (Signed)
 Spouse called to inform provider, pharmacy stated order has not been called in for refill of Sulfamethoxazole -Trimethoprim  (BACTRIM  DS) for patient. Spouse would like to know after 8 days can patient take shower

## 2023-08-25 ENCOUNTER — Ambulatory Visit (INDEPENDENT_AMBULATORY_CARE_PROVIDER_SITE_OTHER): Payer: Self-pay | Admitting: Podiatry

## 2023-08-25 ENCOUNTER — Encounter: Payer: Self-pay | Admitting: Podiatry

## 2023-08-25 DIAGNOSIS — I739 Peripheral vascular disease, unspecified: Secondary | ICD-10-CM

## 2023-08-25 DIAGNOSIS — L97522 Non-pressure chronic ulcer of other part of left foot with fat layer exposed: Secondary | ICD-10-CM

## 2023-08-25 NOTE — Progress Notes (Signed)
 Chief Complaint  Patient presents with   Foot Ulcer     Ulcer of left foot with fat layer exposed. 2 pain. Non diabetic.       HPI: 83 y.o. female presenting today following up for left subthird metatarsal head ulceration.  She completed antibiotics without incident.  Wound seems to be healing well.  They state they are unsure about any upcoming vascular testing, does appear they have appointment with [facility] vascular center coming up next week on July 8.  Past Medical History:  Diagnosis Date   Atypical chest pain    a. 08/2015 Cath: LM nl, LAD 20ost, D1 small/nl, D2 mod, nl, RI small, nl, LCX nl, OM1 small, nl, OM2 mod nl, RCA 90m, EF 55-65%.   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 CHA2DS2VASc = 3-->Xarelto  since 2015;  b. 08/2015 48h holter in setting of palpitations-->PVC's.   Palpitations    a. 08/2015 48h Holter: pvc's.   Papillary carcinoma (HCC)    Post-menopausal    PVCs (premature ventricular contractions)    Shingles    Thyroid  cancer (HCC)    s/p THYROIDECTOMY   Whooping cough    as a child   Past Surgical History:  Procedure Laterality Date    tarsal tunnel release  2002   Right/Left  tarsal tunnel release   CARDIAC CATHETERIZATION  04/21/07   GLOBAL ESTIMATED EF IS 60 %; NORMAL LV FUNCTION; NORMAL CORONARY ARTERIES; BORDERLINE INCREASE IN THE AORTIC ROOT   CARDIAC CATHETERIZATION N/A 09/18/2015   Procedure: Left Heart Cath and Coronary Angiography;  Surgeon: Lonni JONETTA Cash, MD;  Location: [facility] INVASIVE CV LAB;  Service: Cardiovascular;  Laterality: N/A;   COLONOSCOPY     CYSTOSCOPY  2007    anterior repair of cystocele with mesh   IR ANGIOGRAM EXTREMITY LEFT  11/05/2019   IR RADIOLOGIST EVAL & MGMT  11/01/2019   IR US  GUIDE VASC ACCESS LEFT  11/05/2019   MASTECTOMY  2000   LEFT BREAST   MELANOMA EXCISION  1991   REMOVED FROM LEFT LEG   NASAL SINUS SURGERY     THYROIDECTOMY     Allergies  Allergen Reactions   Carbamazepine Hives, Rash and Other (See Comments)   Oxaprozin Hives, Rash and Other (See Comments)   Amoxicillin -Pot Clavulanate Diarrhea and Nausea Only   Ciprofibrate Other (See Comments)   Miralax [Polyethylene Glycol] Other (See Comments)   Oxycodone -Acetaminophen      Other reaction(s): Chills (intolerance)   Pedi-Pre Tape Spray [Wound Dressing Adhesive] Other (See Comments)   Prednisolone     Unknown reaction   Ramipril Other (See Comments)    Other Reaction(s): bad cough   Prednisone Itching and Other (See Comments)    leg swelling /dyuria      PHYSICAL EXAM: There were no vitals filed for this visit.  General: The patient is alert and oriented x3 in no acute distress.  Dermatology: Good wound healing noted left subthird metatarsal head area.  The area of ulceration  has been covered with hemorrhagic callus at this point.  Postdebridement measurements 0.4 x 0.3 x 0.3 cm with fat layer exposed.  There is significant hyperkeratotic tissue the periwound area extending for the fourth toe at site of previous blister.  No cellulitis.  No signs of infection.  Vascular: Pedal pulses are faintly palpable, difficult to palpate due to edema.  Capillary refill intact to the digits.  Decreased pedal hair growth.  Neurological: Light touch sensation decreased to the digits  Musculoskeletal Exam: Left foot prior fourth metatarsal head excision, fifth metatarsal floating osteotomy.  Prominent third metatarsal left foot.      No data to display             ASSESSMENT / PLAN OF CARE: 1. PVD (peripheral vascular disease) (HCC)   2. Ulcer of left foot with  fat layer exposed (HCC)      No orders of the defined types were placed in this encounter.  None  Noninvasive vascular studies and vascular consult pending.  No wound cultures were taken due to lack of significant drainage.  The ulceration was sharply debrided of hyperkeratotic and devitalized soft tissue with sterile #312 blade to the level of subcutaneous tissue.  Hemostasis obtained.  Hydrofera Blue and bordered foam dressing applied with light Coban to secure dressing in place.  Postdebridement measurements as described above.  He has done well with limited weightbearing.  Continue use of the surgical shoe.  No further antibiotics at this point.  Placing order for Aquacel Ag and bordered foam dressings along with wound cleanser to be ordered for the patient.  May change dressings every 2 to 3 days.  Recommend scrubbing the area of hyperkeratotic tissue around the wound with white vinegar when performing dressing changes taking care to avoid getting it into the wound.  Instructed patient against soaking the foot or getting it wet in the shower.  Discussed risks / concerns regarding ulcer with patient and possible sequelae if left untreated.  Stressed importance of infection prevention at home. Short-term goals are: prevent infection, off-load ulcer, heal ulcer Long-term goals are:  prevent recurrence, prevent amputation.   Return in about 2 weeks (around 09/08/2023) for Wound Care.   Anella Nakata L. Lamount MAUL, AACFAS [facility]     [address] [place], [place] [address]                Office [phone]  Fax [phone]

## 2023-08-25 NOTE — Patient Instructions (Addendum)
 He can change the dressing every 2 to 3 days.  We are ordering you supplies for wound care.  Will order Aquacel Ag and bordered foam dressings.  Cleanse the wound with wound cleanser during dressing changes.  You can also scrub the surrounding dry callused skin with white vinegar to help soften these areas.  Continue protected weightbearing in surgical shoe.  Avoid soaking the foot or getting it wet in the shower.  You may be able to find a good cast bag from [facility] which can help protect the foot and getting it wet in the shower.  I have ordered non invasive vascular studies for you. If you do not hear for them about scheduling within the next 1 week, or you have any questions please give us  a call at [phone].

## 2023-08-30 ENCOUNTER — Telehealth: Payer: Self-pay

## 2023-08-30 ENCOUNTER — Ambulatory Visit (HOSPITAL_COMMUNITY)
Admission: RE | Admit: 2023-08-30 | Discharge: 2023-08-30 | Disposition: A | Discharge status: Home / Self Care | Source: Ambulatory Visit | Attending: Podiatry | Admitting: Podiatry

## 2023-08-30 DIAGNOSIS — S91301A Unspecified open wound, right foot, initial encounter: Secondary | ICD-10-CM | POA: Diagnosis not present

## 2023-08-30 DIAGNOSIS — I739 Peripheral vascular disease, unspecified: Secondary | ICD-10-CM | POA: Insufficient documentation

## 2023-08-30 LAB — VAS US ABI WITH/WO TBI
Left ABI: 1.15
Right ABI: 1.13

## 2023-08-30 NOTE — Telephone Encounter (Signed)
 Order form, office note and demographics faxed to [facility]   [phone]

## 2023-08-30 NOTE — Telephone Encounter (Signed)
-----   Message from Ethan LITTIE Saddler sent at 08/25/2023 10:07 AM EDT ----- Regarding: Dressing supplies Hello Christina [name],  I would like to order wound care supplies for this patient to be changed every 2 to 3 days.  Right subthird metatarsal head ulcer measuring 0.4 x 0.3 x 0.3 cm following debridement today.  Aquacel Ag, bordered foam dressing, wound cleanser.  Thank you

## 2023-09-05 ENCOUNTER — Other Ambulatory Visit: Payer: Self-pay | Admitting: Cardiovascular Disease

## 2023-09-05 DIAGNOSIS — I480 Paroxysmal atrial fibrillation: Secondary | ICD-10-CM

## 2023-09-05 NOTE — Telephone Encounter (Signed)
 Prescription refill request for Eliquis  received. Indication:afib Last office visit:3/25 Scr:1.02  10/24 Age: 83 Weight:70.9  kg  Prescription refilled

## 2023-09-07 DIAGNOSIS — E039 Hypothyroidism, unspecified: Secondary | ICD-10-CM | POA: Diagnosis not present

## 2023-09-09 ENCOUNTER — Encounter: Payer: Self-pay | Admitting: Podiatry

## 2023-09-09 ENCOUNTER — Ambulatory Visit (INDEPENDENT_AMBULATORY_CARE_PROVIDER_SITE_OTHER): Payer: Self-pay | Admitting: Podiatry

## 2023-09-09 DIAGNOSIS — M21962 Unspecified acquired deformity of left lower leg: Secondary | ICD-10-CM | POA: Diagnosis not present

## 2023-09-09 NOTE — Progress Notes (Signed)
 Chief Complaint  Patient presents with   Wound Check    Patient states that everything has been ok since last visit no pain    HPI: 83 y.o. female presenting today following up for left subthird metatarsal head ulceration.  She seems to be healing very well.  Denies pain.  Did have ABI testing which showed no decreased circulation.  Past Medical History:  Diagnosis Date   Atypical chest pain    a. 08/2015 Cath: LM nl, LAD 20ost, D1 small/nl, D2 mod, nl, RI small, nl, LCX nl, OM1 small, nl, OM2 mod nl, RCA 33m, EF 55-65%.   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 CHA2DS2VASc = 3-->Xarelto  since 2015;  b. 08/2015 48h holter in setting of palpitations-->PVC's.   Palpitations    a. 08/2015 48h Holter: pvc's.   Papillary carcinoma (HCC)    Post-menopausal    PVCs (premature ventricular contractions)    Shingles    Thyroid  cancer (HCC)    s/p THYROIDECTOMY   Whooping cough    as a child   Past Surgical History:  Procedure Laterality Date    tarsal tunnel release  2002   Right/Left  tarsal tunnel release   CARDIAC CATHETERIZATION  04/21/07   GLOBAL ESTIMATED EF IS 60 %; NORMAL LV FUNCTION; NORMAL CORONARY ARTERIES; BORDERLINE INCREASE IN THE AORTIC ROOT   CARDIAC CATHETERIZATION N/A 09/18/2015   Procedure: Left Heart Cath and Coronary Angiography;  Surgeon: Lonni JONETTA Cash, MD;  Location: [facility] INVASIVE CV LAB;  Service: Cardiovascular;  Laterality: N/A;   COLONOSCOPY     CYSTOSCOPY  2007   anterior repair of cystocele with mesh   IR ANGIOGRAM EXTREMITY LEFT  11/05/2019   IR RADIOLOGIST EVAL & MGMT  11/01/2019   IR US   GUIDE VASC ACCESS LEFT  11/05/2019   MASTECTOMY  2000   LEFT BREAST   MELANOMA EXCISION  1991   REMOVED FROM LEFT LEG   NASAL SINUS SURGERY     THYROIDECTOMY     Allergies  Allergen Reactions   Carbamazepine Hives, Rash and Other (See Comments)   Oxaprozin Hives, Rash and Other (See Comments)   Amoxicillin -Pot Clavulanate Diarrhea and Nausea Only   Ciprofibrate Other (See Comments)   Miralax [Polyethylene Glycol] Other (See Comments)   Oxycodone -Acetaminophen      Other reaction(s): Chills (intolerance)   Pedi-Pre Tape Spray [Wound Dressing Adhesive] Other (See Comments)   Prednisolone     Unknown reaction   Ramipril Other (See Comments)    Other Reaction(s): bad cough   Prednisone Itching and Other (See Comments)    leg swelling /dyuria      PHYSICAL EXAM: There were no vitals filed for this visit.  General: The patient is alert and oriented x3 in no acute distress.  Dermatology: Good wound healing noted left forefoot ulceration.  There is some hemorrhagic callus present, this was pared down and underlying ulceration appears to have healed with new epithelialization.  Vascular: Pedal pulses are faintly palpable, difficult to palpate  due to edema.  Capillary refill intact to the digits.  Decreased pedal hair growth.  Neurological: Light touch sensation decreased to the digits  Musculoskeletal Exam: Left foot prior fourth metatarsal head excision, fifth metatarsal floating osteotomy.  Prominent third metatarsal left foot.      No data to display             ASSESSMENT / PLAN OF CARE: 1. Metatarsal deformity, left      No orders of the defined types were placed in this encounter.  None  Did pare down hemorrhagic callus at site of prior ulceration site left forefoot as a courtesy today, ulceration appears to have healed at this point  Metatarsal pad was applied to offload the area.  Advise close monitoring for signs of recurrence.  Did review results of the  patient's ABI testing.  She did have ABI values within normal limits despite her swelling.  I do think she should follow-up with Dr. Tobie who did her prior surgeries to see if there are further recommendations to prevent reulceration  Return in about 6 weeks (around 10/21/2023) for forefoot deformity/ulcer check.   Destenie Ingber L. Lamount MAUL, AACFAS [facility]     [address] [place], [place] [address]                Office [phone]  Fax [phone]

## 2023-09-10 ENCOUNTER — Encounter: Payer: Self-pay | Admitting: Podiatry

## 2023-09-13 DIAGNOSIS — H353211 Exudative age-related macular degeneration, right eye, with active choroidal neovascularization: Secondary | ICD-10-CM | POA: Diagnosis not present

## 2023-10-05 DIAGNOSIS — K080 Exfoliation of teeth due to systemic causes: Secondary | ICD-10-CM | POA: Diagnosis not present

## 2023-10-19 ENCOUNTER — Ambulatory Visit: Admitting: Podiatry

## 2023-11-15 DIAGNOSIS — D1801 Hemangioma of skin and subcutaneous tissue: Secondary | ICD-10-CM | POA: Diagnosis not present

## 2023-11-15 DIAGNOSIS — Z85820 Personal history of malignant melanoma of skin: Secondary | ICD-10-CM | POA: Diagnosis not present

## 2023-11-15 DIAGNOSIS — D692 Other nonthrombocytopenic purpura: Secondary | ICD-10-CM | POA: Diagnosis not present

## 2023-11-15 DIAGNOSIS — Z85828 Personal history of other malignant neoplasm of skin: Secondary | ICD-10-CM | POA: Diagnosis not present

## 2023-11-15 DIAGNOSIS — L570 Actinic keratosis: Secondary | ICD-10-CM | POA: Diagnosis not present

## 2023-11-22 DIAGNOSIS — L84 Corns and callosities: Secondary | ICD-10-CM | POA: Diagnosis not present

## 2023-11-22 DIAGNOSIS — G6282 Radiation-induced polyneuropathy: Secondary | ICD-10-CM | POA: Diagnosis not present

## 2023-11-22 DIAGNOSIS — M79672 Pain in left foot: Secondary | ICD-10-CM | POA: Diagnosis not present

## 2023-12-06 DIAGNOSIS — M79672 Pain in left foot: Secondary | ICD-10-CM | POA: Diagnosis not present

## 2023-12-06 DIAGNOSIS — G6282 Radiation-induced polyneuropathy: Secondary | ICD-10-CM | POA: Diagnosis not present

## 2023-12-06 DIAGNOSIS — L84 Corns and callosities: Secondary | ICD-10-CM | POA: Diagnosis not present

## 2023-12-16 DIAGNOSIS — H353211 Exudative age-related macular degeneration, right eye, with active choroidal neovascularization: Secondary | ICD-10-CM | POA: Diagnosis not present

## 2023-12-30 ENCOUNTER — Other Ambulatory Visit: Payer: Self-pay | Admitting: Internal Medicine

## 2023-12-30 DIAGNOSIS — Z1231 Encounter for screening mammogram for malignant neoplasm of breast: Secondary | ICD-10-CM

## 2024-01-03 DIAGNOSIS — L84 Corns and callosities: Secondary | ICD-10-CM | POA: Diagnosis not present

## 2024-01-03 DIAGNOSIS — G6282 Radiation-induced polyneuropathy: Secondary | ICD-10-CM | POA: Diagnosis not present

## 2024-01-03 DIAGNOSIS — M79672 Pain in left foot: Secondary | ICD-10-CM | POA: Diagnosis not present

## 2024-02-03 ENCOUNTER — Ambulatory Visit
Admission: RE | Admit: 2024-02-03 | Discharge: 2024-02-03 | Disposition: A | Source: Ambulatory Visit | Attending: Internal Medicine | Admitting: Internal Medicine

## 2024-02-03 DIAGNOSIS — Z1231 Encounter for screening mammogram for malignant neoplasm of breast: Secondary | ICD-10-CM | POA: Diagnosis not present

## 2024-03-05 ENCOUNTER — Other Ambulatory Visit: Payer: Self-pay | Admitting: Cardiovascular Disease

## 2024-03-05 DIAGNOSIS — I480 Paroxysmal atrial fibrillation: Secondary | ICD-10-CM
